# Patient Record
Sex: Female | Born: 1940 | Race: White | Hispanic: No | Marital: Married | Smoking: Former smoker
Health system: Southern US, Community
[De-identification: ages and names within clinical notes are randomized; demographics above are authoritative.]

## PROBLEM LIST (undated history)

## (undated) DIAGNOSIS — Z923 Personal history of irradiation: Secondary | ICD-10-CM

## (undated) DIAGNOSIS — Z803 Family history of malignant neoplasm of breast: Secondary | ICD-10-CM

## (undated) DIAGNOSIS — K219 Gastro-esophageal reflux disease without esophagitis: Secondary | ICD-10-CM

## (undated) DIAGNOSIS — C801 Malignant (primary) neoplasm, unspecified: Secondary | ICD-10-CM

## (undated) DIAGNOSIS — Z808 Family history of malignant neoplasm of other organs or systems: Secondary | ICD-10-CM

## (undated) DIAGNOSIS — M2550 Pain in unspecified joint: Secondary | ICD-10-CM

## (undated) DIAGNOSIS — M1990 Unspecified osteoarthritis, unspecified site: Secondary | ICD-10-CM

## (undated) DIAGNOSIS — Z8042 Family history of malignant neoplasm of prostate: Secondary | ICD-10-CM

## (undated) DIAGNOSIS — T8859XA Other complications of anesthesia, initial encounter: Secondary | ICD-10-CM

## (undated) DIAGNOSIS — M79606 Pain in leg, unspecified: Secondary | ICD-10-CM

## (undated) DIAGNOSIS — R112 Nausea with vomiting, unspecified: Secondary | ICD-10-CM

## (undated) DIAGNOSIS — H547 Unspecified visual loss: Secondary | ICD-10-CM

## (undated) DIAGNOSIS — R635 Abnormal weight gain: Secondary | ICD-10-CM

## (undated) DIAGNOSIS — I8390 Asymptomatic varicose veins of unspecified lower extremity: Secondary | ICD-10-CM

## (undated) DIAGNOSIS — IMO0001 Reserved for inherently not codable concepts without codable children: Secondary | ICD-10-CM

## (undated) DIAGNOSIS — H9190 Unspecified hearing loss, unspecified ear: Secondary | ICD-10-CM

## (undated) DIAGNOSIS — I10 Essential (primary) hypertension: Secondary | ICD-10-CM

## (undated) DIAGNOSIS — Z9889 Other specified postprocedural states: Secondary | ICD-10-CM

## (undated) DIAGNOSIS — Z800 Family history of malignant neoplasm of digestive organs: Secondary | ICD-10-CM

## (undated) DIAGNOSIS — E785 Hyperlipidemia, unspecified: Secondary | ICD-10-CM

## (undated) DIAGNOSIS — C50919 Malignant neoplasm of unspecified site of unspecified female breast: Secondary | ICD-10-CM

## (undated) DIAGNOSIS — T4145XA Adverse effect of unspecified anesthetic, initial encounter: Secondary | ICD-10-CM

## (undated) DIAGNOSIS — C50512 Malignant neoplasm of lower-outer quadrant of left female breast: Principal | ICD-10-CM

## (undated) HISTORY — DX: Abnormal weight gain: R63.5

## (undated) HISTORY — PX: ABDOMINAL HYSTERECTOMY: SHX81

## (undated) HISTORY — DX: Family history of malignant neoplasm of prostate: Z80.42

## (undated) HISTORY — DX: Unspecified visual loss: H54.7

## (undated) HISTORY — DX: Malignant neoplasm of unspecified site of unspecified female breast: C50.919

## (undated) HISTORY — DX: Gastro-esophageal reflux disease without esophagitis: K21.9

## (undated) HISTORY — DX: Essential (primary) hypertension: I10

## (undated) HISTORY — DX: Pain in leg, unspecified: M79.606

## (undated) HISTORY — DX: Reserved for inherently not codable concepts without codable children: IMO0001

## (undated) HISTORY — DX: Pain in unspecified joint: M25.50

## (undated) HISTORY — PX: SHOULDER SURGERY: SHX246

## (undated) HISTORY — DX: Asymptomatic varicose veins of unspecified lower extremity: I83.90

## (undated) HISTORY — DX: Family history of malignant neoplasm of other organs or systems: Z80.8

## (undated) HISTORY — DX: Malignant neoplasm of lower-outer quadrant of left female breast: C50.512

## (undated) HISTORY — PX: TONSILLECTOMY: SUR1361

## (undated) HISTORY — DX: Unspecified hearing loss, unspecified ear: H91.90

## (undated) HISTORY — PX: BLADDER SURGERY: SHX569

## (undated) HISTORY — PX: BREAST EXCISIONAL BIOPSY: SUR124

## (undated) HISTORY — DX: Hyperlipidemia, unspecified: E78.5

## (undated) HISTORY — DX: Family history of malignant neoplasm of digestive organs: Z80.0

## (undated) HISTORY — PX: CHOLECYSTECTOMY: SHX55

## (undated) HISTORY — DX: Family history of malignant neoplasm of breast: Z80.3

## (undated) HISTORY — PX: OTHER SURGICAL HISTORY: SHX169

## (undated) HISTORY — PX: BREAST SURGERY: SHX581

---

## 1998-03-14 ENCOUNTER — Inpatient Hospital Stay (HOSPITAL_COMMUNITY): Admission: EM | Admit: 1998-03-14 | Discharge: 1998-03-15 | Payer: Self-pay | Admitting: Internal Medicine

## 1998-05-20 ENCOUNTER — Ambulatory Visit: Admission: RE | Admit: 1998-05-20 | Discharge: 1998-05-20 | Payer: Self-pay | Admitting: Gynecology

## 1998-10-22 ENCOUNTER — Ambulatory Visit: Admission: RE | Admit: 1998-10-22 | Discharge: 1998-10-22 | Payer: Self-pay | Admitting: Gynecology

## 1999-01-05 ENCOUNTER — Other Ambulatory Visit: Admission: RE | Admit: 1999-01-05 | Discharge: 1999-01-05 | Payer: Self-pay | Admitting: Obstetrics & Gynecology

## 1999-04-14 ENCOUNTER — Ambulatory Visit: Admission: RE | Admit: 1999-04-14 | Discharge: 1999-04-14 | Payer: Self-pay | Admitting: Gynecology

## 1999-07-01 ENCOUNTER — Emergency Department (HOSPITAL_COMMUNITY): Admission: EM | Admit: 1999-07-01 | Discharge: 1999-07-01 | Payer: Self-pay | Admitting: Emergency Medicine

## 1999-07-08 ENCOUNTER — Ambulatory Visit: Admission: RE | Admit: 1999-07-08 | Discharge: 1999-07-08 | Payer: Self-pay | Admitting: Gynecology

## 1999-12-30 ENCOUNTER — Encounter: Payer: Self-pay | Admitting: Obstetrics & Gynecology

## 1999-12-30 ENCOUNTER — Encounter: Admission: RE | Admit: 1999-12-30 | Discharge: 1999-12-30 | Payer: Self-pay | Admitting: Obstetrics & Gynecology

## 2000-01-14 ENCOUNTER — Other Ambulatory Visit: Admission: RE | Admit: 2000-01-14 | Discharge: 2000-01-14 | Payer: Self-pay | Admitting: Obstetrics & Gynecology

## 2000-07-06 ENCOUNTER — Ambulatory Visit: Admission: RE | Admit: 2000-07-06 | Discharge: 2000-07-06 | Payer: Self-pay | Admitting: Gynecology

## 2001-02-07 ENCOUNTER — Other Ambulatory Visit: Admission: RE | Admit: 2001-02-07 | Discharge: 2001-02-07 | Payer: Self-pay | Admitting: Obstetrics & Gynecology

## 2001-07-12 ENCOUNTER — Ambulatory Visit: Admission: RE | Admit: 2001-07-12 | Discharge: 2001-07-12 | Payer: Self-pay | Admitting: Gynecology

## 2001-10-03 ENCOUNTER — Ambulatory Visit (HOSPITAL_BASED_OUTPATIENT_CLINIC_OR_DEPARTMENT_OTHER): Admission: RE | Admit: 2001-10-03 | Discharge: 2001-10-03 | Payer: Self-pay | Admitting: Urology

## 2002-02-09 ENCOUNTER — Other Ambulatory Visit: Admission: RE | Admit: 2002-02-09 | Discharge: 2002-02-09 | Payer: Self-pay | Admitting: Obstetrics & Gynecology

## 2003-02-11 ENCOUNTER — Other Ambulatory Visit: Admission: RE | Admit: 2003-02-11 | Discharge: 2003-02-11 | Payer: Self-pay | Admitting: Obstetrics and Gynecology

## 2004-02-14 ENCOUNTER — Other Ambulatory Visit: Admission: RE | Admit: 2004-02-14 | Discharge: 2004-02-14 | Payer: Self-pay | Admitting: Obstetrics & Gynecology

## 2004-03-23 ENCOUNTER — Ambulatory Visit (HOSPITAL_COMMUNITY): Admission: RE | Admit: 2004-03-23 | Discharge: 2004-03-23 | Payer: Self-pay | Admitting: Gastroenterology

## 2004-03-23 ENCOUNTER — Encounter (INDEPENDENT_AMBULATORY_CARE_PROVIDER_SITE_OTHER): Payer: Self-pay | Admitting: Specialist

## 2004-10-07 ENCOUNTER — Emergency Department (HOSPITAL_COMMUNITY): Admission: EM | Admit: 2004-10-07 | Discharge: 2004-10-07 | Payer: Self-pay | Admitting: Emergency Medicine

## 2005-03-08 ENCOUNTER — Other Ambulatory Visit: Admission: RE | Admit: 2005-03-08 | Discharge: 2005-03-08 | Payer: Self-pay | Admitting: Obstetrics & Gynecology

## 2005-11-16 ENCOUNTER — Encounter: Admission: RE | Admit: 2005-11-16 | Discharge: 2005-11-16 | Payer: Self-pay | Admitting: Orthopedic Surgery

## 2006-07-31 ENCOUNTER — Emergency Department (HOSPITAL_COMMUNITY): Admission: EM | Admit: 2006-07-31 | Discharge: 2006-07-31 | Payer: Self-pay | Admitting: Emergency Medicine

## 2007-04-26 ENCOUNTER — Ambulatory Visit: Payer: Self-pay | Admitting: Vascular Surgery

## 2007-05-19 ENCOUNTER — Ambulatory Visit: Payer: Self-pay | Admitting: Vascular Surgery

## 2007-08-04 ENCOUNTER — Ambulatory Visit: Payer: Self-pay | Admitting: Vascular Surgery

## 2007-09-13 ENCOUNTER — Ambulatory Visit: Payer: Self-pay | Admitting: Vascular Surgery

## 2007-09-20 ENCOUNTER — Ambulatory Visit: Payer: Self-pay | Admitting: Vascular Surgery

## 2007-11-10 ENCOUNTER — Ambulatory Visit: Payer: Self-pay | Admitting: Vascular Surgery

## 2010-01-01 ENCOUNTER — Inpatient Hospital Stay (HOSPITAL_COMMUNITY): Admission: EM | Admit: 2010-01-01 | Discharge: 2010-01-02 | Payer: Self-pay | Admitting: Internal Medicine

## 2010-01-02 ENCOUNTER — Ambulatory Visit: Payer: Self-pay | Admitting: Vascular Surgery

## 2010-01-02 ENCOUNTER — Encounter: Payer: Self-pay | Admitting: Gastroenterology

## 2010-04-15 ENCOUNTER — Emergency Department (HOSPITAL_COMMUNITY): Admission: EM | Admit: 2010-04-15 | Discharge: 2010-04-15 | Payer: Self-pay | Admitting: Emergency Medicine

## 2010-09-30 ENCOUNTER — Ambulatory Visit (INDEPENDENT_AMBULATORY_CARE_PROVIDER_SITE_OTHER): Payer: Medicare Other | Admitting: Vascular Surgery

## 2010-09-30 ENCOUNTER — Encounter (INDEPENDENT_AMBULATORY_CARE_PROVIDER_SITE_OTHER): Payer: Medicare Other

## 2010-09-30 DIAGNOSIS — I83893 Varicose veins of bilateral lower extremities with other complications: Secondary | ICD-10-CM

## 2010-10-01 NOTE — Assessment & Plan Note (Signed)
OFFICE VISIT  Young, [name] DOB:  [birth year]                                       09/30/2010 ZOXWR#:[id number]  The patient presents today for evaluation of right calf discomfort.  She is known to me from a prior laser ablation of her small saphenous vein on the right side in February of 2009.  She had had discomfort in her calf but she reports this was completely relieved after this treatment. Over the past proximally 2 months, she has had recurrence of aching sensation in her right calf.  She reports this is most prominent when she is standing for prolonged periods of time, and she does have difficulty with aching in her legs at night after being up on her feet for a great deal during the day..  She does not have a history of DVT. She has been wearing her compression knee high  since the recurrent symptoms with some mild relief from this.  PAST HISTORY:  Significant for hypertension and elevated cholesterol.  SOCIAL HISTORY:  She is married with 3 children.  She is retired.  She quit smoking in 1968.  She des not drink alcohol.  REVIEW OF SYSTEMS:  Positive for some weight gain and pain in her legs as described in her HPI. GI:  Positive for reflux, hiatal hernia, constipation. ENT:  For change in eyesight and hearing. MUSCULOSKELETAL:  For joint pain. Review of systems is otherwise negative.  PHYSICAL EXAMINATION:  Well developed, well nourished white female appearing stated age in no acute stress.  Blood pressure 163/83, pulse 64, respirations 18.  HEENT:  Normal.  Musculoskeletal:  Shows no major deformities or cyanosis.  Neurologic:  No focal weakness or paresthesias.  Skin:  Without ulcers or rashes.  She does have 2+ radial and 2+ dorsalis pedis pulses bilaterally.  She does have some scattered telangiectasia but no large varicosities.  She did undergo repeat duplex and this showed closure of her small saphenous vein on the right.  She did  have a small area just below the popliteal space with some flow in the small saphenous vein.  This is not distended.  Her great saphenous vein has reflux throughout its course. I discussed options with this patient.  We have fitted her with thigh- high compression garments and instructed her on use of these.  We plan to see her again in 3 months to see if she is having successful treatment with this.  She would be a candidate for laser ablation of her great saphenous vein should she have continued symptoms of venous hypertension.  We will see her again for further discussion and treatments.    Larina Earthly, M.D. Electronically Signed  TFE/MEDQ  D:  09/30/2010  T:  10/01/2010  Job:  [id number]  cc:   Barry Dienes. Eloise Harman, M.D.

## 2010-10-07 NOTE — Procedures (Unsigned)
LOWER EXTREMITY VENOUS REFLUX EXAM  INDICATION:  Painful right lower extremity.  EXAM:  Using color-flow imaging and pulse Doppler spectral analysis, the right common femoral, superficial femoral, popliteal, posterior tibial, greater and lesser saphenous veins are evaluated.  There is evidence suggesting deep venous insufficiency in the common femoral, femoral, and popliteal veins of the right lower extremity.  The right saphenofemoral junction is not competent with Reflux of >551milliseconds. The right GSV and its branches are not competent with Reflux of >543milliseconds and the caliber as described below.  The right small saphenous vein demonstrates incompetency in the proximal portion.  The mid portion is noncompressible due to ablation in 2009.  GSV Diameter (used if found to be incompetent only)                                           Right    Left Proximal Greater Saphenous Vein           0.71 cm  cm Proximal-to-mid-thigh                     0.27 cm  cm Mid thigh                                 0.28 cm  cm Mid-distal thigh                          cm       cm Distal thigh                              0.29 cm  cm Knee                                      0.31 cm  cm  IMPRESSION: 1. The right greater saphenous vein and its proximal branches are not     competent with reflux >522milliseconds. 2. The right greater saphenous vein is not tortuous. 3. The deep venous system is not competent with Reflux of     >52milliseconds. 4. The right small saphenous vein is not competent in the proximal     portion. 5. Evidence of small cystic structure in the popliteal space, likely a     Baker's cyst.  ___________________________________________ Larina Earthly, M.D.  LT/MEDQ  D:  09/30/2010  T:  09/30/2010  Job:  [id number]

## 2010-10-08 LAB — CBC
HCT: 41.3 % (ref 36.0–46.0)
Hemoglobin: 14.8 g/dL (ref 12.0–15.0)
MCH: 31 pg (ref 26.0–34.0)
MCHC: 35.8 g/dL (ref 30.0–36.0)
MCV: 86.4 fL (ref 78.0–100.0)
Platelets: 209 10*3/uL (ref 150–400)
RBC: 4.78 MIL/uL (ref 3.87–5.11)
RDW: 12 % (ref 11.5–15.5)
WBC: 12.6 10*3/uL — ABNORMAL HIGH (ref 4.0–10.5)

## 2010-10-08 LAB — COMPREHENSIVE METABOLIC PANEL
ALT: 33 U/L (ref 0–35)
AST: 34 U/L (ref 0–37)
Albumin: 4.6 g/dL (ref 3.5–5.2)
Alkaline Phosphatase: 108 U/L (ref 39–117)
BUN: 15 mg/dL (ref 6–23)
CO2: 28 mEq/L (ref 19–32)
Calcium: 9.9 mg/dL (ref 8.4–10.5)
Chloride: 105 mEq/L (ref 96–112)
Creatinine, Ser: 0.87 mg/dL (ref 0.4–1.2)
GFR calc Af Amer: >60 mL/min (ref >60)
GFR calc non Af Amer: >60 mL/min (ref >60)
Glucose, Bld: 167 mg/dL — ABNORMAL HIGH (ref 70–99)
Potassium: 4.2 mEq/L (ref 3.5–5.1)
Sodium: 141 mEq/L (ref 135–145)
Total Bilirubin: 0.6 mg/dL (ref 0.3–1.2)
Total Protein: 7.9 g/dL (ref 6.0–8.3)

## 2010-10-08 LAB — DIFFERENTIAL
Basophils Absolute: 0 10*3/uL (ref 0.0–0.1)
Basophils Relative: 0 % (ref 0–1)
Eosinophils Absolute: 0 10*3/uL (ref 0.0–0.7)
Eosinophils Relative: 0 % (ref 0–5)
Lymphocytes Relative: 11 % — ABNORMAL LOW (ref 12–46)
Lymphs Abs: 1.3 10*3/uL (ref 0.7–4.0)
Monocytes Absolute: 0.5 10*3/uL (ref 0.1–1.0)
Monocytes Relative: 4 % (ref 3–12)
Neutro Abs: 10.7 10*3/uL — ABNORMAL HIGH (ref 1.7–7.7)
Neutrophils Relative %: 85 % — ABNORMAL HIGH (ref 43–77)

## 2010-10-08 LAB — URINALYSIS, ROUTINE W REFLEX MICROSCOPIC
Bilirubin Urine: NEGATIVE
Glucose, UA: NEGATIVE mg/dL
Hgb urine dipstick: NEGATIVE
Ketones, ur: NEGATIVE mg/dL
Nitrite: NEGATIVE
Protein, ur: NEGATIVE mg/dL
Specific Gravity, Urine: 1.014 (ref 1.005–1.030)
Urobilinogen, UA: 1 mg/dL (ref 0.0–1.0)
pH: 7.5 (ref 5.0–8.0)

## 2010-10-08 LAB — LIPASE, BLOOD: Lipase: 19 U/L (ref 11–59)

## 2010-10-12 LAB — CBC
HCT: 37 % (ref 36.0–46.0)
HCT: 39.9 % (ref 36.0–46.0)
Hemoglobin: 12.5 g/dL (ref 12.0–15.0)
Hemoglobin: 13.8 g/dL (ref 12.0–15.0)
MCHC: 33.7 g/dL (ref 30.0–36.0)
MCHC: 34.5 g/dL (ref 30.0–36.0)
MCV: 89.1 fL (ref 78.0–100.0)
MCV: 90.1 fL (ref 78.0–100.0)
Platelets: 171 10*3/uL (ref 150–400)
Platelets: 212 10*3/uL (ref 150–400)
RBC: 4.1 MIL/uL (ref 3.87–5.11)
RBC: 4.47 MIL/uL (ref 3.87–5.11)
RDW: 12.2 % (ref 11.5–15.5)
RDW: 12.3 % (ref 11.5–15.5)
WBC: 6 10*3/uL (ref 4.0–10.5)
WBC: 6.2 10*3/uL (ref 4.0–10.5)

## 2010-10-12 LAB — COMPREHENSIVE METABOLIC PANEL
ALT: 73 U/L — ABNORMAL HIGH (ref 0–35)
AST: 75 U/L — ABNORMAL HIGH (ref 0–37)
Albumin: 4.7 g/dL (ref 3.5–5.2)
Alkaline Phosphatase: 135 U/L — ABNORMAL HIGH (ref 39–117)
BUN: 15 mg/dL (ref 6–23)
CO2: 29 mEq/L (ref 19–32)
Calcium: 9.8 mg/dL (ref 8.4–10.5)
Chloride: 105 mEq/L (ref 96–112)
Creatinine, Ser: 0.88 mg/dL (ref 0.4–1.2)
GFR calc Af Amer: >60 mL/min (ref >60)
GFR calc non Af Amer: >60 mL/min (ref >60)
Glucose, Bld: 93 mg/dL (ref 70–99)
Potassium: 4.3 mEq/L (ref 3.5–5.1)
Sodium: 140 mEq/L (ref 135–145)
Total Bilirubin: 0.7 mg/dL (ref 0.3–1.2)
Total Protein: 7.7 g/dL (ref 6.0–8.3)

## 2010-10-12 LAB — CARDIAC PANEL(CRET KIN+CKTOT+MB+TROPI)
CK, MB: 1.1 ng/mL (ref 0.3–4.0)
CK, MB: 1.2 ng/mL (ref 0.3–4.0)
CK, MB: 1.6 ng/mL (ref 0.3–4.0)
Relative Index: 1.6 (ref 0.0–2.5)
Relative Index: INVALID (ref 0.0–2.5)
Relative Index: INVALID (ref 0.0–2.5)
Total CK: 101 U/L (ref 7–177)
Total CK: 70 U/L (ref 7–177)
Total CK: 87 U/L (ref 7–177)
Troponin I: 0.01 ng/mL (ref 0.00–0.06)
Troponin I: 0.02 ng/mL (ref 0.00–0.06)
Troponin I: 0.03 ng/mL (ref 0.00–0.06)

## 2010-10-12 LAB — PROTIME-INR
INR: 0.92 (ref 0.00–1.49)
Prothrombin Time: 12.3 seconds (ref 11.6–15.2)

## 2010-10-12 LAB — BRAIN NATRIURETIC PEPTIDE: Pro B Natriuretic peptide (BNP): 109 pg/mL — ABNORMAL HIGH (ref 0.0–100.0)

## 2010-10-12 LAB — APTT: aPTT: 29 seconds (ref 24–37)

## 2010-10-12 LAB — HEPARIN LEVEL (UNFRACTIONATED)
Heparin Unfractionated: 0.15 IU/mL — ABNORMAL LOW (ref 0.30–0.70)
Heparin Unfractionated: 0.39 IU/mL (ref 0.30–0.70)

## 2010-10-12 LAB — D-DIMER, QUANTITATIVE: D-Dimer, Quant: 0.73 ug/mL-FEU — ABNORMAL HIGH (ref 0.00–0.48)

## 2010-12-08 NOTE — Consult Note (Signed)
NEW PATIENT CONSULTATION   [name], [name]  DOB:  Dec 21, 1940                                       05/19/2007  WJXBJ#:[id number]   The patient presents today for evaluation of painful varicosities in her  right posterior calf.  She is an active, healthy, 70 year old white  female who reports pain with prolonged standing and really with any  activity in her right posterior calf.  She has a prominent varicosity  overt his area and has pain related to this.  She does also have  swelling more so on her right leg than on her left leg.  She has no  history of deep venous thrombosis, thrombophlebitis, or bleeding.  She  was given graduated compression stockings by Dr. Jarome Matin 3 weeks  ago.  She reports some improvement in the pain but has a very difficult  time putting these on and continues to have some discomfort related to  this.  I have encouraged her to continue her use of the compression  garments to to determine if this is satisfactory.   PAST HISTORY:  Significant for:  1. Hypertension.  2. Elevated cholesterol.  3. She does have history of ovarian cancer in 1998 with treatment.  4. She has no history of cardiac disease or diabetes.   SOCIAL HISTORY:  She is married with 3 children.  She does not smoke,  having quit in 1968, and does not drink alcohol on a regular basis.   REVIEW OF SYSTEMS:  Completely negative aside from her leg pain.   ALLERGIES:  1. CODEINE.  2. MORPHINE.   MEDICATIONS:  1. Lipitor.  2. Atenolol.  3. Nexium.   PHYSICAL EXAM:  Well-developed, well-nourished, white female appearing  stated age of 37.  Blood pressure is 163/93.  Pulse 66.  Respirations  16.  O2 saturations 98% on room air.  She has 2+ radial and 2+ dorsalis  pedis pulses bilaterally.  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  She does have a large varix in the right posterior calf.   On duplex imaging of this, this arises from an incompetent  small  saphenous vein and also has communication with her great saphenous vein.  Great saphenous vein does appear to be patent.  She had undergone a  formal duplex and I have reviewed this with her from our office on  04/26/07.  This reveals no evidence of deep venous valvular  incompetence.  I have encouraged the patient to continue to use her  compression garment as she is currently doing.  We will see her again in  2 months to determine if this conservative treatment is working.  She  will continue with her elevation and ibuprofen as needed for pain.  She  does not like taking pain medication and we agreed with this.  We will  see her in two weeks for continued discussion.   Larina Earthly, M.D.  Electronically Signed   TFE/MEDQ  D:  05/19/2007  T:  05/22/2007  Job:  [id number]   cc:   Barry Dienes. Eloise Harman, M.D.

## 2010-12-08 NOTE — Assessment & Plan Note (Signed)
OFFICE VISIT   [name], [name]  DOB:  1941/03/25                                       08/04/2007  [id number]#:[id number]   The patient presents today for continued followup of her right leg  venous hypertension.  She has been extremely compliant with her  graduated compression garments.  She reports that this has not improved  her symptoms.  She reports that she does continue to have pain with  prolonged standing over the varicosities of her right popliteal space.  She reports at the end of the day that this does cause a difficulty with  her exercise and walking program.   PHYSICAL EXAM:  Unchanged.  Blood pressure 164/79, pulse 69,  respirations 18.  Her dorsalis pedis pulses are 2+ bilaterally.  Her  popliteal fossa shows an area of varicosities arising from both her  small saphenous vein, which is incompetent.  She also had some  communication with these with her greater saphenous vein.  Her greater  saphenous vein is competent from the area of her knee proximally, but  she does have some incompetence in the calf as well.  Discussed options  with the patient.  Appears that the small saphenous vein appears to be  the main culprit in causing her venous hypertension.  I have recommended  laser ablation of her small saphenous vein on the right and tributary  stab phlebectomy of the tributary varicosities in the same setting.  I  explained this will be done as an outpatient under local anesthesia.  She understands and wishes to proceed once we have assured insurance  coverage.   Larina Earthly, M.D.  Electronically Signed   TFE/MEDQ  D:  08/04/2007  T:  08/07/2007  Job:  [id number]   cc:   Barry Dienes. Eloise Harman, M.D.

## 2010-12-08 NOTE — Procedures (Signed)
DUPLEX DEEP VENOUS EXAM - LOWER EXTREMITY   INDICATION:  Right lower extremity pain and swelling for about three  weeks.   HISTORY:  Edema:  No.  Trauma/Surgery:  No.  Pain:  Right lower extremity.  PE:  No.  Previous DVT:  Right upper extremity in 1970s.  Anticoagulants:  Aspirin.  Other:   DUPLEX EXAM:                CFV   SFV   PopV  PTV    GSV                R  L  R  L  R  L  R   L  R  L  Thrombosis    0  0  0     0     0      0  Spontaneous   +  +  +     +     +      +  Phasic        +  +  +     +     +      +  Augmentation  +  +  +     +     +      +  Compressible  +  +  +     +     +      +  Competent     +  +  +     +     +      D   Legend:  + - yes  o - no  p - partial  D - decreased   IMPRESSION:  1. The right lower extremity shows no evidence of deep venous      thrombosis.  2. The left common femoral vein shows no evidence of deep venous      thrombosis.  3. 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  4. Tortuous varicosities noted in the posterior right calf off of the      right lesser saphenous vein with      connections to the right greater saphenous vein and the right      gastrocnemius vein.  5. Right lesser saphenous vein shows evidence of insufficiency.    _____________________________  Larina Earthly, M.D.   AS/MEDQ  D:  04/26/2007  T:  04/27/2007  Job:  [id number]

## 2010-12-08 NOTE — Assessment & Plan Note (Signed)
OFFICE VISIT   Katrina [name], Sinai L  DOB:  14-Jan-1941                                       11/10/2007  DGLOV#:[id number]   The patient presents today for followup of her laser ablation for small  saphenous vein in her right leg with a stab phlebectomy on 09/20/2007.  She continues to do quite well.  She has been up the last 2 nights  caring for her husband, who just had prostate surgery, but reports that  he is recovering nicely.  She does have marked improvement in the  discomfort in her right calf.  She does not have any evidence of  varicosities, and on duplex imaging, does have continued closure of her  small saphenous vein.  I am quite pleased with her initial result, as is  the patient.  She will see [place] again on an as needed basis.   Larina Earthly, M.D.  Electronically Signed   TFE/MEDQ  D:  11/10/2007  T:  11/13/2007  Job:  [id number]   cc:   Barry Dienes. Eloise Harman, M.D.

## 2010-12-08 NOTE — Assessment & Plan Note (Signed)
OFFICE VISIT   Billy, Naylah L  DOB:  January 02, 1941                                       09/20/2007  ZOXWR#:[id number]   SUMMARY:  The patient returns today for 1 week followup of laser  ablation of her right small saphenous vein and tributary stab  phlebectomies.  She did well and reports no discomfort following her  procedure.  She has mild bruising and otherwise no changes.  She  underwent limited venous duplex today and this reveals a complete  closure of her small saphenous vein from distal calf up to the  saphenopopliteal junction.  The popliteal vein is widely patent with no  evidence of thrombus.   I am quite pleased with the patient's initial result.  She will continue  her usual activity and we plan to see her again in 6 weeks for final  followup.   Larina Earthly, M.D.  Electronically Signed   TFE/MEDQ  D:  09/20/2007  T:  09/22/2007  Job:  [id number]   cc:   Barry Dienes. Eloise Harman, M.D.

## 2010-12-11 NOTE — Op Note (Signed)
[facility]  Patient:    Katrina Young, Katrina Young Visit Number: [id number] MRN: [id number]          Service Type: NES Location: NESC Attending Physician:  Tania Ade Dictated by:   Lucrezia Starch. Ovidio Hanger, M.D. Proc. 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  OPERATION PERFORMED:  Transvaginal tape and cystourethroscopy.  SURGEON:  Lucrezia Starch. Ovidio Hanger, M.D.  ANESTHESIA:  General endotracheal.  ESTIMATED BLOOD LOSS:  50 cc.  TUBES:  16 French Foley vaginal pack.  COMPLICATIONS:  None.  INDICATIONS FOR PROCEDURE:  [name] is a lovely 70 year old white female who presented with significant stress incontinence and some urge incontinence ______ noted to have descensus to the inferior border of the pubic symphysis with significant beaking. [name] test was grossly positive. She underwent urodynamics which revealed essentially normal urodynamics except that she had significant leakage and had a Valsalva leak point pressure of 40 cm of water. She was felt to have type 2 and primarily type 3 stress urinary incontinence. After considering the risks, benefits, and alternatives, it was elected to proceed with the TVT.  DESCRIPTION OF PROCEDURE:  The patient was placed in the supine position and after proper general endotracheal anesthesia was placed in the dorsal lithotomy position and prepped and draped with Betadine in a sterile fashion. A speculum was placed. The submucosal area was injected with approximately 8 cc of 1% xylocaine with epinephrine. A 16 French Foley catheter was passed and the bladder was drained. The vaginal mucosa was incised in flaps approximately 1 cm and flaps  were created laterally and the pelvic fascia was not perforated. Two punch holes were obtained approximately 1 cm proximal to the pubic symphysis and 2 cm lateral from the midline bilaterally with the TVT punch instrument and delivered lateral to the mid urethra. The attachment was obtained. The TVT was delivered into position and it might be noted that cystourethroscopy was performed prior to this utilizing both the 12 and 70 degree lenses but the bladder carefully completely filled and there were no lesions in the bladder. Ureteral orifices were normal in location. Utilizing a hemostat to prevent undue tension, the tape was deployed and was felt to deploy properly. The mucosa was closed with a running locked #0 Vicryl suture and good hemostasis was noted to be present. Reinspection into the bladder revealed again no perforations utilizing the 12 and 70 degree lenses with the bladder fully distended. The tapes were cut beneath the skin. The skin was closed with Dermabond bilaterally and a 16 French Foley catheter was inserted, a vaginal pack was placed and the patient was taken to the recovery room stable. Dictated by:   Lucrezia Starch. Ovidio Hanger, M.D. Attending Physician:  Tania Ade DD:  10/03/01 TD:  10/03/01 Job: [id number]

## 2010-12-11 NOTE — Consult Note (Signed)
[facility]  Patient:    Katrina Young, Katrina Young Visit Number: [id number] MRN: [id number]          Service Type: GON Location: GYN Attending Physician:  Jeannette Corpus Dictated by:   Rande Brunt. Clarke-Pearson, M.D. Proc. Date: 07/12/01 Admit Date:  07/12/2001   CC:         Freddy Finner, M.D.  Ronald L. Ovidio Hanger, M.D.  Vania Rea. Jarold Motto, M.D. [facility]  John C. Madilyn Fireman, M.D.  Telford Nab, R.N.   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 She received no adjuvant therapy. She has been followed since that time with no evidence of recurrent disease. Her last CA-125 was 5.1.  Since her last visit, the patient has done well. She denies any GI symptoms. She does have urinary frequency, dribbling, and what sounds like an unstable bladder. She apparently has been treated with Detrol but the patients constipation was worsened by Detrol. She is scheduled to see Dr. Darvin Neighbours in the next several weeks for further evaluation.  FAMILY HISTORY:  Family history is reviewed and unchanged.  CURRENT MEDICATIONS:  Unchanged.  REVIEW OF SYSTEMS:  Negative except has noted above.  PHYSICAL EXAMINATION:  VITAL SIGNS:  Weight 148 pounds, blood pressure 150/90.  GENERAL:  The patient is a healthy white female in no acute distress.  HEENT:  Negative.  NECK:  Supple without thyromegaly. There are no supraclavicular or inguinal adenopathy.  ABDOMEN:  Soft, nontender. No mass, organomegaly, ascites, or hernias are noted.  PELVIC:  EGBUS normal. Vagina is clean, well supported. Bimanual and rectovaginal exam reveal no masses, induration, or nodularity.  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 No evidence of recurrent disease with over five years of followup.  PLAN:  A CA-125 will be obtained today.  At  this juncture, I would like to release the patient back to the sole care of Dr. Konrad Dolores. Obviously, I would be happy to see her if there were any problem but I think that we should consider her cured of her early stage ovarian cancer. Dictated by:   Rande Brunt. Clarke-Pearson, M.D. Attending Physician:  Jeannette Corpus DD:  07/12/01 TD:  07/12/01 Job: [id number]

## 2010-12-11 NOTE — Op Note (Signed)
NAME:  Katrina Young, Katrina Young                            ACCOUNT NO.:  [id number]   MEDICAL RECORD NO.:  [id number]                   PATIENT TYPE:  AMB   LOCATION:  ENDO                                 FACILITY:  [facility]   PHYSICIAN:  John C. Madilyn Fireman, M.D.                 DATE OF BIRTH:  [birth year]   DATE OF PROCEDURE:  03/23/2004  DATE OF DISCHARGE:                                 OPERATIVE REPORT   PROCEDURE:  Colonoscopy with polypectomy.   INDICATION FOR PROCEDURE:  Rectal bleeding with no complete colon screening  in the last 7 years in a patient with history of ovarian cancer apparently  cured.   DESCRIPTION OF PROCEDURE:  The patient was placed in the left lateral  decubitus position and placed on the pulse monitor with continuous low-flow  oxygen delivered by nasal cannula.  She was sedated with 62.5 mcg IV  fentanyl and 7 mg IV Versed.  The Olympus video colonoscope was inserted  into the rectum and advanced to the cecum, confirmed by transillumination at  McBurney's point and visualization of the ileocecal valve and appendiceal  orifice.  The prep was excellent.  The cecum appeared normal with no masses,  polyps, diverticula, or other mucosal abnormalities.  Within the ascending  colon, there was an 8 mm sessile polyp fulgurated by hot biopsy.  The  remainder of ascending, transverse, descending, and sigmoid  colon all  appeared normal with no masses, polyps, diverticula, or other mucosal  abnormalities.  The rectum appeared normal, and retroflexed view of the anus  revealed some small internal hemorrhoids.  The scope was then withdrawn and  the patient returned to the recovery room in stable condition.  She  tolerated the procedure well, and there were no immediate complications.   IMPRESSION:  1. Ascending colon polyp.  2. Small internal hemorrhoids.   PLAN:  We will await histology to determine method and interval for future  colon screening.                 John C. Madilyn Fireman, M.D.    JCH/MEDQ  D:  03/23/2004  T:  03/23/2004  Job:  [id number]   cc:   Barry Dienes. Eloise Harman, M.D.  [address]  [place]  [place] [address]  Fax: [phone]

## 2010-12-11 NOTE — Consult Note (Signed)
[facility]  Patient:    Katrina Young, Katrina Young                         MRN: [id number] Proc. Date: 07/06/00 Adm. Date:  [id number] Disc. Date: [id number] Attending:  Jeannette Corpus CC:         Freddy Finner, M.D.  Ronald L. Ovidio Hanger, M.D.  Vania Rea. Jarold Motto, M.D. [facility]  John C. Madilyn Fireman, M.D.  Telford Nab, R.N.   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  Her initial surgical staging and resection were in August 1997.  She received no adjuvant therapy because of her low grade and stage.  She has been followed since that time without any evidence of recurrent disease.  Six months ago, she saw Dr. Lacretia Nicks. Varney Baas and was doing well with no evidence of recurrent disease.  Since her last visit, she has continued to do well.  She denies any GI or GU symptoms, has no pelvic pain or pressure, vaginal bleeding or discharge.  FAMILY HISTORY AND SOCIAL HISTORY:  Reviewed and unchanged.  CURRENT MEDICATIONS:  Unchanged.  PHYSICAL EXAMINATION  VITAL SIGNS:  Weight 146 pounds (stable).  Blood pressure 130/70.  GENERAL:  Patient is a healthy white female in no acute distress.  HEENT:  Negative.  NECK:  Supple without thyromegaly.  NODES:  There is no supraclavicular, axillary or inguinal adenopathy.  ABDOMEN:  Soft and nontender.  No masses, organomegaly, ascites or herniae are noted.  PELVIC:  EG/BUS normal.  Vagina is clean, well-supported.  No lesions are noted.  Bimanual and rectovaginal exams reveal no masses, induration or nodularity.  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  No evidence of recurrent disease.  CA125 will be obtained.  Patient will return to see Dr. Jennette Kettle in six months and return to see me in one year. DD:  07/12/00 TD:  07/13/00 Job: [id number]

## 2010-12-30 ENCOUNTER — Ambulatory Visit (INDEPENDENT_AMBULATORY_CARE_PROVIDER_SITE_OTHER): Payer: Medicare Other | Admitting: Vascular Surgery

## 2010-12-30 DIAGNOSIS — I83893 Varicose veins of bilateral lower extremities with other complications: Secondary | ICD-10-CM

## 2010-12-31 NOTE — Assessment & Plan Note (Signed)
OFFICE VISIT  Jetter, Anahli L DOB:  07-Mar-1941                                       12/30/2010 ZOXWR#:[id number]  This patient presents today for continued discussion regarding venous hypertension in her right leg.  She continues to report pain in her posterior calf and lateral calf with prolonged standing.  She reports that she continues to have discomfort despite the use of very compliant use of graduated compression garments.  She has stopped walking for exercise due to leg pain and now reports she has trouble falling asleep at night due to leg pain after standing on this for a great period of time during the day.  I again reviewed the duplex with her.  This does show reflux in her great saphenous vein throughout its course.  I have recommended laser ablation of her right great saphenous vein for improvement in her venous hypertension symptoms.  She wishes to proceed with this as soon as possible.    Larina Earthly, M.D. Electronically Signed  TFE/MEDQ  D:  12/30/2010  T:  12/31/2010  Job:  [id number]

## 2011-02-02 ENCOUNTER — Encounter: Payer: Self-pay | Admitting: Vascular Surgery

## 2011-02-02 DIAGNOSIS — I83811 Varicose veins of right lower extremities with pain: Secondary | ICD-10-CM | POA: Insufficient documentation

## 2011-02-02 DIAGNOSIS — M79606 Pain in leg, unspecified: Secondary | ICD-10-CM | POA: Insufficient documentation

## 2011-02-10 ENCOUNTER — Other Ambulatory Visit (INDEPENDENT_AMBULATORY_CARE_PROVIDER_SITE_OTHER): Payer: Medicare Other | Admitting: Vascular Surgery

## 2011-02-10 DIAGNOSIS — I83893 Varicose veins of bilateral lower extremities with other complications: Secondary | ICD-10-CM

## 2011-02-11 NOTE — Assessment & Plan Note (Signed)
OFFICE VISIT  Katrina Young, Katrina Young DOB:  02-Aug-1940                                       02/10/2011 NGEXB#:[id number]  This patient presents today for treatment of her venous hypertension in her right leg.  She underwent uneventful right leg laser ablation from the proximal calf up to the level of the saphenofemoral junction with tumescent anesthesia.  She had no immediate complications, was discharged home and will see [place] again in 1 week with venous duplex follow-up.    Larina Earthly, M.D. Electronically Signed  TFE/MEDQ  D:  02/10/2011  T:  02/11/2011  Job:  [id number]

## 2011-02-12 ENCOUNTER — Encounter: Payer: Self-pay | Admitting: Vascular Surgery

## 2011-02-15 ENCOUNTER — Ambulatory Visit: Payer: Medicare Other | Admitting: Vascular Surgery

## 2011-02-16 ENCOUNTER — Encounter: Payer: Self-pay | Admitting: Vascular Surgery

## 2011-02-16 ENCOUNTER — Ambulatory Visit (INDEPENDENT_AMBULATORY_CARE_PROVIDER_SITE_OTHER): Payer: Medicare Other | Admitting: Vascular Surgery

## 2011-02-16 ENCOUNTER — Encounter (INDEPENDENT_AMBULATORY_CARE_PROVIDER_SITE_OTHER): Payer: Medicare Other

## 2011-02-16 VITALS — BP 138/74 | HR 71 | Resp 20 | Ht 64.0 in | Wt 160.0 lb

## 2011-02-16 DIAGNOSIS — Z48812 Encounter for surgical aftercare following surgery on the circulatory system: Secondary | ICD-10-CM

## 2011-02-16 DIAGNOSIS — I8310 Varicose veins of unspecified lower extremity with inflammation: Secondary | ICD-10-CM

## 2011-02-16 DIAGNOSIS — M79609 Pain in unspecified limb: Secondary | ICD-10-CM

## 2011-02-16 DIAGNOSIS — I83893 Varicose veins of bilateral lower extremities with other complications: Secondary | ICD-10-CM

## 2011-02-16 NOTE — Progress Notes (Signed)
One week fu laser ablation R GSV. 

## 2011-02-16 NOTE — Progress Notes (Signed)
Subjective:     Patient ID: Katrina Young, female   DOB: [birth year], 70 y.o.   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 She was experiencing discomfort despite the use of long leg elastic compression stockings preoperatively. She has done well since her procedure with no significant pain. Ibuprofen causes her chest discomfort and she discontinued it. He has been wearing a long leg elastic compression stockings and elevating the leg and discomfort in the thigh is about resolved.  Review of Systems she denies chest pain dyspnea on exertion PND orthopnea hemoptysis or other pulmonary or cardiac symptoms since her procedure.     Objective:   Physical Exam on examination her right lower extremity has 3+ femoral popliteal dorsalis pedis pulse palpable. There is mild discomfort along the course of the great saphenous vein from the distal thigh the saphenofemoral junction area There is no erythema or blistering. No distal edema is noted. The left leg has 3+ dorsalis pedis pulse palpable. Today I ordered a venous duplex exam which are reviewed and interpreted. There is no DVT in the right leg. The right great saphenous vein was totally ablated from the distal thigh to the saphenofemoral junction.    Assessment:    the patient has done well following laser ablation of the right great saphenous vein for venous hypertension.    Plan:     She will continue wearing her last compression stocking for one more week elevate her leg on when necessary basis and return to see [place] as necessary.

## 2011-02-22 NOTE — Procedures (Unsigned)
DUPLEX DEEP VENOUS EXAM - LOWER EXTREMITY  INDICATION:  Follow up right great saphenous vein ablation.  HISTORY:  Edema:  Yes. Trauma/Surgery:  Right GSV ablation, 02/10/2011. Pain:  Tenderness. PE:  No. Previous DVT:  No. Anticoagulants:  No. Other:  DUPLEX EXAM:               CFV   SFV   PopV  PTV    GSV               R  L  R  L  R  L  R   L  R  L Thrombosis    o  o  o     o     o      + Spontaneous   +  +  +     +     +      o Phasic        +  +  +     +     +      o Augmentation  +  +  +     +     +      o Compressible  +  +  +     +     +      o Competent     o  +  o     o     +      +  Legend:  + - yes  o - no  p - partial  D - decreased  IMPRESSION: 1. No evidence of right lower extremity deep venous thrombosis. 2. Successful right great saphenous vein ablation with thrombus noted     from the distal insertion site to the saphenofemoral junction.   _____________________________ Katrina Young [place], M.D.  EM/MEDQ  D:  02/17/2011  T:  02/17/2011  Job:  [id number]

## 2011-07-01 ENCOUNTER — Telehealth: Payer: Self-pay | Admitting: Cardiology

## 2011-07-01 NOTE — Telephone Encounter (Signed)
Son-in-law of Darrell Leonhardt, Maylon Cos, calling re: his father in-law Katrina Young. He states he is having some "heart problems", doesn't know specific problems. Mr. Haliburton is seeing Dr. Wylene Simmer today but Mr. Cay Schillings (who knows Dr. Swaziland personally) wants Mr. Steedley to be seen by Dr. Sw[place]iland if necessary. States Dr. Wylene Simmer knows to call our office if needs an appointment.

## 2011-07-01 NOTE — Telephone Encounter (Signed)
New Msg: Pt son in law calling stating that father in law feels like something is wrong with his heart and he is seeing PCP today and hopefully will start seeing Dr. [place] at father in laws wife (pt) is already a pt of Dr. Sw[place]iland. Please return call to discuss further.

## 2011-09-14 DIAGNOSIS — H524 Presbyopia: Secondary | ICD-10-CM | POA: Diagnosis not present

## 2011-09-14 DIAGNOSIS — H2510 Age-related nuclear cataract, unspecified eye: Secondary | ICD-10-CM | POA: Diagnosis not present

## 2011-09-17 ENCOUNTER — Telehealth: Payer: Self-pay | Admitting: *Deleted

## 2011-09-17 NOTE — Telephone Encounter (Signed)
Patient called stating she is having tingling and a"sticking" sensation at the top of her left leg. She is s/p Laser Ablation of right GSV July,2012. Appointment scheduled 09/23/11 at 11:30

## 2011-09-22 ENCOUNTER — Encounter: Payer: Self-pay | Admitting: Vascular Surgery

## 2011-09-23 ENCOUNTER — Ambulatory Visit (INDEPENDENT_AMBULATORY_CARE_PROVIDER_SITE_OTHER): Payer: Medicare Other | Admitting: Vascular Surgery

## 2011-09-23 ENCOUNTER — Encounter: Payer: Self-pay | Admitting: Vascular Surgery

## 2011-09-23 VITALS — BP 146/77 | HR 62 | Resp 18 | Ht 64.0 in | Wt 165.0 lb

## 2011-09-23 DIAGNOSIS — I83893 Varicose veins of bilateral lower extremities with other complications: Secondary | ICD-10-CM | POA: Diagnosis not present

## 2011-09-23 DIAGNOSIS — M79609 Pain in unspecified limb: Secondary | ICD-10-CM | POA: Diagnosis not present

## 2011-09-23 DIAGNOSIS — M79606 Pain in leg, unspecified: Secondary | ICD-10-CM

## 2011-09-23 NOTE — Progress Notes (Signed)
The patient presents today for evaluation of left leg discomfort. She has 2 components of the discomfort. She is concern regarding a tingling sensation in the lateral aspect of her left proximal thigh and hip. She reports this is a pins and needles and occasionally sharp sensation. This is been present for approximately 2 months. Was not related with any specific activity. He does not have any history of degenerative disc disease in her back. He also has discomfort over the lower calf and ankle over some large telangiectasia in the pretibial area. She is status post ablation of right great saphenous vein for venous hypertension. She's had excellent result from this treatment partially 9 months ago.  Past Medical History  Diagnosis Date  . Weight gain   . Reflux   . Constipation   . 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 Joint pain   . Hiatal hernia   . Hypertension   . Hyperlipidemia   . Varicose veins     History  Substance Use Topics  . Smoking status: Former Smoker -- 2 years    Types: Cigarettes    Quit date: 07/26/1966  . Smokeless tobacco: Never Used  . Alcohol Use: No    Family History  Problem Relation Age of Onset  . Heart disease Mother   . Hypertension Father     Allergies  Allergen Reactions  . Codeine   . Morphine And Related     Current outpatient prescriptions:aspirin 81 MG tablet, Take 81 mg by mouth daily.  , Disp: , Rfl: ;  atenolol (TENORMIN) 25 MG tablet, Take 50 mg by mouth daily. , Disp: , Rfl: ;  esomeprazole (NEXIUM) 40 MG capsule, Take 40 mg by mouth daily before breakfast.  , Disp: , Rfl: ;  LORazepam (ATIVAN) 1 MG tablet, Take 1 mg by mouth as needed.  , Disp: , Rfl: ;  pravastatin (PRAVACHOL) 20 MG tablet, Take 20 mg by mouth daily.  , Disp: , Rfl:  tolterodine (DETROL LA) 4 MG 24 hr capsule, Take 4 mg by mouth daily., Disp: , Rfl: ;  olmesartan (BENICAR) 20 MG tablet, Take 20 mg by mouth daily.  , Disp: ,  Rfl:   BP 146/77  Pulse 62  Resp 18  Ht 5\' 4"  (1.626 m)  Wt 165 lb (74.844 kg)  BMI 28.32 kg/m2  Body mass index is 28.32 kg/(m^2).       Physical exam: Well-developed well-nourished white female in no acute distress. She does have 2+ left dorsalis pedis pulse. She is grossly intact neurologically. She does have telangiectasia which are prominent over her ankle on the left.  I imaged her saphenous vein with SonoSite ultrasound. This showed reflux in the dilated saphenous vein throughout her thigh on the left on the right the area in her distal thigh showed ablation of her saphenous vein from her prior treatment.  Impression and plan: Do not see any evidence for venous or arterial pathology to explain the area of her lateral thigh and hip. I suspect this is neurologic in origin and she is comfortable with continued observation. She does have pain associated with her venous hypertension on the left. She has been compliant with a knee-high compression but she will be (her 20-30 mm thigh high compression garments and we will see her back in 3 months to determine if this is improving her pain at the level of her calf and ankle. She would be a candidate for  ablation at this for

## 2011-11-22 ENCOUNTER — Ambulatory Visit (INDEPENDENT_AMBULATORY_CARE_PROVIDER_SITE_OTHER): Payer: Medicare Other | Admitting: *Deleted

## 2011-11-22 DIAGNOSIS — Z86718 Personal history of other venous thrombosis and embolism: Secondary | ICD-10-CM

## 2011-11-22 DIAGNOSIS — I10 Essential (primary) hypertension: Secondary | ICD-10-CM | POA: Diagnosis not present

## 2011-11-22 DIAGNOSIS — M79609 Pain in unspecified limb: Secondary | ICD-10-CM | POA: Diagnosis not present

## 2011-11-25 NOTE — Procedures (Unsigned)
DUPLEX DEEP VENOUS EXAM - UPPER EXTREMITY  INDICATION:  Right hand numbness and forearm pain for 3 weeks.  HISTORY:  Edema:  No. Trauma/Surgery:  No. Pain:  Upper arm and forearm. PE:  No. Previous DVT:  30 years ago in right arm per patient. Anticoagulants:  None. Other:  DUPLEX EXAM:                                            Bas/               IJV   SCV     AXV    BrachV  Ceph V               R  L  R   L   R  L   R   L   R  L Thrombosis    o  o  o   o   o      o       o Spontaneous   +  +  +   +   +      +       + Phasic        +  +  +   +   +      +       + Augmentation  +  +  +   +   +      +       + Compressible  +  +  +   +   +      +       + Competent     +  +  +   +   +      +       + Legend:  + - yes  o - no  p - partial  D - decreased    IMPRESSION: 1. No evidence of right upper extremity deep vein thrombosis. 2. Results were called to Fannie Knee Drinkard.   ___________________________________________ Larina Earthly, M.D.  EM/MEDQ  D:  11/22/2011  T:  11/22/2011  Job:  [id number]

## 2011-12-08 DIAGNOSIS — E785 Hyperlipidemia, unspecified: Secondary | ICD-10-CM | POA: Diagnosis not present

## 2011-12-08 DIAGNOSIS — Z79899 Other long term (current) drug therapy: Secondary | ICD-10-CM | POA: Diagnosis not present

## 2011-12-08 DIAGNOSIS — I10 Essential (primary) hypertension: Secondary | ICD-10-CM | POA: Diagnosis not present

## 2011-12-15 DIAGNOSIS — Z0000 Encounter for general adult medical examination without abnormal findings: Secondary | ICD-10-CM | POA: Diagnosis not present

## 2011-12-15 DIAGNOSIS — Z1212 Encounter for screening for malignant neoplasm of rectum: Secondary | ICD-10-CM | POA: Diagnosis not present

## 2011-12-15 DIAGNOSIS — R609 Edema, unspecified: Secondary | ICD-10-CM | POA: Diagnosis not present

## 2011-12-15 DIAGNOSIS — I10 Essential (primary) hypertension: Secondary | ICD-10-CM | POA: Diagnosis not present

## 2011-12-15 DIAGNOSIS — E785 Hyperlipidemia, unspecified: Secondary | ICD-10-CM | POA: Diagnosis not present

## 2011-12-22 ENCOUNTER — Other Ambulatory Visit: Payer: Self-pay | Admitting: *Deleted

## 2011-12-22 DIAGNOSIS — I83893 Varicose veins of bilateral lower extremities with other complications: Secondary | ICD-10-CM

## 2011-12-27 ENCOUNTER — Encounter: Payer: Self-pay | Admitting: Vascular Surgery

## 2011-12-28 ENCOUNTER — Other Ambulatory Visit: Payer: Self-pay | Admitting: *Deleted

## 2011-12-28 ENCOUNTER — Ambulatory Visit (INDEPENDENT_AMBULATORY_CARE_PROVIDER_SITE_OTHER): Payer: Medicare Other | Admitting: Vascular Surgery

## 2011-12-28 ENCOUNTER — Encounter (INDEPENDENT_AMBULATORY_CARE_PROVIDER_SITE_OTHER): Payer: Medicare Other | Admitting: *Deleted

## 2011-12-28 ENCOUNTER — Encounter: Payer: Self-pay | Admitting: Vascular Surgery

## 2011-12-28 VITALS — BP 150/71 | HR 56 | Resp 16 | Ht 63.0 in | Wt 163.0 lb

## 2011-12-28 DIAGNOSIS — I83893 Varicose veins of bilateral lower extremities with other complications: Secondary | ICD-10-CM | POA: Diagnosis not present

## 2011-12-28 DIAGNOSIS — M79609 Pain in unspecified limb: Secondary | ICD-10-CM

## 2011-12-28 NOTE — Progress Notes (Signed)
Problems with Activities of Daily Living Secondary to Leg Pain  1. Katrina Young states she has difficulty sleeping due to leg pain.  2. Katrina Young states she has trouble with cooking, cleaning, shopping (any activities that require prolonged standing).     Failure of  Conservative Therapy:  1. Worn 20-30 mm Hg thigh high compression hose >3 months with no relief of symptoms.  2. Frequently elevates legs-no relief of symptoms  3. Taken Ibuprofen 600 Mg TID with no relief of symptoms.  The patient is clearly failed conservative treatment. She did have an excellent symptomatic result from her right leg ablation. She reports similar sensation in the left leg with prolonged standing. Duplex today reveals gross reflux in her great saphenous vein on the left. She does have some reflux in her small saphenous vein but this is not dilated. I have recommended great saphenous vein laser ablation since she has failed conservative treatment. We will proceed this at her earliest convenience

## 2011-12-29 ENCOUNTER — Encounter: Payer: Self-pay | Admitting: Vascular Surgery

## 2011-12-30 ENCOUNTER — Ambulatory Visit (INDEPENDENT_AMBULATORY_CARE_PROVIDER_SITE_OTHER): Payer: Medicare Other | Admitting: Vascular Surgery

## 2011-12-30 ENCOUNTER — Encounter: Payer: Self-pay | Admitting: Vascular Surgery

## 2011-12-30 VITALS — BP 161/76 | HR 56 | Resp 18 | Ht 63.0 in | Wt 161.3 lb

## 2011-12-30 DIAGNOSIS — I83893 Varicose veins of bilateral lower extremities with other complications: Secondary | ICD-10-CM

## 2011-12-30 HISTORY — PX: ENDOVENOUS ABLATION SAPHENOUS VEIN W/ LASER: SUR449

## 2011-12-30 NOTE — Progress Notes (Signed)
Laser Ablation Procedure      Date: 12/30/2011    Katrina Young DOB:[birth year]  Consent signed: Yes  Surgeon:T.F. Zabrina Brotherton  Procedure: Laser Ablation: left Greater Saphenous Vein  BP 161/76  Pulse 56  Resp 18  Ht 5\' 3"  (1.6 m)  Wt 161 lb 4.8 oz (73.165 kg)  BMI 28.57 kg/m2  Start time: 11:00AM   End time: 11:45AM  Tumescent Anesthesia: 450 cc 0.9% NaCl with 50 cc Lidocaine HCL with 1% Epi and 15 cc 8.4% NaHCO3  Local Anesthesia: 4 cc Lidocaine HCL and NaHCO3 (ratio 2:1)  Continuous Mode: 15 Watts Total Energy 2152 Joules Total Time2:23       Patient tolerated procedure well: Yes  Katrina Young, Katrina Young  Description of Procedure:  After marking the course of the saphenous vein and the secondary varicosities in the standing position, the patient was placed on the operating table in the supine position, and the left leg was prepped and draped in sterile fashion. Local anesthetic was administered, and under ultrasound guidance the saphenous vein was accessed with a micro needle and guide wire; then the micro puncture sheath was placed. A guide wire was inserted to the saphenofemoral junction, followed by a 5 french sheath.  The position of the sheath and then the laser fiber below the junction was confirmed using the ultrasound and visualization of the aiming beam.  Tumescent anesthesia was administered along the course of the saphenous vein using ultrasound guidance. Protective laser glasses were placed on the patient, and the laser was fired at at 15 watt continuous mode.  For a total of 2152 joules.  A steri strip was applied to the puncture site.      ABD pads and thigh high compression stockings were applied.  Ace wrap bandages were appliedat the top of the saphenofemoral junction.  Blood loss was less than 15 cc.  The patient ambulated out of the operating room having tolerated the procedure well.

## 2011-12-31 ENCOUNTER — Encounter: Payer: Self-pay | Admitting: Vascular Surgery

## 2012-01-03 ENCOUNTER — Telehealth: Payer: Self-pay | Admitting: *Deleted

## 2012-01-03 NOTE — Telephone Encounter (Signed)
01/03/2012  Time: 9:31 AM   Patient Name: Katrina Young  Patient of: T.F. Early  Procedure:Laser Ablation left  12-30-2011  Reached patient at home and checked  Her status  Yes   [name] states no problems with bleeding/oozing or swelling. States mild discomfort left inner thigh relieved by Ibuprofen 600 mg TID with taken with food.  Reviewed post laser ablation care instructions and reminded her of post laser ablation duplex and follow up with Dr. Arbie Cookey 01-06-2012.  Candy Leverett, Neena Rhymes      @SIGNATURE @

## 2012-01-05 ENCOUNTER — Encounter: Payer: Self-pay | Admitting: Vascular Surgery

## 2012-01-05 NOTE — Procedures (Unsigned)
LOWER EXTREMITY VENOUS REFLUX EXAM  INDICATION:  Painful varicose veins.  EXAM:  Using color-flow imaging and pulse Doppler spectral analysis, the left common femoral, superficial femoral, popliteal, posterior tibial, greater and lesser saphenous veins are evaluated.  There is evidence suggesting deep venous insufficiency in the left common femoral and peroneal veins.  The left saphenofemoral junction is not competent with reflux of >500 milliseconds. The left GSV is not competent with reflux of >500 milliseconds with the caliber as described below.   The left proximal short saphenous vein demonstrates incompetency with the caliber range of 0.31 to 0.42.  GSV Diameter (used if found to be incompetent only)                                           Right    Left Proximal Greater Saphenous Vein           cm       0.91 cm Proximal-to-mid-thigh                     cm       0.48 cm Mid thigh                                 cm       0.52 cm Mid-distal thigh                          cm       cm Distal thigh                              cm       0.54 cm Knee                                      cm       0.52 cm   IMPRESSION: 1. Left great saphenous vein is not competent with reflux >500     milliseconds. 2. The deep venous system is not competent in the common femoral and     peroneal veins. 3. The left small saphenous vein is not competent with reflux of >500     milliseconds.          ___________________________________________ Larina Earthly, M.D.  LT/MEDQ  D:  12/28/2011  T:  12/28/2011  Job:  [id number]

## 2012-01-06 ENCOUNTER — Encounter (INDEPENDENT_AMBULATORY_CARE_PROVIDER_SITE_OTHER): Payer: Medicare Other | Admitting: *Deleted

## 2012-01-06 ENCOUNTER — Ambulatory Visit (INDEPENDENT_AMBULATORY_CARE_PROVIDER_SITE_OTHER): Payer: Medicare Other | Admitting: Vascular Surgery

## 2012-01-06 ENCOUNTER — Encounter: Payer: Self-pay | Admitting: Vascular Surgery

## 2012-01-06 VITALS — BP 162/80 | HR 54 | Resp 18 | Ht 63.0 in | Wt 165.4 lb

## 2012-01-06 DIAGNOSIS — I83893 Varicose veins of bilateral lower extremities with other complications: Secondary | ICD-10-CM

## 2012-01-06 DIAGNOSIS — I8310 Varicose veins of unspecified lower extremity with inflammation: Secondary | ICD-10-CM | POA: Diagnosis not present

## 2012-01-06 DIAGNOSIS — Z48812 Encounter for surgical aftercare following surgery on the circulatory system: Secondary | ICD-10-CM

## 2012-01-06 NOTE — Progress Notes (Signed)
The patient presents today for followup of her left great saphenous vein laser ablation one week ago. She had similar treatment in the past her right leg. She reports minimal discomfort following her procedure and some mild bruising in her left thigh. She does report being compliant with her compression garment.  Physical exam reveals mild bruising in her left medial thigh. She does not have any history of injury to the skin. She has a 2+ dorsalis pedis pulse.  Vascular lab was reviewed with the patient from today in our offices reveal successful ablation of her left great saphenous vein from the distal entry site at the knee to the saphenofemoral junction. There is no evidence of DVT.  Impression and plan successful left great saphenous vein ablation with no evidence of DVT. The patient will be seen again on an as-needed basis. She will wear compression garment for additional week and then as needed

## 2012-01-11 DIAGNOSIS — M899 Disorder of bone, unspecified: Secondary | ICD-10-CM | POA: Diagnosis not present

## 2012-01-11 DIAGNOSIS — M949 Disorder of cartilage, unspecified: Secondary | ICD-10-CM | POA: Diagnosis not present

## 2012-01-13 NOTE — Procedures (Unsigned)
DUPLEX DEEP VENOUS EXAM - LOWER EXTREMITY  INDICATION:  Follow up left GSV ablation.  HISTORY:  Edema:  No. Trauma/Surgery:  Left GSV ablation 12/30/2011. Pain:  No. PE:  No. Previous DVT:  No. Anticoagulants:  No. Other:  DUPLEX EXAM:               CFV   SFV   PopV  PTV     GSV               R  L  R  L  R  L  R   L   R  L Thrombosis       0     0     0      NV     + Spontaneous      +     +     +             0 Phasic           +     +     +             0 Augmentation     +     +     +             0 Compressible     +     +     +             0 Competent        0     +     +             +  Legend:  + - yes  o - no  p - partial  D - decreased  IMPRESSION: 1. Successful ablation of the left great saphenous vein with no flow     noted from the distal insertion site to the saphenofemoral     junction. 2. No evidence of left lower extremity deep vein thrombosis, although     the calf veins were not imaged.   _____________________________ Larina Earthly, M.D.  EM/MEDQ  D:  01/06/2012  T:  01/06/2012  Job:  [id number]

## 2012-04-13 DIAGNOSIS — Z23 Encounter for immunization: Secondary | ICD-10-CM | POA: Diagnosis not present

## 2012-05-09 DIAGNOSIS — Z01419 Encounter for gynecological examination (general) (routine) without abnormal findings: Secondary | ICD-10-CM | POA: Diagnosis not present

## 2012-05-09 DIAGNOSIS — H40019 Open angle with borderline findings, low risk, unspecified eye: Secondary | ICD-10-CM | POA: Diagnosis not present

## 2012-05-09 DIAGNOSIS — Z9189 Other specified personal risk factors, not elsewhere classified: Secondary | ICD-10-CM | POA: Diagnosis not present

## 2012-05-09 DIAGNOSIS — Z1231 Encounter for screening mammogram for malignant neoplasm of breast: Secondary | ICD-10-CM | POA: Diagnosis not present

## 2012-05-09 DIAGNOSIS — N318 Other neuromuscular dysfunction of bladder: Secondary | ICD-10-CM | POA: Diagnosis not present

## 2012-05-09 DIAGNOSIS — Z8543 Personal history of malignant neoplasm of ovary: Secondary | ICD-10-CM | POA: Diagnosis not present

## 2012-09-12 ENCOUNTER — Telehealth: Payer: Self-pay | Admitting: *Deleted

## 2012-09-12 ENCOUNTER — Other Ambulatory Visit: Payer: Self-pay | Admitting: *Deleted

## 2012-09-12 DIAGNOSIS — M79609 Pain in unspecified limb: Secondary | ICD-10-CM

## 2012-09-12 DIAGNOSIS — I83893 Varicose veins of bilateral lower extremities with other complications: Secondary | ICD-10-CM

## 2012-09-12 NOTE — Telephone Encounter (Signed)
Katrina Young is s/p endovenous laser ablation right greater saphenous vein 01/2011.  She is complaining of throbbing, "bad pain" behind her right knee.  She said it has been hurting since January 2014 and has worsened. She states she is currently wearing her thigh high compression stockings and elevating her right leg.  Encouraged her to continue these activities.  Katrina Young states she is unable to take Ibuprofen due to gastric issues. Appointments made for venous reflux exam right leg and follow up appointment with Dr. Arbie Cookey on 09/14/2012 at 9AM/9:45A.  Katrina Young made aware of these appointments and is able to come.

## 2012-09-13 ENCOUNTER — Encounter: Payer: Self-pay | Admitting: Vascular Surgery

## 2012-09-14 ENCOUNTER — Ambulatory Visit (INDEPENDENT_AMBULATORY_CARE_PROVIDER_SITE_OTHER): Payer: Medicare Other | Admitting: Vascular Surgery

## 2012-09-14 ENCOUNTER — Encounter: Payer: Self-pay | Admitting: Cardiology

## 2012-09-14 ENCOUNTER — Encounter: Payer: Self-pay | Admitting: Vascular Surgery

## 2012-09-14 VITALS — BP 133/79 | HR 52 | Resp 18 | Ht 63.0 in | Wt 165.0 lb

## 2012-09-14 DIAGNOSIS — M79609 Pain in unspecified limb: Secondary | ICD-10-CM | POA: Diagnosis not present

## 2012-09-14 DIAGNOSIS — I83893 Varicose veins of bilateral lower extremities with other complications: Secondary | ICD-10-CM

## 2012-09-14 NOTE — Progress Notes (Signed)
Right lower extremity venous duplex performed @ [facility] 09/14/2012

## 2012-09-14 NOTE — Progress Notes (Signed)
The patient presents today for evaluation of right leg discomfort. She is well-known to me from prior laser ablation of her right great saphenous vein. She reports she has had pain for approximately one month his mainly behind her knee in the popliteal fossa and she is anicteric catheters achy sensation at night. She does report some discomfort related to her in patella as well. She is at no increased swelling and the varicosities.  Past Medical History  Diagnosis Date  . Weight gain   . Reflux   . Constipation   . 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 Joint pain   . Hiatal hernia   . Hypertension   . Hyperlipidemia   . Varicose veins   . Leg pain     History  Substance Use Topics  . Smoking status: Former Smoker -- 2 years    Types: Cigarettes    Quit date: 07/26/1966  . Smokeless tobacco: Never Used  . Alcohol Use: No    Family History  Problem Relation Age of Onset  . Heart disease Mother   . Hypertension Father     Allergies  Allergen Reactions  . Codeine     Severe nausea  . Morphine And Related     Current outpatient prescriptions:aspirin 81 MG tablet, Take 81 mg by mouth daily. Takes 2 tablets daily., Disp: , Rfl: ;  atenolol (TENORMIN) 25 MG tablet, Take 50 mg by mouth daily. , Disp: , Rfl: ;  esomeprazole (NEXIUM) 40 MG capsule, Take 40 mg by mouth daily before breakfast.  , Disp: , Rfl: ;  LORazepam (ATIVAN) 1 MG tablet, Take 1 mg by mouth as needed.  , Disp: , Rfl:  losartan (COZAAR) 100 MG tablet, Take 100 mg by mouth daily., Disp: , Rfl: ;  olmesartan (BENICAR) 20 MG tablet, Take 20 mg by mouth daily.  , Disp: , Rfl: ;  pravastatin (PRAVACHOL) 20 MG tablet, Take 20 mg by mouth daily.  , Disp: , Rfl: ;  ibuprofen (ADVIL,MOTRIN) 600 MG tablet, Take 600 mg by mouth every 6 (six) hours as needed., Disp: , Rfl: ;  tolterodine (DETROL LA) 4 MG 24 hr capsule, Take 4 mg by mouth daily., Disp: , Rfl:   BP 133/79  Pulse 52   Resp 18  Ht 5\' 3"  (1.6 m)  Wt 165 lb (74.844 kg)  BMI 29.24 kg/m2  SpO2 98%  Body mass index is 29.24 kg/(m^2).       Physical exam: Well-developed well-nourished white female no acute distress Palpable right dorsalis pedis pulse Scattered slight telangiectasia but no evidence of varicosities and no evidence of swelling in her right leg  Skin without ulcers or rashes  Venous duplex shows successful ablation of her great saphenous vein. She does have several accessory branches in her thigh No evidence of DVT or deep venous reflux  Impression and plan: No evidence of significant venous pathology to explain discomfort in her leg. She was reassured that there was no evidence of DVT. She does have some orthopedic issues regarding her right knee and will see Dr.Caffrey for further evaluation and she will see [place] on an as-needed basis

## 2012-09-19 DIAGNOSIS — H04129 Dry eye syndrome of unspecified lacrimal gland: Secondary | ICD-10-CM | POA: Diagnosis not present

## 2012-12-22 DIAGNOSIS — M899 Disorder of bone, unspecified: Secondary | ICD-10-CM | POA: Diagnosis not present

## 2012-12-22 DIAGNOSIS — I10 Essential (primary) hypertension: Secondary | ICD-10-CM | POA: Diagnosis not present

## 2012-12-22 DIAGNOSIS — M949 Disorder of cartilage, unspecified: Secondary | ICD-10-CM | POA: Diagnosis not present

## 2012-12-22 DIAGNOSIS — E785 Hyperlipidemia, unspecified: Secondary | ICD-10-CM | POA: Diagnosis not present

## 2012-12-26 DIAGNOSIS — H04129 Dry eye syndrome of unspecified lacrimal gland: Secondary | ICD-10-CM | POA: Diagnosis not present

## 2012-12-26 DIAGNOSIS — R300 Dysuria: Secondary | ICD-10-CM | POA: Diagnosis not present

## 2012-12-26 DIAGNOSIS — R82998 Other abnormal findings in urine: Secondary | ICD-10-CM | POA: Diagnosis not present

## 2012-12-29 DIAGNOSIS — I10 Essential (primary) hypertension: Secondary | ICD-10-CM | POA: Diagnosis not present

## 2012-12-29 DIAGNOSIS — M899 Disorder of bone, unspecified: Secondary | ICD-10-CM | POA: Diagnosis not present

## 2012-12-29 DIAGNOSIS — Z79899 Other long term (current) drug therapy: Secondary | ICD-10-CM | POA: Diagnosis not present

## 2012-12-29 DIAGNOSIS — M25569 Pain in unspecified knee: Secondary | ICD-10-CM | POA: Diagnosis not present

## 2012-12-29 DIAGNOSIS — Z1212 Encounter for screening for malignant neoplasm of rectum: Secondary | ICD-10-CM | POA: Diagnosis not present

## 2012-12-29 DIAGNOSIS — R609 Edema, unspecified: Secondary | ICD-10-CM | POA: Diagnosis not present

## 2012-12-29 DIAGNOSIS — Z1331 Encounter for screening for depression: Secondary | ICD-10-CM | POA: Diagnosis not present

## 2012-12-29 DIAGNOSIS — M949 Disorder of cartilage, unspecified: Secondary | ICD-10-CM | POA: Diagnosis not present

## 2012-12-29 DIAGNOSIS — E785 Hyperlipidemia, unspecified: Secondary | ICD-10-CM | POA: Diagnosis not present

## 2012-12-29 DIAGNOSIS — Z0000 Encounter for general adult medical examination without abnormal findings: Secondary | ICD-10-CM | POA: Diagnosis not present

## 2013-04-19 DIAGNOSIS — Z23 Encounter for immunization: Secondary | ICD-10-CM | POA: Diagnosis not present

## 2013-05-15 DIAGNOSIS — Z1231 Encounter for screening mammogram for malignant neoplasm of breast: Secondary | ICD-10-CM | POA: Diagnosis not present

## 2013-05-15 DIAGNOSIS — Z01419 Encounter for gynecological examination (general) (routine) without abnormal findings: Secondary | ICD-10-CM | POA: Diagnosis not present

## 2013-05-15 DIAGNOSIS — Z8543 Personal history of malignant neoplasm of ovary: Secondary | ICD-10-CM | POA: Diagnosis not present

## 2013-06-25 DIAGNOSIS — N3946 Mixed incontinence: Secondary | ICD-10-CM | POA: Diagnosis not present

## 2013-06-25 DIAGNOSIS — R350 Frequency of micturition: Secondary | ICD-10-CM | POA: Diagnosis not present

## 2013-07-15 ENCOUNTER — Emergency Department (HOSPITAL_COMMUNITY)
Admission: EM | Admit: 2013-07-15 | Discharge: 2013-07-15 | Disposition: A | Discharge status: Home / Self Care | Payer: Medicare Other | Source: Home / Self Care | Attending: Emergency Medicine | Admitting: Emergency Medicine

## 2013-07-15 ENCOUNTER — Encounter (HOSPITAL_COMMUNITY): Payer: Self-pay | Admitting: Emergency Medicine

## 2013-07-15 DIAGNOSIS — R040 Epistaxis: Secondary | ICD-10-CM | POA: Insufficient documentation

## 2013-07-15 DIAGNOSIS — K219 Gastro-esophageal reflux disease without esophagitis: Secondary | ICD-10-CM | POA: Insufficient documentation

## 2013-07-15 DIAGNOSIS — H547 Unspecified visual loss: Secondary | ICD-10-CM | POA: Diagnosis not present

## 2013-07-15 DIAGNOSIS — I10 Essential (primary) hypertension: Secondary | ICD-10-CM | POA: Insufficient documentation

## 2013-07-15 DIAGNOSIS — Z87891 Personal history of nicotine dependence: Secondary | ICD-10-CM | POA: Diagnosis not present

## 2013-07-15 DIAGNOSIS — Z8739 Personal history of other diseases of the musculoskeletal system and connective tissue: Secondary | ICD-10-CM | POA: Insufficient documentation

## 2013-07-15 DIAGNOSIS — Z79899 Other long term (current) drug therapy: Secondary | ICD-10-CM | POA: Insufficient documentation

## 2013-07-15 DIAGNOSIS — E785 Hyperlipidemia, unspecified: Secondary | ICD-10-CM | POA: Insufficient documentation

## 2013-07-15 DIAGNOSIS — H9190 Unspecified hearing loss, unspecified ear: Secondary | ICD-10-CM | POA: Diagnosis not present

## 2013-07-15 DIAGNOSIS — Z7982 Long term (current) use of aspirin: Secondary | ICD-10-CM | POA: Insufficient documentation

## 2013-07-15 DIAGNOSIS — Z792 Long term (current) use of antibiotics: Secondary | ICD-10-CM | POA: Insufficient documentation

## 2013-07-15 DIAGNOSIS — Z8669 Personal history of other diseases of the nervous system and sense organs: Secondary | ICD-10-CM | POA: Insufficient documentation

## 2013-07-15 LAB — BASIC METABOLIC PANEL
BUN: 17 mg/dL (ref 6–23)
CO2: 28 mEq/L (ref 19–32)
Calcium: 9.6 mg/dL (ref 8.4–10.5)
Chloride: 100 mEq/L (ref 96–112)
Creatinine, Ser: 0.82 mg/dL (ref 0.50–1.10)
GFR calc Af Amer: 81 mL/min — ABNORMAL LOW (ref >90)
GFR calc non Af Amer: 70 mL/min — ABNORMAL LOW (ref >90)
Glucose, Bld: 114 mg/dL — ABNORMAL HIGH (ref 70–99)
Potassium: 3.9 mEq/L (ref 3.5–5.1)
Sodium: 137 mEq/L (ref 135–145)

## 2013-07-15 LAB — CBC WITH DIFFERENTIAL/PLATELET
Basophils Absolute: 0 10*3/uL (ref 0.0–0.1)
Basophils Relative: 1 % (ref 0–1)
Eosinophils Absolute: 0.1 10*3/uL (ref 0.0–0.7)
Eosinophils Relative: 1 % (ref 0–5)
HCT: 37.4 % (ref 36.0–46.0)
Hemoglobin: 13 g/dL (ref 12.0–15.0)
Lymphocytes Relative: 29 % (ref 12–46)
Lymphs Abs: 2.2 10*3/uL (ref 0.7–4.0)
MCH: 30.2 pg (ref 26.0–34.0)
MCHC: 34.8 g/dL (ref 30.0–36.0)
MCV: 87 fL (ref 78.0–100.0)
Monocytes Absolute: 0.5 10*3/uL (ref 0.1–1.0)
Monocytes Relative: 7 % (ref 3–12)
Neutro Abs: 4.6 10*3/uL (ref 1.7–7.7)
Neutrophils Relative %: 62 % (ref 43–77)
Platelets: 222 10*3/uL (ref 150–400)
RBC: 4.3 MIL/uL (ref 3.87–5.11)
RDW: 12.5 % (ref 11.5–15.5)
WBC: 7.4 10*3/uL (ref 4.0–10.5)

## 2013-07-15 LAB — PROTIME-INR
INR: 1 (ref 0.00–1.49)
Prothrombin Time: 13 seconds (ref 11.6–15.2)

## 2013-07-15 NOTE — ED Notes (Signed)
Pt. reports recurring epistaxis at left nare this evening , seen here this morning for the same complaint packed left nare prior to discharge.

## 2013-07-15 NOTE — ED Notes (Signed)
Nasal packing remains in left nares-- denies blood drainage in throat.

## 2013-07-15 NOTE — ED Provider Notes (Signed)
CSN: [id number]     Arrival date & time 07/15/13  0800 History   First MD Initiated Contact with Patient 07/15/13 [phone]     Chief Complaint  Patient presents with  . Epistaxis   (Consider location/radiation/quality/duration/timing/severity/associated sxs/prior Treatment) The history is provided by the patient and the spouse. No language interpreter was used.  Katrina Young is a 72 year old female with past medical history of hypertension, hyperlipidemia, reflux, varicose veins presenting to emergency department with epistaxis that started approximately 7:30 AM this morning. Patient reports that shortly after her shower, she noticed that she had a blood running from her nostrils. Reported that he started on a left, is now gone to her right nostril as well. Patient reports she's been applying pressure with negative relief with negative stopping the blood. Patient reports she's had history of nosebleeds in the past-reported that she has been having to get the nose packed sig order to stop the bleeding. Denied dizziness, headache, blurred vision, chest pain, shortness of breath, difficulty breathing. PCP Dr. Whitney Post  Past Medical History  Diagnosis Date  . Weight gain   . Reflux   . Constipation   . 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 Joint pain   . Hiatal hernia   . Hypertension   . Hyperlipidemia   . Varicose veins   . Leg pain    Past Surgical History  Procedure Laterality Date  . Evla   RIGHT SMALL SAPHENOUS VEIN 09-13-2007  . Evla  R  GSV  02-10-2011  . Shoulder surgery    . Bladder surgery    . Endovenous ablation saphenous vein w/ laser  12-30-2011    left greater saphenous vein   Family History  Problem Relation Age of Onset  . Heart disease Mother   . Hypertension Father    History  Substance Use Topics  . Smoking status: Former Smoker -- 2 years    Types: Cigarettes    Quit date: 07/26/1966  . Smokeless tobacco: Never  Used  . Alcohol Use: No   OB History   Grav Para Term Preterm Abortions TAB SAB Ect Mult Living                 Review of Systems  Constitutional: Negative for fever and chills.  HENT: Positive for nosebleeds. Negative for trouble swallowing.   Eyes: Negative for visual disturbance.  Respiratory: Negative for chest tightness and shortness of breath.   Cardiovascular: Negative for chest pain.  Neurological: Negative for dizziness and weakness.  All other systems reviewed and are negative.    Allergies  Codeine and Morphine and related  Home Medications   Current Outpatient Rx  Name  Route  Sig  Dispense  Refill  . aspirin 81 MG tablet   Oral   Take 81 mg by mouth daily. Takes 2 tablets daily.         Marland Kitchen atenolol (TENORMIN) 50 MG tablet   Oral   Take 50 mg by mouth daily.         Marland Kitchen esomeprazole (NEXIUM) 40 MG capsule   Oral   Take 40 mg by mouth daily before breakfast.          . LORazepam (ATIVAN) 1 MG tablet   Oral   Take 1 mg by mouth 2 (two) times daily as needed for anxiety.          Marland Kitchen losartan (COZAAR) 100 MG tablet   Oral  Take 100 mg by mouth daily.         . pravastatin (PRAVACHOL) 20 MG tablet   Oral   Take 20 mg by mouth daily.           BP 178/75  Pulse 73  Resp 16  SpO2 99% Physical Exam  Nursing note and vitals reviewed. Constitutional: She is oriented to person, place, and time. She appears well-developed and well-nourished. No distress.  Patient calm sitting upright in bed  HENT:  Head: Normocephalic and atraumatic.  Nose: Epistaxis is observed.  Bilateral nostrils bleeding bright red blood, decent flow   Eyes: Conjunctivae are normal. Pupils are equal, round, and reactive to light. Right eye exhibits no discharge. Left eye exhibits no discharge.  Neck: Normal range of motion. Neck supple.  Cardiovascular: Normal rate, regular rhythm and normal heart sounds.  Exam reveals no friction rub.   No murmur heard. Pulses:       Radial pulses are 2+ on the right side, and 2+ on the left side.  Pulmonary/Chest: Effort normal and breath sounds normal. No respiratory distress. She has no wheezes. She has no rales.  Musculoskeletal: Normal range of motion.  Lymphadenopathy:    She has no cervical adenopathy.  Neurological: She is alert and oriented to person, place, and time. She exhibits normal muscle tone. Coordination normal.  Skin: Skin is warm and dry. No rash noted. She is not diaphoretic. No erythema.  Psychiatric: She has a normal mood and affect. Her behavior is normal. Thought content normal.    ED Course  Procedures (including critical care time)  8:47 AM Discussed case with Dr. Genene Churn. Dr. Genene Churn assessed patient and placed packing.  Patient doing well. Patient cleared for discharge by Dr. Genene Churn.   Labs Review Labs Reviewed - No data to display Imaging Review No results found.  EKG Interpretation   None       MDM   1. Epistaxis    Filed Vitals:   07/15/13 0807  BP: 178/75  Pulse: 73  Resp: 16  SpO2: 99%   Patient presenting to emergency department with epistaxis started this morning at approximately 7:30 AM. Reported that started at to bilateral nostrils. Patient reported that she had history of this before where packing needs to be placed. Alert and oriented. GCS 15. Heart rate and rhythm normal. Lungs clear to patient bilaterally. Radial pulses 2+. Negative facial trauma identified. Bilateral nostrils identified to have bleeding, bright red blood, decent flow.  Afrin spray to the nostrils were applied and waited 10 minutes-continue to have bleeding. Discussed case with Dr. Fayrene Fearing, Dr. Fayrene Fearing applied packing to control the bleeding. Patient reported that she has an ENT. Attending physician cleared patient for discharge. Patient stable, afebrile. Negative dizziness, weakness. Patient discharged with referral to primary care provider and ENT. Discussed with patient to rest and stay  hydrated. Discussed with patient to closely monitor high blood pressure and to take medications. Discussed with patient to closely monitor symptoms and if symptoms are to worsen or change to report back to the ED - strict return instructions given.  Patient agreed to plan of care, understood, all questions answered.      Raymon Mutton, PA-C 07/16/13 1858

## 2013-07-15 NOTE — ED Provider Notes (Signed)
Patient seen and evaluated. Nose bleeds since early this morning after getting out of the shower. Has had previous nosebleeds before. Is a several years ago. She takes aspirin.  His exam shows active expose bleeder on the left anterior septum. Cotton distilled with Neo-Synephrine placed in the left naris for 20 minutes. On reexamination there is continued flow from the bleeder. 2 side-by-side Merocelsponges placed in the left naris. After 20 minutes there is good residual hemostasis. Plan will be holding her aspirin. Follow up with her ENT physician.  Roney Marion, MD 07/15/13 1011

## 2013-07-15 NOTE — ED Notes (Signed)
Pt presents to department for evaluation of nosebleed. Onset this morning @ 07:30 while at home. Reports initial bleeding from L nostril, upon arrival bleeding noted from both nostrils. Noted to be hypertensive. Takes aspirin at home. Pt is alert and oriented x4. Ice pack and direct pressure applied upon arrival to ED. Respirations unlabored.

## 2013-07-15 NOTE — ED Notes (Signed)
EDP at bedside to place packing in L nare. Pt tolerating without difficulty.

## 2013-07-16 ENCOUNTER — Emergency Department (HOSPITAL_COMMUNITY)
Admission: EM | Admit: 2013-07-16 | Discharge: 2013-07-16 | Disposition: A | Discharge status: Home / Self Care | Payer: Medicare Other | Attending: Emergency Medicine | Admitting: Emergency Medicine

## 2013-07-16 DIAGNOSIS — R040 Epistaxis: Secondary | ICD-10-CM

## 2013-07-16 MED ORDER — AMOXICILLIN-POT CLAVULANATE 875-125 MG PO TABS
1.0000 | ORAL_TABLET | Freq: Once | ORAL | Status: AC
  Administered 2013-07-16: 1 via ORAL
  Filled 2013-07-16: qty 1

## 2013-07-16 MED ORDER — AMOXICILLIN-POT CLAVULANATE 875-125 MG PO TABS: 1.0000 | ORAL_TABLET | Freq: Two times a day (BID) | ORAL | Status: DC

## 2013-07-16 NOTE — ED Notes (Signed)
MD at bedside. Replacing rhino-rocket.

## 2013-07-16 NOTE — ED Provider Notes (Signed)
Medical screening examination/treatment/procedure(s) were conducted as a shared visit with non-physician practitioner(s) and myself.  I personally evaluated the patient during the encounter.  EKG Interpretation   None       Please see my above note.  Packing placed with excellent hemostasis.  Re check after 20 minutes shows same.  Pt dispositioned home to f/u with her ENT.  Rolland Porter, MD 07/16/13 2250

## 2013-07-16 NOTE — ED Provider Notes (Signed)
CSN: [id number]     Arrival date & time 07/15/13  2148 History   First MD Initiated Contact with Patient 07/16/13 0111     Chief Complaint  Patient presents with  . Epistaxis   (Consider location/radiation/quality/duration/timing/severity/associated sxs/prior Treatment) HPI 72 yo female returns to the ER from home with recurrent nosebleed.  Pt was seen earlier today with left nostril bleeding.  She had merocel packing placed, with resolution at that time.  Bleeding started again this evening.  She is complaining of clot going down back of her throat gagging her.  Pt noted to be htn, reports she has taken her evening meds.  Pt is on aspirin only.  She has h/o prior nosebleed in remote past, sees local ENT.  Past Medical History  Diagnosis Date  . Weight gain   . Reflux   . Constipation   . 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 Joint pain   . Hiatal hernia   . Hypertension   . Hyperlipidemia   . Varicose veins   . Leg pain    Past Surgical History  Procedure Laterality Date  . Evla   RIGHT SMALL SAPHENOUS VEIN 09-13-2007  . Evla  R  GSV  02-10-2011  . Shoulder surgery    . Bladder surgery    . Endovenous ablation saphenous vein w/ laser  12-30-2011    left greater saphenous vein   Family History  Problem Relation Age of Onset  . Heart disease Mother   . Hypertension Father    History  Substance Use Topics  . Smoking status: Former Smoker -- 2 years    Types: Cigarettes    Quit date: 07/26/1966  . Smokeless tobacco: Never Used  . Alcohol Use: No   OB History   Grav Para Term Preterm Abortions TAB SAB Ect Mult Living                 Review of Systems  All other systems reviewed and are negative.    Allergies  Codeine and Morphine and related  Home Medications   Current Outpatient Rx  Name  Route  Sig  Dispense  Refill  . aspirin 81 MG tablet   Oral   Take 81 mg by mouth daily. Takes 2 tablets daily.          Marland Kitchen atenolol (TENORMIN) 50 MG tablet   Oral   Take 50 mg by mouth daily.         Marland Kitchen esomeprazole (NEXIUM) 40 MG capsule   Oral   Take 40 mg by mouth daily before breakfast.          . LORazepam (ATIVAN) 1 MG tablet   Oral   Take 1 mg by mouth 2 (two) times daily as needed for anxiety.          Marland Kitchen losartan (COZAAR) 100 MG tablet   Oral   Take 100 mg by mouth daily.         . pravastatin (PRAVACHOL) 20 MG tablet   Oral   Take 20 mg by mouth daily.          Marland Kitchen amoxicillin-clavulanate (AUGMENTIN) 875-125 MG per tablet   Oral   Take 1 tablet by mouth every 12 (twelve) hours.   14 tablet   0    BP 133/52  Pulse 53  Temp(Src) 97.8 F (36.6 C) (Oral)  Resp 20  SpO2 96% Physical Exam  Nursing note and vitals  reviewed. Constitutional: She is oriented to person, place, and time. She appears well-developed and well-nourished. She appears distressed (uncomfortable appearing).  HENT:  Head: Normocephalic and atraumatic.  Right Ear: External ear normal.  Left Ear: External ear normal.  Packing noted to left nostril with occasional dripping of blood from that side.  Streaks of blood noted in posterior pharynx. Packing removed, unable to visualize source of bleeding due to brisk flow.  Pt able to blow out large amount of clot, coughed up another large clot  Eyes: Conjunctivae and EOM are normal. Pupils are equal, round, and reactive to light.  Neck: Normal range of motion. Neck supple. No JVD present. No tracheal deviation present. No thyromegaly present.  Cardiovascular: Normal rate, regular rhythm, normal heart sounds and intact distal pulses.  Exam reveals no gallop and no friction rub.   No murmur heard. Pulmonary/Chest: Effort normal and breath sounds normal. No stridor. No respiratory distress. She has no wheezes. She has no rales. She exhibits no tenderness.  Abdominal: Soft. Bowel sounds are normal. She exhibits no distension and no mass. There is no tenderness. There is  no rebound and no guarding.  Musculoskeletal: Normal range of motion. She exhibits no edema and no tenderness.  Lymphadenopathy:    She has no cervical adenopathy.  Neurological: She is alert and oriented to person, place, and time. She exhibits normal muscle tone. Coordination normal.  Skin: Skin is warm and dry. No rash noted. No erythema. No pallor.  Psychiatric: She has a normal mood and affect. Her behavior is normal. Judgment and thought content normal.    ED Course  EPISTAXIS MANAGEMENT Date/Time: 07/16/2013 8:05 AM Performed by: Olivia Mackie Authorized by: Olivia Mackie Consent: Verbal consent obtained. Risks and benefits: risks, benefits and alternatives were discussed Anesthesia: local infiltration (Aerosolized through a syringe) Local anesthetic: lidocaine 2% with epinephrine Anesthetic total: 5 ml Patient sedated: no Treatment site: left anterior and left posterior Repair method: suction (Rapid Rhino) Post-procedure assessment: bleeding stopped Treatment complexity: simple   (including critical care time) Labs Review Labs Reviewed  BASIC METABOLIC PANEL - Abnormal; Notable for the following:    Glucose, Bld 114 (*)    GFR calc non Af Amer 70 (*)    GFR calc Af Amer 81 (*)    All other components within normal limits  CBC WITH DIFFERENTIAL  PROTIME-INR   Imaging Review No results found.  EKG Interpretation   None       MDM   1. Epistaxis, recurrent    72 year old female with recurrent epistaxis of the left nostril.  Packing removed, clots removed.  Rapid Rhino placed in left nare.  She tolerated procedure well.  No further bleeding.  Blood pressure normalized.  Patient started on antibiotics to cover for toxic shock from tampon placement.  Followup with her ENT doctor    Olivia Mackie, MD 07/16/13 2141

## 2013-07-20 ENCOUNTER — Encounter (HOSPITAL_COMMUNITY): Payer: Self-pay | Admitting: Emergency Medicine

## 2013-07-20 ENCOUNTER — Emergency Department (HOSPITAL_COMMUNITY)
Admission: EM | Admit: 2013-07-20 | Discharge: 2013-07-20 | Disposition: A | Discharge status: Home / Self Care | Payer: Medicare Other | Attending: Emergency Medicine | Admitting: Emergency Medicine

## 2013-07-20 DIAGNOSIS — Z87891 Personal history of nicotine dependence: Secondary | ICD-10-CM | POA: Diagnosis not present

## 2013-07-20 DIAGNOSIS — Z86718 Personal history of other venous thrombosis and embolism: Secondary | ICD-10-CM | POA: Insufficient documentation

## 2013-07-20 DIAGNOSIS — I10 Essential (primary) hypertension: Secondary | ICD-10-CM | POA: Diagnosis not present

## 2013-07-20 DIAGNOSIS — E785 Hyperlipidemia, unspecified: Secondary | ICD-10-CM | POA: Diagnosis not present

## 2013-07-20 DIAGNOSIS — Z8719 Personal history of other diseases of the digestive system: Secondary | ICD-10-CM | POA: Diagnosis not present

## 2013-07-20 DIAGNOSIS — Z8669 Personal history of other diseases of the nervous system and sense organs: Secondary | ICD-10-CM | POA: Diagnosis not present

## 2013-07-20 DIAGNOSIS — R040 Epistaxis: Secondary | ICD-10-CM

## 2013-07-20 DIAGNOSIS — Z792 Long term (current) use of antibiotics: Secondary | ICD-10-CM | POA: Diagnosis not present

## 2013-07-20 DIAGNOSIS — Z79899 Other long term (current) drug therapy: Secondary | ICD-10-CM | POA: Diagnosis not present

## 2013-07-20 MED ORDER — SILVER NITRATE-POT NITRATE 75-25 % EX MISC: 1.0000 | Freq: Once | CUTANEOUS | Status: DC

## 2013-07-20 NOTE — ED Notes (Signed)
Pt sent here by her doctor to have nose cauterized for nose bleed. Pt has packing in nose.

## 2013-07-20 NOTE — ED Provider Notes (Signed)
TIME SEEN: 9:35 AM  CHIEF COMPLAINT: "I'm here to have my packing removed in my nose cauterized"  HPI: Pt is a 72 y.o. female with a history of hypertension, hyperlipidemia who presents the emergency department by recommendation of her ENT physician's office to have her nasal packing removed. Patient was here on 12/21 and 12/22 for recurrent epistaxis. She had nasal packing in place but continued to have bleeding and therefore packing was removed and replaced on 12/22. She states she was supposed to followup with Dr. Haroldine Laws with ENT but was unable to get to his office as he canceled her appointment on 12/24 and no one could see her today in the office. She states she is here to have her packing removed and to see if she can have her nose cauterized. She denies any further bleeding. She is not on anticoagulation. Denies any trauma to the nose. No foreign bodies in her nose. No fevers. No lightheadedness.  ROS: See HPI Constitutional: no fever  Eyes: no drainage  ENT: no runny nose   Cardiovascular:  no chest pain  Resp: no SOB  GI: no vomiting GU: no dysuria Integumentary: no rash  Allergy: no hives  Musculoskeletal: no leg swelling  Neurological: no slurred speech ROS otherwise negative  PAST MEDICAL HISTORY/PAST SURGICAL HISTORY:  Past Medical History  Diagnosis Date  . Weight gain   . Reflux   . Constipation   . 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 Joint pain   . Hiatal hernia   . Hypertension   . Hyperlipidemia   . Varicose veins   . Leg pain     MEDICATIONS:  Prior to Admission medications   Medication Sig Start Date End Date Taking? Authorizing Provider  amoxicillin-clavulanate (AUGMENTIN) 875-125 MG per tablet Take 1 tablet by mouth every 12 (twelve) hours. 07/16/13   Olivia Mackie, MD  aspirin 81 MG tablet Take 81 mg by mouth daily. Takes 2 tablets daily.    Historical Provider, MD  atenolol (TENORMIN) 50 MG tablet Take 50  mg by mouth daily.    Historical Provider, MD  esomeprazole (NEXIUM) 40 MG capsule Take 40 mg by mouth daily before breakfast.     Historical Provider, MD  LORazepam (ATIVAN) 1 MG tablet Take 1 mg by mouth 2 (two) times daily as needed for anxiety.     Historical Provider, MD  losartan (COZAAR) 100 MG tablet Take 100 mg by mouth daily. 08/31/12   Historical Provider, MD  pravastatin (PRAVACHOL) 20 MG tablet Take 20 mg by mouth daily.     Historical Provider, MD    ALLERGIES:  Allergies  Allergen Reactions  . Codeine Nausea Only  . Morphine And Related Nausea Only    SOCIAL HISTORY:  History  Substance Use Topics  . Smoking status: Former Smoker -- 2 years    Types: Cigarettes    Quit date: 07/26/1966  . Smokeless tobacco: Never Used  . Alcohol Use: No    FAMILY HISTORY: Family History  Problem Relation Age of Onset  . Heart disease Mother   . Hypertension Father     EXAM: BP 152/58  Pulse 62  Temp(Src) 97.9 F (36.6 C) (Oral)  Resp 17  Wt 165 lb (74.844 kg)  SpO2 100% CONSTITUTIONAL: Alert and oriented and responds appropriately to questions. Well-appearing; well-nourished HEAD: Normocephalic EYES: Conjunctivae clear, PERRL ENT: normal nose; no rhinorrhea; moist mucous membranes; pharynx without lesions noted, packing to the left  nostril, no blood in the posterior oropharynx, when packing was removed there was a small vessel anteriorly and another small vessel in the lateral aspect of the left nostril was successfully cauterized NECK: Supple, no meningismus, no LAD  CARD: RRR; S1 and S2 appreciated; no murmurs, no clicks, no rubs, no gallops RESP: Normal chest excursion without splinting or tachypnea; breath sounds clear and equal bilaterally; no wheezes, no rhonchi, no rales,  ABD/GI: Normal bowel sounds; non-distended; soft, non-tender, no rebound, no guarding BACK:  The back appears normal and is non-tender to palpation, there is no CVA tenderness EXT: Normal ROM in  all joints; non-tender to palpation; no edema; normal capillary refill; no cyanosis    SKIN: Normal color for age and race; warm NEURO: Moves all extremities equally PSYCH: The patient's mood and manner are appropriate. Grooming and personal hygiene are appropriate.  MEDICAL DECISION MAKING: Patient here to have packing removed and her nose cauterized for recurrent epistaxis. She states she's had this problem for several years but her last nosebleed was 5 years ago. She is hemodynamically stable with no signs of infection and no recurrent bleeding. There were 2 small vessels in her left nostril that were cauterized in the emergency department and her packing was removed. We'll have her continue her antibiotics to completion and have her followup with ENT for recheck. Given strict return precautions. Given supportive care instructions. Patient verbalizes understanding and is comfortable with this plan.       Katrina Maw Riyan Haile, DO 07/20/13 1028

## 2013-07-27 DIAGNOSIS — N3946 Mixed incontinence: Secondary | ICD-10-CM | POA: Diagnosis not present

## 2013-07-30 DIAGNOSIS — N3946 Mixed incontinence: Secondary | ICD-10-CM | POA: Diagnosis not present

## 2013-07-30 DIAGNOSIS — R040 Epistaxis: Secondary | ICD-10-CM | POA: Diagnosis not present

## 2013-08-23 DIAGNOSIS — N3946 Mixed incontinence: Secondary | ICD-10-CM | POA: Diagnosis not present

## 2013-08-23 DIAGNOSIS — R279 Unspecified lack of coordination: Secondary | ICD-10-CM | POA: Diagnosis not present

## 2013-08-23 DIAGNOSIS — M6281 Muscle weakness (generalized): Secondary | ICD-10-CM | POA: Diagnosis not present

## 2013-09-04 DIAGNOSIS — M6281 Muscle weakness (generalized): Secondary | ICD-10-CM | POA: Diagnosis not present

## 2013-09-04 DIAGNOSIS — R279 Unspecified lack of coordination: Secondary | ICD-10-CM | POA: Diagnosis not present

## 2013-09-04 DIAGNOSIS — N3946 Mixed incontinence: Secondary | ICD-10-CM | POA: Diagnosis not present

## 2013-09-21 DIAGNOSIS — H2510 Age-related nuclear cataract, unspecified eye: Secondary | ICD-10-CM | POA: Diagnosis not present

## 2013-09-21 DIAGNOSIS — H04129 Dry eye syndrome of unspecified lacrimal gland: Secondary | ICD-10-CM | POA: Diagnosis not present

## 2013-10-09 DIAGNOSIS — R279 Unspecified lack of coordination: Secondary | ICD-10-CM | POA: Diagnosis not present

## 2013-10-09 DIAGNOSIS — M6281 Muscle weakness (generalized): Secondary | ICD-10-CM | POA: Diagnosis not present

## 2013-10-09 DIAGNOSIS — N3946 Mixed incontinence: Secondary | ICD-10-CM | POA: Diagnosis not present

## 2013-10-18 DIAGNOSIS — R300 Dysuria: Secondary | ICD-10-CM | POA: Diagnosis not present

## 2013-10-18 DIAGNOSIS — R82998 Other abnormal findings in urine: Secondary | ICD-10-CM | POA: Diagnosis not present

## 2013-10-18 DIAGNOSIS — R809 Proteinuria, unspecified: Secondary | ICD-10-CM | POA: Diagnosis not present

## 2013-10-23 DIAGNOSIS — M6281 Muscle weakness (generalized): Secondary | ICD-10-CM | POA: Diagnosis not present

## 2013-10-23 DIAGNOSIS — R279 Unspecified lack of coordination: Secondary | ICD-10-CM | POA: Diagnosis not present

## 2013-10-23 DIAGNOSIS — N3946 Mixed incontinence: Secondary | ICD-10-CM | POA: Diagnosis not present

## 2013-10-29 DIAGNOSIS — N3946 Mixed incontinence: Secondary | ICD-10-CM | POA: Diagnosis not present

## 2013-10-29 DIAGNOSIS — N3020 Other chronic cystitis without hematuria: Secondary | ICD-10-CM | POA: Diagnosis not present

## 2013-11-06 DIAGNOSIS — M6281 Muscle weakness (generalized): Secondary | ICD-10-CM | POA: Diagnosis not present

## 2013-11-06 DIAGNOSIS — R279 Unspecified lack of coordination: Secondary | ICD-10-CM | POA: Diagnosis not present

## 2013-11-06 DIAGNOSIS — N3946 Mixed incontinence: Secondary | ICD-10-CM | POA: Diagnosis not present

## 2013-11-26 ENCOUNTER — Other Ambulatory Visit: Payer: Self-pay | Admitting: Dermatology

## 2013-11-26 DIAGNOSIS — C44621 Squamous cell carcinoma of skin of unspecified upper limb, including shoulder: Secondary | ICD-10-CM | POA: Diagnosis not present

## 2013-11-26 DIAGNOSIS — L570 Actinic keratosis: Secondary | ICD-10-CM | POA: Diagnosis not present

## 2013-12-28 DIAGNOSIS — I10 Essential (primary) hypertension: Secondary | ICD-10-CM | POA: Diagnosis not present

## 2013-12-28 DIAGNOSIS — E785 Hyperlipidemia, unspecified: Secondary | ICD-10-CM | POA: Diagnosis not present

## 2013-12-28 DIAGNOSIS — R82998 Other abnormal findings in urine: Secondary | ICD-10-CM | POA: Diagnosis not present

## 2014-01-03 DIAGNOSIS — R3129 Other microscopic hematuria: Secondary | ICD-10-CM | POA: Diagnosis not present

## 2014-01-03 DIAGNOSIS — R809 Proteinuria, unspecified: Secondary | ICD-10-CM | POA: Diagnosis not present

## 2014-01-04 DIAGNOSIS — R609 Edema, unspecified: Secondary | ICD-10-CM | POA: Diagnosis not present

## 2014-01-04 DIAGNOSIS — M949 Disorder of cartilage, unspecified: Secondary | ICD-10-CM | POA: Diagnosis not present

## 2014-01-04 DIAGNOSIS — I10 Essential (primary) hypertension: Secondary | ICD-10-CM | POA: Diagnosis not present

## 2014-01-04 DIAGNOSIS — R3129 Other microscopic hematuria: Secondary | ICD-10-CM | POA: Diagnosis not present

## 2014-01-04 DIAGNOSIS — Z0000 Encounter for general adult medical examination without abnormal findings: Secondary | ICD-10-CM | POA: Diagnosis not present

## 2014-01-04 DIAGNOSIS — Z1212 Encounter for screening for malignant neoplasm of rectum: Secondary | ICD-10-CM | POA: Diagnosis not present

## 2014-01-04 DIAGNOSIS — E785 Hyperlipidemia, unspecified: Secondary | ICD-10-CM | POA: Diagnosis not present

## 2014-01-04 DIAGNOSIS — Z6829 Body mass index (BMI) 29.0-29.9, adult: Secondary | ICD-10-CM | POA: Diagnosis not present

## 2014-01-04 DIAGNOSIS — Z23 Encounter for immunization: Secondary | ICD-10-CM | POA: Diagnosis not present

## 2014-01-04 DIAGNOSIS — M899 Disorder of bone, unspecified: Secondary | ICD-10-CM | POA: Diagnosis not present

## 2014-01-15 DIAGNOSIS — L570 Actinic keratosis: Secondary | ICD-10-CM | POA: Diagnosis not present

## 2014-01-15 DIAGNOSIS — Z85828 Personal history of other malignant neoplasm of skin: Secondary | ICD-10-CM | POA: Diagnosis not present

## 2014-01-17 DIAGNOSIS — R300 Dysuria: Secondary | ICD-10-CM | POA: Diagnosis not present

## 2014-04-16 DIAGNOSIS — Z85828 Personal history of other malignant neoplasm of skin: Secondary | ICD-10-CM | POA: Diagnosis not present

## 2014-04-16 DIAGNOSIS — L570 Actinic keratosis: Secondary | ICD-10-CM | POA: Diagnosis not present

## 2014-04-16 DIAGNOSIS — L820 Inflamed seborrheic keratosis: Secondary | ICD-10-CM | POA: Diagnosis not present

## 2014-04-24 DIAGNOSIS — Z23 Encounter for immunization: Secondary | ICD-10-CM | POA: Diagnosis not present

## 2014-05-16 ENCOUNTER — Other Ambulatory Visit: Payer: Self-pay | Admitting: Obstetrics and Gynecology

## 2014-05-16 DIAGNOSIS — R1032 Left lower quadrant pain: Secondary | ICD-10-CM | POA: Diagnosis not present

## 2014-05-16 DIAGNOSIS — K439 Ventral hernia without obstruction or gangrene: Secondary | ICD-10-CM | POA: Diagnosis not present

## 2014-05-16 DIAGNOSIS — Z124 Encounter for screening for malignant neoplasm of cervix: Secondary | ICD-10-CM | POA: Diagnosis not present

## 2014-05-16 DIAGNOSIS — Z1231 Encounter for screening mammogram for malignant neoplasm of breast: Secondary | ICD-10-CM | POA: Diagnosis not present

## 2014-05-16 DIAGNOSIS — N393 Stress incontinence (female) (male): Secondary | ICD-10-CM | POA: Diagnosis not present

## 2014-05-16 DIAGNOSIS — Z1239 Encounter for other screening for malignant neoplasm of breast: Secondary | ICD-10-CM | POA: Diagnosis not present

## 2014-05-16 DIAGNOSIS — N952 Postmenopausal atrophic vaginitis: Secondary | ICD-10-CM | POA: Diagnosis not present

## 2014-05-17 LAB — CYTOLOGY - PAP

## 2014-06-10 ENCOUNTER — Ambulatory Visit (INDEPENDENT_AMBULATORY_CARE_PROVIDER_SITE_OTHER): Payer: Self-pay | Admitting: Surgery

## 2014-06-10 DIAGNOSIS — K432 Incisional hernia without obstruction or gangrene: Secondary | ICD-10-CM | POA: Diagnosis not present

## 2014-06-12 DIAGNOSIS — Z86010 Personal history of colonic polyps: Secondary | ICD-10-CM | POA: Diagnosis not present

## 2014-06-12 DIAGNOSIS — Z09 Encounter for follow-up examination after completed treatment for conditions other than malignant neoplasm: Secondary | ICD-10-CM | POA: Diagnosis not present

## 2014-07-15 ENCOUNTER — Encounter (HOSPITAL_COMMUNITY)
Admission: RE | Admit: 2014-07-15 | Discharge: 2014-07-15 | Disposition: A | Discharge status: Home / Self Care | Payer: Medicare Other | Source: Ambulatory Visit | Attending: Surgery | Admitting: Surgery

## 2014-07-15 ENCOUNTER — Ambulatory Visit (HOSPITAL_COMMUNITY)
Admission: RE | Admit: 2014-07-15 | Discharge: 2014-07-15 | Disposition: A | Discharge status: Home / Self Care | Payer: Medicare Other | Source: Ambulatory Visit | Attending: Anesthesiology | Admitting: Anesthesiology

## 2014-07-15 ENCOUNTER — Encounter (HOSPITAL_COMMUNITY): Payer: Self-pay

## 2014-07-15 DIAGNOSIS — Z01818 Encounter for other preprocedural examination: Secondary | ICD-10-CM | POA: Diagnosis not present

## 2014-07-15 DIAGNOSIS — K439 Ventral hernia without obstruction or gangrene: Secondary | ICD-10-CM | POA: Diagnosis not present

## 2014-07-15 DIAGNOSIS — I10 Essential (primary) hypertension: Secondary | ICD-10-CM | POA: Insufficient documentation

## 2014-07-15 HISTORY — DX: Unspecified osteoarthritis, unspecified site: M19.90

## 2014-07-15 HISTORY — DX: Reserved for inherently not codable concepts without codable children: IMO0001

## 2014-07-15 HISTORY — DX: Adverse effect of unspecified anesthetic, initial encounter: T41.45XA

## 2014-07-15 HISTORY — DX: Other specified postprocedural states: Z98.890

## 2014-07-15 HISTORY — DX: Gastro-esophageal reflux disease without esophagitis: K21.9

## 2014-07-15 HISTORY — DX: Other complications of anesthesia, initial encounter: T88.59XA

## 2014-07-15 HISTORY — DX: Malignant (primary) neoplasm, unspecified: C80.1

## 2014-07-15 HISTORY — DX: Other specified postprocedural states: R11.2

## 2014-07-15 LAB — CBC
HCT: 39.8 % (ref 36.0–46.0)
Hemoglobin: 13.2 g/dL (ref 12.0–15.0)
MCH: 29.1 pg (ref 26.0–34.0)
MCHC: 33.2 g/dL (ref 30.0–36.0)
MCV: 87.7 fL (ref 78.0–100.0)
Platelets: 204 10*3/uL (ref 150–400)
RBC: 4.54 MIL/uL (ref 3.87–5.11)
RDW: 12.4 % (ref 11.5–15.5)
WBC: 5.9 10*3/uL (ref 4.0–10.5)

## 2014-07-15 LAB — BASIC METABOLIC PANEL
Anion gap: 15 (ref 5–15)
BUN: 14 mg/dL (ref 6–23)
CO2: 26 mEq/L (ref 19–32)
Calcium: 10.3 mg/dL (ref 8.4–10.5)
Chloride: 106 mEq/L (ref 96–112)
Creatinine, Ser: 0.72 mg/dL (ref 0.50–1.10)
GFR calc Af Amer: >90 mL/min (ref >90)
GFR calc non Af Amer: 83 mL/min — ABNORMAL LOW (ref >90)
Glucose, Bld: 102 mg/dL — ABNORMAL HIGH (ref 70–99)
Potassium: 5.1 mEq/L (ref 3.7–5.3)
Sodium: 147 mEq/L (ref 137–147)

## 2014-07-15 NOTE — Progress Notes (Signed)
Quick Note:  EKG is acceptable for scheduled surgery.  Kenya Kook M. Markesia Crilly, MD, FACS [facility] Office: [phone]   ______ 

## 2014-07-15 NOTE — Patient Instructions (Addendum)
Katrina Young  07/15/2014   Your procedure is scheduled on: Tuesday July 23, 2014   Report to [facility]  Entrance and follow signs to               [place] arrive at 9:30 AM.  Call this number if you have problems the morning of surgery [phone]   Remember:  Do not eat food or drink liquids :After Midnight.     Take these medicines the morning of surgery with A SIP OF WATER: Atenolol (Tenormin);Restasis eye drops if needed;Nexium;Lorazepam (Ativan) if needed                               You may not have any metal on your body including hair pins and              piercings  Do not wear jewelry, make-up, lotions, powders or perfumes.             Do not wear nail polish.  Do not shave  48 hours prior to surgery.                 Do not bring valuables to the hospital. [place].  Contacts, dentures or bridgework may not be worn into surgery.  Leave suitcase in the car. After surgery it may be brought to your room.     Patients discharged the day of surgery will not be allowed to drive home.  Name and phone number of your driver:Jerry Guimont (Husband)  _____________________________________________________________________             [facility] - Preparing for Surgery Before surgery, you can play an important role.  Because skin is not sterile, your skin needs to be as free of germs as possible.  You can reduce the number of germs on your skin by washing with CHG (chlorahexidine gluconate) soap before surgery.  CHG is an antiseptic cleaner which kills germs and bonds with the skin to continue killing germs even after washing. Please DO NOT use if you have an allergy to CHG or antibacterial soaps.  If your skin becomes reddened/irritated stop using the CHG and inform your nurse when you arrive at Short Stay. Do not shave (including legs and underarms) for at least 48 hours prior to the first CHG  shower.  You may shave your face/neck. Please follow these instructions carefully:  1.  Shower with CHG Soap the night before surgery and the  morning of Surgery.  2.  If you choose to wash your hair, wash your hair first as usual with your  normal  shampoo.  3.  After you shampoo, rinse your hair and body thoroughly to remove the  shampoo.                           4.  Use CHG as you would any other liquid soap.  You can apply chg directly  to the skin and wash                       Gently with a scrungie or clean washcloth.  5.  Apply the CHG Soap to your body ONLY FROM  THE NECK DOWN.   Do not use on face/ open                           Wound or open sores. Avoid contact with eyes, ears mouth and genitals (private parts).                       Wash face,  Genitals (private parts) with your normal soap.             6.  Wash thoroughly, paying special attention to the area where your surgery  will be performed.  7.  Thoroughly rinse your body with warm water from the neck down.  8.  DO NOT shower/wash with your normal soap after using and rinsing off  the CHG Soap.                9.  Pat yourself dry with a clean towel.            10.  Wear clean pajamas.            11.  Place clean sheets on your bed the night of your first shower and do not  sleep with pets. Day of Surgery : Do not apply any lotions/deodorants the morning of surgery.  Please wear clean clothes to the hospital/surgery center.  FAILURE TO FOLLOW THESE INSTRUCTIONS MAY RESULT IN THE CANCELLATION OF YOUR SURGERY PATIENT SIGNATURE_________________________________  NURSE SIGNATURE__________________________________  ________________________________________________________________________

## 2014-07-15 NOTE — Progress Notes (Signed)
Quick Note:  Pre-operative chest x-ray is acceptable for scheduled surgery.  Laymond Postle M. Shlomie Romig, MD, FACS [facility] Office: [phone]   ______ 

## 2014-07-15 NOTE — Progress Notes (Signed)
Quick Note:  These results are acceptable for scheduled surgery.  Brionna Romanek M. Loren Sawaya, MD, FACS [facility] Office: [phone]   ______ 

## 2014-07-15 NOTE — Progress Notes (Signed)
Quick Note:  These results are acceptable for scheduled surgery.  Doylene Splinter M. Rylee Nuzum, MD, FACS [facility] Office: [phone]   ______ 

## 2014-07-17 NOTE — Progress Notes (Signed)
Consulted Dr. Lissa Hoard about EKG results-per Dr. Lissa Hoard "OK for surgery"

## 2014-07-22 ENCOUNTER — Encounter (HOSPITAL_COMMUNITY): Payer: Self-pay | Admitting: Surgery

## 2014-07-22 DIAGNOSIS — K432 Incisional hernia without obstruction or gangrene: Secondary | ICD-10-CM | POA: Diagnosis present

## 2014-07-22 NOTE — H&P (Signed)
General Surgery [facility]  Katrina Young DOB: 1940/09/25 Married / Language: English / Race: White Female  History of Present Illness Patient words: hernia.  The patient is a 73 year old female who presents with an incisional hernia. The patient is referred by Juanda Chance, NP, for evaluation of ventral incisional hernia. Patient has a distant history of total abdominal hysterectomy and bilateral salpingo-oophorectomy for ovarian carcinoma. She has also undergone 3 cesarean sections and a laparoscopic cholecystectomy. Over the past few years she has noted an enlarging bulge at the upper extent of her incision just to the left of midline. This does not cause discomfort. It is more prominent in a standing position and decreases and a supine position. Patient has had no prior hernia repairs. She does note chronic constipation and takes MiraLAX. Patient is scheduled for colonoscopy later this week. She also has a bladder procedure scheduled for July 02, 2014. Patient presents today to discuss possible ventral incisional hernia repair.   Other Problems  Anxiety Disorder Bladder Problems Cervical Cancer Cholelithiasis Gastroesophageal Reflux Disease Hemorrhoids High blood pressure Hypercholesterolemia Melanoma Oophorectomy  Past Surgical History  Breast Biopsy Right. Cesarean Section - 1 Colon Polyp Removal - Colonoscopy Gallbladder Surgery - Laparoscopic Hysterectomy (due to cancer) - Complete Shoulder Surgery Bilateral. Tonsillectomy  Diagnostic Studies History  Colonoscopy >10 years ago Mammogram within last year Pap Smear 1-5 years ago  Allergies Codeine Phosphate *ANALGESICS - OPIOID* Morphine Sulfate (Concentrate) *ANALGESICS - OPIOID*  Medication History Atenolol (Oral) Specific dose unknown - Active. Nexium 24HR (Oral) Specific dose unknown - Active. LORazepam (Oral) Specific dose unknown -  Active. Pravastatin Sodium (Oral) Specific dose unknown - Active.  Social History  No alcohol use Tobacco use Former smoker.  Family History Alcohol Abuse Brother, Father. Arthritis Brother, Sister. Cerebrovascular Accident Brother. Colon Cancer Sister. Colon Polyps Brother, Father. Heart Disease Brother. Heart disease in female family member before age 40 Melanoma Family Members In General. Prostate Cancer Brother.  Pregnancy / Birth History  Age at menarche 28 years. Age of menopause 52-55 Gravida 3 Irregular periods Maternal age 70-25 Para 3  Review of Systems General Not Present- Appetite Loss, Chills, Fatigue, Fever, Night Sweats, Weight Gain and Weight Loss. Skin Not Present- Change in Wart/Mole, Dryness, Hives, Jaundice, New Lesions, Non-Healing Wounds, Rash and Ulcer. HEENT Present- Nose Bleed and Wears glasses/contact lenses. Not Present- Earache, Hearing Loss, Hoarseness, Oral Ulcers, Ringing in the Ears, Seasonal Allergies, Sinus Pain, Sore Throat, Visual Disturbances and Yellow Eyes. Breast Not Present- Breast Mass, Breast Pain, Nipple Discharge and Skin Changes. Cardiovascular Present- Swelling of Extremities. Not Present- Chest Pain, Difficulty Breathing Lying Down, Leg Cramps, Palpitations, Rapid Heart Rate and Shortness of Breath. Gastrointestinal Present- Constipation, Hemorrhoids and Indigestion. Not Present- Abdominal Pain, Bloating, Bloody Stool, Change in Bowel Habits, Chronic diarrhea, Difficulty Swallowing, Excessive gas, Gets full quickly at meals, Nausea, Rectal Pain and Vomiting. Female Genitourinary Present- Frequency and Urgency. Not Present- Nocturia, Painful Urination and Pelvic Pain. Musculoskeletal Present- Back Pain. Not Present- Joint Pain, Joint Stiffness, Muscle Pain, Muscle Weakness and Swelling of Extremities. Psychiatric Present- Anxiety. Not Present- Bipolar, Change in Sleep Pattern, Depression, Fearful and Frequent  crying. Endocrine Not Present- Cold Intolerance, Excessive Hunger, Hair Changes, Heat Intolerance, Hot flashes and New Diabetes. Hematology Present- Easy Bruising. Not Present- Excessive bleeding, Gland problems, HIV and Persistent Infections.   Vitals  06/10/2014 1:34 PM Weight: 160 lb Height: 63in Body Surface Area: 1.8 m Body Mass Index: 28.34 kg/m Temp.: 97.26F(Temporal)  Pulse: 77 (Regular)  BP: 136/70 (Sitting, Left Arm, Standard)    Physical Exam  General - appears comfortable, no distress; not diaphorectic  HEENT - normocephalic; sclerae clear, gaze conjugate; mucous membranes moist, dentition good; voice normal  Neck - symmetric on extension; no palpable anterior or posterior cervical adenopathy; no palpable masses in the thyroid bed  Chest - clear bilaterally with rhonchi, rales, or wheeze  Cor - regular rhythm with normal rate; no significant murmur  Abd - soft without distension; well-healed surgical incisions; obvious bulge in LUQ just above the upper most extent of the midline incision, partially reducible, non-tender; fascial defect not palpable, but likely very small  Ext - non-tender without significant edema or lymphedema  Neuro - grossly intact; no tremor    Assessment & Plan  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  Patient has a ventral incisional hernia arising from her midline abdominal incision at the superior most extent. The fascial defect is likely relatively small. It is not symptomatic at present but has gradually increased in size over the past few years. I have recommended repair by open technique with mesh. We will schedule this as a operative procedure with an overnight stay. We discussed the risk and benefits of the procedure. We discussed the possibility of recurrence. Patient understands and would like to proceed in the near future.  The risks and benefits  of the procedure have been discussed at length with the patient. The patient understands the proposed procedure, potential alternative treatments, and the course of recovery to be expected. All of the patient's questions have been answered at this time. The patient wishes to proceed with surgery.  Earnstine Regal, MD, [facility] Office: [phone]

## 2014-07-22 NOTE — Progress Notes (Signed)
Patient called and informed of time change for surgery and for her to arrive at Short Stay at 0845 am and nothing to eat or drink after midnight. Patient verbalized understanding.

## 2014-07-23 ENCOUNTER — Encounter (HOSPITAL_COMMUNITY): Payer: Self-pay | Admitting: *Deleted

## 2014-07-23 ENCOUNTER — Ambulatory Visit (HOSPITAL_COMMUNITY): Payer: Medicare Other | Admitting: Anesthesiology

## 2014-07-23 ENCOUNTER — Ambulatory Visit (HOSPITAL_COMMUNITY)
Admission: RE | Admit: 2014-07-23 | Discharge: 2014-07-23 | Disposition: A | Discharge status: Home / Self Care | Payer: Medicare Other | Source: Ambulatory Visit | Attending: Surgery | Admitting: Surgery

## 2014-07-23 ENCOUNTER — Encounter (HOSPITAL_COMMUNITY)
Admission: RE | Disposition: A | Discharge status: Home / Self Care | Payer: Self-pay | Source: Ambulatory Visit | Attending: Surgery

## 2014-07-23 DIAGNOSIS — F419 Anxiety disorder, unspecified: Secondary | ICD-10-CM | POA: Insufficient documentation

## 2014-07-23 DIAGNOSIS — I447 Left bundle-branch block, unspecified: Secondary | ICD-10-CM | POA: Insufficient documentation

## 2014-07-23 DIAGNOSIS — K432 Incisional hernia without obstruction or gangrene: Secondary | ICD-10-CM | POA: Diagnosis not present

## 2014-07-23 DIAGNOSIS — I10 Essential (primary) hypertension: Secondary | ICD-10-CM | POA: Insufficient documentation

## 2014-07-23 DIAGNOSIS — Z87891 Personal history of nicotine dependence: Secondary | ICD-10-CM | POA: Insufficient documentation

## 2014-07-23 DIAGNOSIS — K649 Unspecified hemorrhoids: Secondary | ICD-10-CM | POA: Insufficient documentation

## 2014-07-23 DIAGNOSIS — E780 Pure hypercholesterolemia: Secondary | ICD-10-CM | POA: Diagnosis not present

## 2014-07-23 DIAGNOSIS — Z885 Allergy status to narcotic agent status: Secondary | ICD-10-CM | POA: Diagnosis not present

## 2014-07-23 DIAGNOSIS — K5900 Constipation, unspecified: Secondary | ICD-10-CM | POA: Insufficient documentation

## 2014-07-23 DIAGNOSIS — Z85820 Personal history of malignant melanoma of skin: Secondary | ICD-10-CM | POA: Insufficient documentation

## 2014-07-23 DIAGNOSIS — K219 Gastro-esophageal reflux disease without esophagitis: Secondary | ICD-10-CM | POA: Insufficient documentation

## 2014-07-23 DIAGNOSIS — M1990 Unspecified osteoarthritis, unspecified site: Secondary | ICD-10-CM | POA: Diagnosis not present

## 2014-07-23 DIAGNOSIS — Z8543 Personal history of malignant neoplasm of ovary: Secondary | ICD-10-CM | POA: Diagnosis not present

## 2014-07-23 HISTORY — PX: VENTRAL HERNIA REPAIR: SHX424

## 2014-07-23 SURGERY — REPAIR, HERNIA, VENTRAL
Anesthesia: General

## 2014-07-23 MED ORDER — EPHEDRINE SULFATE 50 MG/ML IJ SOLN
INTRAMUSCULAR | Status: AC
  Filled 2014-07-23: qty 1

## 2014-07-23 MED ORDER — FENTANYL CITRATE 0.05 MG/ML IJ SOLN
INTRAMUSCULAR | Status: AC
  Filled 2014-07-23: qty 5

## 2014-07-23 MED ORDER — BUPIVACAINE HCL (PF) 0.5 % IJ SOLN
INTRAMUSCULAR | Status: AC
  Filled 2014-07-23: qty 30

## 2014-07-23 MED ORDER — NEOSTIGMINE METHYLSULFATE 10 MG/10ML IV SOLN
INTRAVENOUS | Status: DC | PRN
  Administered 2014-07-23: 3 mg via INTRAVENOUS

## 2014-07-23 MED ORDER — LIDOCAINE HCL (CARDIAC) 20 MG/ML IV SOLN
INTRAVENOUS | Status: DC | PRN
  Administered 2014-07-23: 100 mg via INTRAVENOUS

## 2014-07-23 MED ORDER — LACTATED RINGERS IV SOLN
INTRAVENOUS | Status: DC | PRN
  Administered 2014-07-23: 10:00:00 via INTRAVENOUS

## 2014-07-23 MED ORDER — FENTANYL CITRATE 0.05 MG/ML IJ SOLN
INTRAMUSCULAR | Status: DC | PRN
  Administered 2014-07-23: 100 ug via INTRAVENOUS

## 2014-07-23 MED ORDER — GLYCOPYRROLATE 0.2 MG/ML IJ SOLN
INTRAMUSCULAR | Status: AC
  Filled 2014-07-23: qty 2

## 2014-07-23 MED ORDER — LACTATED RINGERS IV SOLN
INTRAVENOUS | Status: DC
  Administered 2014-07-23: 12:00:00 via INTRAVENOUS

## 2014-07-23 MED ORDER — GLYCOPYRROLATE 0.2 MG/ML IJ SOLN
INTRAMUSCULAR | Status: DC | PRN
  Administered 2014-07-23: 0.2 mg via INTRAVENOUS
  Administered 2014-07-23: 0.4 mg via INTRAVENOUS

## 2014-07-23 MED ORDER — CISATRACURIUM BESYLATE 20 MG/10ML IV SOLN
INTRAVENOUS | Status: AC
  Filled 2014-07-23: qty 10

## 2014-07-23 MED ORDER — LIDOCAINE HCL (CARDIAC) 20 MG/ML IV SOLN
INTRAVENOUS | Status: AC
  Filled 2014-07-23: qty 5

## 2014-07-23 MED ORDER — SODIUM CHLORIDE 0.9 % IJ SOLN
INTRAMUSCULAR | Status: AC
  Filled 2014-07-23: qty 10

## 2014-07-23 MED ORDER — DEXAMETHASONE SODIUM PHOSPHATE 10 MG/ML IJ SOLN
INTRAMUSCULAR | Status: AC
  Filled 2014-07-23: qty 1

## 2014-07-23 MED ORDER — ACETAMINOPHEN 10 MG/ML IV SOLN
1000.0000 mg | Freq: Once | INTRAVENOUS | Status: AC
  Administered 2014-07-23: 1000 mg via INTRAVENOUS
  Filled 2014-07-23: qty 100

## 2014-07-23 MED ORDER — OXYCODONE HCL 5 MG PO TABS: 5.0000 mg | ORAL_TABLET | Freq: Four times a day (QID) | ORAL | Status: DC | PRN

## 2014-07-23 MED ORDER — CEFAZOLIN SODIUM-DEXTROSE 2-3 GM-% IV SOLR
INTRAVENOUS | Status: AC
  Filled 2014-07-23: qty 50

## 2014-07-23 MED ORDER — KETOROLAC TROMETHAMINE 15 MG/ML IJ SOLN
INTRAMUSCULAR | Status: DC | PRN
  Administered 2014-07-23: 15 mg via INTRAVENOUS

## 2014-07-23 MED ORDER — PROPOFOL 10 MG/ML IV BOLUS
INTRAVENOUS | Status: DC | PRN
  Administered 2014-07-23: 120 mg via INTRAVENOUS

## 2014-07-23 MED ORDER — SUCCINYLCHOLINE CHLORIDE 20 MG/ML IJ SOLN
INTRAMUSCULAR | Status: DC | PRN
  Administered 2014-07-23: 60 mg via INTRAVENOUS

## 2014-07-23 MED ORDER — ONDANSETRON HCL 4 MG/2ML IJ SOLN
INTRAMUSCULAR | Status: AC
  Filled 2014-07-23: qty 2

## 2014-07-23 MED ORDER — KETOROLAC TROMETHAMINE 30 MG/ML IJ SOLN
INTRAMUSCULAR | Status: AC
  Filled 2014-07-23: qty 1

## 2014-07-23 MED ORDER — BUPIVACAINE HCL (PF) 0.5 % IJ SOLN
INTRAMUSCULAR | Status: DC | PRN
  Administered 2014-07-23: 30 mL

## 2014-07-23 MED ORDER — PROPOFOL 10 MG/ML IV BOLUS
INTRAVENOUS | Status: AC
  Filled 2014-07-23: qty 20

## 2014-07-23 MED ORDER — EPHEDRINE SULFATE 50 MG/ML IJ SOLN
INTRAMUSCULAR | Status: DC | PRN
  Administered 2014-07-23: 5 mg via INTRAVENOUS
  Administered 2014-07-23: 10 mg via INTRAVENOUS
  Administered 2014-07-23: 5 mg via INTRAVENOUS

## 2014-07-23 MED ORDER — CEFAZOLIN SODIUM-DEXTROSE 2-3 GM-% IV SOLR
2.0000 g | INTRAVENOUS | Status: AC
  Administered 2014-07-23: 2 g via INTRAVENOUS

## 2014-07-23 MED ORDER — DEXAMETHASONE SODIUM PHOSPHATE 10 MG/ML IJ SOLN
INTRAMUSCULAR | Status: DC | PRN
  Administered 2014-07-23: 10 mg via INTRAVENOUS

## 2014-07-23 MED ORDER — CISATRACURIUM BESYLATE (PF) 10 MG/5ML IV SOLN
INTRAVENOUS | Status: DC | PRN
  Administered 2014-07-23: 5 mg via INTRAVENOUS

## 2014-07-23 MED ORDER — HYDROMORPHONE HCL 1 MG/ML IJ SOLN: 0.2500 mg | INTRAMUSCULAR | Status: DC | PRN

## 2014-07-23 MED ORDER — ONDANSETRON HCL 4 MG/2ML IJ SOLN
INTRAMUSCULAR | Status: DC | PRN
Start: 2014-07-23 — End: 2014-07-23
  Administered 2014-07-23: 4 mg via INTRAVENOUS

## 2014-07-23 SURGICAL SUPPLY — 27 items
BENZOIN TINCTURE PRP APPL 2/3 (GAUZE/BANDAGES/DRESSINGS) ×2 IMPLANT
BINDER ABDOMINAL 12 ML 46-62 (SOFTGOODS) ×2 IMPLANT
BLADE HEX COATED 2.75 (ELECTRODE) ×2 IMPLANT
CLOSURE STERI-STRIP 1/4X4 (GAUZE/BANDAGES/DRESSINGS) ×2 IMPLANT
DRAPE LAPAROSCOPIC ABDOMINAL (DRAPES) ×2 IMPLANT
ELECT REM PT RETURN 9FT ADLT (ELECTROSURGICAL) ×2
ELECTRODE REM PT RTRN 9FT ADLT (ELECTROSURGICAL) ×1 IMPLANT
GAUZE SPONGE 4X4 12PLY STRL (GAUZE/BANDAGES/DRESSINGS) ×2 IMPLANT
GLOVE BIOGEL PI IND STRL 7.0 (GLOVE) ×1 IMPLANT
GLOVE BIOGEL PI INDICATOR 7.0 (GLOVE) ×1
GLOVE SURG ORTHO 8.0 STRL STRW (GLOVE) ×2 IMPLANT
GOWN STRL REUS W/TWL LRG LVL3 (GOWN DISPOSABLE) ×2 IMPLANT
GOWN STRL REUS W/TWL XL LVL3 (GOWN DISPOSABLE) ×4 IMPLANT
KIT BASIN OR (CUSTOM PROCEDURE TRAY) ×2 IMPLANT
MESH VENTRALEX ST 2.5 CRC MED (Mesh General) ×2 IMPLANT
NS IRRIG 1000ML POUR BTL (IV SOLUTION) ×2 IMPLANT
PACK GENERAL/GYN (CUSTOM PROCEDURE TRAY) ×2 IMPLANT
STAPLER VISISTAT 35W (STAPLE) ×2 IMPLANT
STRIP CLOSURE SKIN 1/2X4 (GAUZE/BANDAGES/DRESSINGS) ×2 IMPLANT
SUT NOVA 1 T20/GS 25DT (SUTURE) IMPLANT
SUT NOVA NAB GS-21 0 18 T12 DT (SUTURE) IMPLANT
SUT PDS AB 1 CTX 36 (SUTURE) IMPLANT
SUT VIC AB 2-0 CT2 27 (SUTURE) IMPLANT
SYR CONTROL 10ML LL (SYRINGE) ×2 IMPLANT
TAPE CLOTH SURG 4X10 WHT LF (GAUZE/BANDAGES/DRESSINGS) ×2 IMPLANT
TOWEL OR 17X26 10 PK STRL BLUE (TOWEL DISPOSABLE) ×2 IMPLANT
TRAY FOLEY CATH 14FRSI W/METER (CATHETERS) IMPLANT

## 2014-07-23 NOTE — Anesthesia Procedure Notes (Signed)
Procedure Name: Intubation Date/Time: 07/23/2014 11:06 AM Performed by: Darlys Gales R Patient Re-evaluated:Patient Re-evaluated prior to inductionOxygen Delivery Method: Circle system utilized Preoxygenation: Pre-oxygenation with 100% oxygen Intubation Type: IV induction Ventilation: Mask ventilation without difficulty and Oral airway inserted - appropriate to patient size Laryngoscope Size: Sabra Heck and 2 Grade View: Grade II Tube type: Oral Tube size: 7.5 mm Number of attempts: 1 Airway Equipment and Method: Stylet Placement Confirmation: ETT inserted through vocal cords under direct vision,  breath sounds checked- equal and bilateral and positive ETCO2 Secured at: 20 cm Tube secured with: Tape Dental Injury: Teeth and Oropharynx as per pre-operative assessment

## 2014-07-23 NOTE — Brief Op Note (Signed)
07/23/2014  Katrina Young  Proc: repair ventral incisional hernia with mesh  Surg: Nicki [place] # [id number]  Earnstine Regal, MD, [facility] Office: [phone]

## 2014-07-23 NOTE — Progress Notes (Signed)
No call back yet from Dr. Harlow Asa.  Pt c/o bladder fullness and discomfort, still unable to void.  [facility], spoke with nursing office. They stated Dr. Harlow Asa is "off after call" and probably will not answer his pager.  They will page the PA or Dr. Excell Seltzer.  Will await a call back.  Coolidge Breeze, RN 07/23/2014

## 2014-07-23 NOTE — Op Note (Signed)
Katrina Young, Katrina Young                  ACCOUNT NO.:  [id number]  MEDICAL RECORD NO.:  [id number]  LOCATION:  [facility]                         FACILITY:  [facility]  PHYSICIAN:  Earnstine Regal, MD      DATE OF BIRTH:  [birth year]  DATE OF PROCEDURE:  07/23/2014                               OPERATIVE REPORT   PREOPERATIVE DIAGNOSIS:  Ventral incisional hernia.  POSTOPERATIVE DIAGNOSIS:  Ventral incisional hernia.  PROCEDURE:  Repair of ventral incisional hernia with Bard Ventralex ST mesh patch (6.4 cm).  SURGEON:  Earnstine Regal, MD, FACS  ANESTHESIA:  General per Dr. Rod Mae.  ESTIMATED BLOOD LOSS:  Minimal.  PREPARATION:  ChloraPrep.  COMPLICATIONS:  None.  INDICATIONS:  The patient is a 73 year old female referred by Juanda Chance, nurse practitioner, for evaluation of ventral incisional hernia. The patient has a distant history of abdominal hysterectomy and bilateral salpingo-oophorectomy for ovarian carcinoma.  She has had 3 cesarean sections and a laparoscopic cholecystectomy.  Over the past several years, she has noted an enlarging bulge in the left upper quadrant of the abdominal wall at the cephalad portion of her incision. She now comes to Surgery for exploration and repair.  BODY OF REPORT:  Procedure was performed in room #2 at the [facility].  The patient was brought to the operating room, placed in supine position on the operating room table.  Following administration of general anesthesia, the patient was positioned and then prepped and draped in the usual aseptic fashion.  After ascertaining that an adequate level of anesthesia had been achieved, a left paramedian incision was made in the upper quadrant of the abdomen along the line of the previous surgical scar.  Dissection was carried into subcutaneous tissues using the electrocautery for hemostasis. Herniated preperitoneal fat was identified.  This was gently dissected out of the subcutaneous  plane down to the fascia.  It was mobilized and the fascial defect was identified.  It was dissected out circumferentially.  The herniated preperitoneal fat was freed from the fascial edges and reduced back within the preperitoneal space.  Fascial defect measured 1.5 cm in greatest diameter.  On digital palpation, the preperitoneal space was opened.  There were no other fascial defects palpable surrounding this region for several centimeters.  A Bard Ventralex ST 6.4-cm mesh patch was selected.  It was prepared and inserted into the preperitoneal space and deployed circumferentially. It was secured to the fascial edges with interrupted #1 Novafil simple sutures.  Local field block was placed with Marcaine.  Subcutaneous tissues were closed with interrupted 3-0 Vicryl sutures.  Skin was anesthetized with Marcaine.  Skin edges were reapproximated with a running 4-0 Monocryl subcuticular suture.  Wound was washed and dried and benzoin and Steri-Strips were applied.  Sterile dressings were applied.  The patient was awakened from anesthesia and brought to the recovery room.  The patient tolerated the procedure well.    Earnstine Regal, MD, [facility] Office: [phone]    TMG/MEDQ  D:  07/23/2014  T:  07/23/2014  Job:  [id number]  cc:   Juanda Chance, N.P.

## 2014-07-23 NOTE — Progress Notes (Signed)
Pt walked the unit x 3, tolerated well.  Pt attempted to urinate and was unsuccessful.

## 2014-07-23 NOTE — Progress Notes (Signed)
Paged Dr. Harlow Asa, still awaiting a call back.

## 2014-07-23 NOTE — Transfer of Care (Signed)
Immediate Anesthesia Transfer of Care Note  Patient: Katrina Young  Procedure(s) Performed: Procedure(s): VENTRAL INCISIONAL HERNIA REPAIR WITH MESH (N/A)  Patient Location: PACU  Anesthesia Type:General  Level of Consciousness: awake, alert  and oriented  Airway & Oxygen Therapy: Patient Spontanous Breathing and Patient connected to face mask oxygen  Post-op Assessment: Report given to PACU RN and Post -op Vital signs reviewed and stable  Post vital signs: Reviewed and stable  Complications: No apparent anesthesia complications

## 2014-07-23 NOTE — Anesthesia Postprocedure Evaluation (Signed)
  Anesthesia Post-op Note  Patient: Katrina Young  Procedure(s) Performed: Procedure(s) (LRB): VENTRAL INCISIONAL HERNIA REPAIR WITH MESH (N/A)  Patient Location: PACU  Anesthesia Type: General  Level of Consciousness: awake and alert   Airway and Oxygen Therapy: Patient Spontanous Breathing  Post-op Pain: mild  Post-op Assessment: Post-op Vital signs reviewed, Patient's Cardiovascular Status Stable, Respiratory Function Stable, Patent Airway and No signs of Nausea or vomiting  Last Vitals:  Filed Vitals:   07/23/14 1245  BP: 145/72  Pulse: 63  Temp: 36.3 C  Resp: 15    Post-op Vital Signs: stable   Complications: No apparent anesthesia complications

## 2014-07-23 NOTE — Progress Notes (Signed)
Pt up walking in short stay department without difficulty.  She feels the urge to void however still unsuccessful.  Bladder scan done and read she has 37mls fluid noted in bladder.  Pt denies pain just the urge to void.  I have paged Dr. Harlow Asa, awaiting call back. Will continue to monitor pt.

## 2014-07-23 NOTE — Progress Notes (Signed)
Call back received from Dr. Excell Seltzer.  Orders received to place Foley catheter and then discharge home. Pt to follow up with her urologist, Dr. Rosana Hoes, tomorrow 07/24/14 for Foley removal.  Coolidge Breeze, RN 07/23/2014

## 2014-07-23 NOTE — Anesthesia Preprocedure Evaluation (Addendum)
Anesthesia Evaluation  Patient identified by MRN, date of birth, ID band Patient awake    Reviewed: Allergy & Precautions, H&P , NPO status , Patient's Chart, lab work & pertinent test results, reviewed documented beta blocker date and time   History of Anesthesia Complications (+) PONV  Airway Mallampati: II  TM Distance: >3 FB Neck ROM: full    Dental  (+) Caps, Dental Advisory Given All the front teeth are bridges and caps:   Pulmonary shortness of breath and with exertion, former smoker,  breath sounds clear to auscultation  Pulmonary exam normal       Cardiovascular Exercise Tolerance: Good hypertension, Pt. on home beta blockers Rhythm:regular Rate:Normal  LBBB   Neuro/Psych negative neurological ROS  negative psych ROS   GI/Hepatic negative GI ROS, Neg liver ROS,   Endo/Other  negative endocrine ROS  Renal/GU negative Renal ROS  negative genitourinary   Musculoskeletal   Abdominal   Peds  Hematology negative hematology ROS (+)   Anesthesia Other Findings   Reproductive/Obstetrics negative OB ROS                            Anesthesia Physical Anesthesia Plan  ASA: III  Anesthesia Plan: General   Post-op Pain Management:    Induction: Intravenous  Airway Management Planned: Oral ETT  Additional Equipment:   Intra-op Plan:   Post-operative Plan: Extubation in OR  Informed Consent: I have reviewed the patients History and Physical, chart, labs and discussed the procedure including the risks, benefits and alternatives for the proposed anesthesia with the patient or authorized representative who has indicated his/her understanding and acceptance.   Dental Advisory Given  Plan Discussed with: CRNA and Surgeon  Anesthesia Plan Comments:         Anesthesia Quick Evaluation

## 2014-07-24 ENCOUNTER — Telehealth (INDEPENDENT_AMBULATORY_CARE_PROVIDER_SITE_OTHER): Payer: Self-pay

## 2014-07-24 NOTE — Telephone Encounter (Signed)
Pt had surgery yesterday had to have a foley cath inserted into her bladder. She was advised to call her urologist, she did so and they said that she was not under their care so she needed to contact us. They told her how to remove the cath herself and she wishes to do so. I told her that I would have to contact you for your orders. I informed the pt that I would call her back when I received your orders. Pt verbalized understanding. Please advise.

## 2014-07-25 ENCOUNTER — Encounter (HOSPITAL_COMMUNITY): Payer: Self-pay | Admitting: Surgery

## 2014-08-07 DIAGNOSIS — R300 Dysuria: Secondary | ICD-10-CM | POA: Diagnosis not present

## 2014-08-07 DIAGNOSIS — R358 Other polyuria: Secondary | ICD-10-CM | POA: Diagnosis not present

## 2014-08-14 DIAGNOSIS — R300 Dysuria: Secondary | ICD-10-CM | POA: Diagnosis not present

## 2014-08-20 ENCOUNTER — Other Ambulatory Visit: Payer: Self-pay | Admitting: Internal Medicine

## 2014-08-20 DIAGNOSIS — R911 Solitary pulmonary nodule: Secondary | ICD-10-CM

## 2014-08-23 ENCOUNTER — Ambulatory Visit
Admission: RE | Admit: 2014-08-23 | Discharge: 2014-08-23 | Disposition: A | Discharge status: Home / Self Care | Payer: Medicare Other | Source: Ambulatory Visit | Attending: Internal Medicine | Admitting: Internal Medicine

## 2014-08-23 DIAGNOSIS — R911 Solitary pulmonary nodule: Secondary | ICD-10-CM | POA: Diagnosis not present

## 2014-08-23 DIAGNOSIS — Z8543 Personal history of malignant neoplasm of ovary: Secondary | ICD-10-CM | POA: Diagnosis not present

## 2014-08-23 DIAGNOSIS — I517 Cardiomegaly: Secondary | ICD-10-CM | POA: Diagnosis not present

## 2014-08-23 DIAGNOSIS — J984 Other disorders of lung: Secondary | ICD-10-CM | POA: Diagnosis not present

## 2014-08-28 DIAGNOSIS — R300 Dysuria: Secondary | ICD-10-CM | POA: Diagnosis not present

## 2014-08-28 DIAGNOSIS — R350 Frequency of micturition: Secondary | ICD-10-CM | POA: Diagnosis not present

## 2014-10-29 DIAGNOSIS — L570 Actinic keratosis: Secondary | ICD-10-CM | POA: Diagnosis not present

## 2014-10-29 DIAGNOSIS — Z08 Encounter for follow-up examination after completed treatment for malignant neoplasm: Secondary | ICD-10-CM | POA: Diagnosis not present

## 2014-10-29 DIAGNOSIS — L820 Inflamed seborrheic keratosis: Secondary | ICD-10-CM | POA: Diagnosis not present

## 2014-10-29 DIAGNOSIS — Z85828 Personal history of other malignant neoplasm of skin: Secondary | ICD-10-CM | POA: Diagnosis not present

## 2014-11-15 DIAGNOSIS — N3020 Other chronic cystitis without hematuria: Secondary | ICD-10-CM | POA: Diagnosis not present

## 2014-11-15 DIAGNOSIS — N3941 Urge incontinence: Secondary | ICD-10-CM | POA: Diagnosis not present

## 2015-01-06 DIAGNOSIS — I10 Essential (primary) hypertension: Secondary | ICD-10-CM | POA: Diagnosis not present

## 2015-01-06 DIAGNOSIS — M859 Disorder of bone density and structure, unspecified: Secondary | ICD-10-CM | POA: Diagnosis not present

## 2015-01-06 DIAGNOSIS — E785 Hyperlipidemia, unspecified: Secondary | ICD-10-CM | POA: Diagnosis not present

## 2015-01-13 DIAGNOSIS — Z1212 Encounter for screening for malignant neoplasm of rectum: Secondary | ICD-10-CM | POA: Diagnosis not present

## 2015-02-04 DIAGNOSIS — R600 Localized edema: Secondary | ICD-10-CM | POA: Diagnosis not present

## 2015-02-04 DIAGNOSIS — C569 Malignant neoplasm of unspecified ovary: Secondary | ICD-10-CM | POA: Diagnosis not present

## 2015-02-04 DIAGNOSIS — Z1389 Encounter for screening for other disorder: Secondary | ICD-10-CM | POA: Diagnosis not present

## 2015-02-04 DIAGNOSIS — I700 Atherosclerosis of aorta: Secondary | ICD-10-CM | POA: Diagnosis not present

## 2015-02-04 DIAGNOSIS — Z0000 Encounter for general adult medical examination without abnormal findings: Secondary | ICD-10-CM | POA: Diagnosis not present

## 2015-02-04 DIAGNOSIS — K219 Gastro-esophageal reflux disease without esophagitis: Secondary | ICD-10-CM | POA: Diagnosis not present

## 2015-02-04 DIAGNOSIS — Z6829 Body mass index (BMI) 29.0-29.9, adult: Secondary | ICD-10-CM | POA: Diagnosis not present

## 2015-02-04 DIAGNOSIS — E785 Hyperlipidemia, unspecified: Secondary | ICD-10-CM | POA: Diagnosis not present

## 2015-02-04 DIAGNOSIS — I10 Essential (primary) hypertension: Secondary | ICD-10-CM | POA: Diagnosis not present

## 2015-02-04 DIAGNOSIS — R918 Other nonspecific abnormal finding of lung field: Secondary | ICD-10-CM | POA: Diagnosis not present

## 2015-02-04 DIAGNOSIS — M859 Disorder of bone density and structure, unspecified: Secondary | ICD-10-CM | POA: Diagnosis not present

## 2015-04-24 DIAGNOSIS — Z23 Encounter for immunization: Secondary | ICD-10-CM | POA: Diagnosis not present

## 2015-04-29 DIAGNOSIS — Z08 Encounter for follow-up examination after completed treatment for malignant neoplasm: Secondary | ICD-10-CM | POA: Diagnosis not present

## 2015-04-29 DIAGNOSIS — L309 Dermatitis, unspecified: Secondary | ICD-10-CM | POA: Diagnosis not present

## 2015-04-29 DIAGNOSIS — L570 Actinic keratosis: Secondary | ICD-10-CM | POA: Diagnosis not present

## 2015-04-29 DIAGNOSIS — Z85828 Personal history of other malignant neoplasm of skin: Secondary | ICD-10-CM | POA: Diagnosis not present

## 2015-05-20 DIAGNOSIS — Z01419 Encounter for gynecological examination (general) (routine) without abnormal findings: Secondary | ICD-10-CM | POA: Diagnosis not present

## 2015-05-20 DIAGNOSIS — Z6828 Body mass index (BMI) 28.0-28.9, adult: Secondary | ICD-10-CM | POA: Diagnosis not present

## 2015-05-20 DIAGNOSIS — N958 Other specified menopausal and perimenopausal disorders: Secondary | ICD-10-CM | POA: Diagnosis not present

## 2015-05-20 DIAGNOSIS — M8588 Other specified disorders of bone density and structure, other site: Secondary | ICD-10-CM | POA: Diagnosis not present

## 2015-05-20 DIAGNOSIS — Z1231 Encounter for screening mammogram for malignant neoplasm of breast: Secondary | ICD-10-CM | POA: Diagnosis not present

## 2015-05-26 ENCOUNTER — Other Ambulatory Visit: Payer: Self-pay | Admitting: Obstetrics & Gynecology

## 2015-05-26 DIAGNOSIS — R928 Other abnormal and inconclusive findings on diagnostic imaging of breast: Secondary | ICD-10-CM

## 2015-05-29 ENCOUNTER — Ambulatory Visit
Admission: RE | Admit: 2015-05-29 | Discharge: 2015-05-29 | Disposition: A | Discharge status: Home / Self Care | Payer: Medicare Other | Source: Ambulatory Visit | Attending: Obstetrics & Gynecology | Admitting: Obstetrics & Gynecology

## 2015-05-29 DIAGNOSIS — N6489 Other specified disorders of breast: Secondary | ICD-10-CM | POA: Diagnosis not present

## 2015-05-29 DIAGNOSIS — R928 Other abnormal and inconclusive findings on diagnostic imaging of breast: Secondary | ICD-10-CM | POA: Diagnosis not present

## 2015-07-11 DIAGNOSIS — H2513 Age-related nuclear cataract, bilateral: Secondary | ICD-10-CM | POA: Diagnosis not present

## 2015-07-11 DIAGNOSIS — H524 Presbyopia: Secondary | ICD-10-CM | POA: Diagnosis not present

## 2015-07-11 DIAGNOSIS — H11041 Peripheral pterygium, stationary, right eye: Secondary | ICD-10-CM | POA: Diagnosis not present

## 2015-07-11 DIAGNOSIS — H43811 Vitreous degeneration, right eye: Secondary | ICD-10-CM | POA: Diagnosis not present

## 2015-10-15 ENCOUNTER — Other Ambulatory Visit: Payer: Self-pay | Admitting: Obstetrics & Gynecology

## 2015-10-15 DIAGNOSIS — N63 Unspecified lump in unspecified breast: Secondary | ICD-10-CM

## 2015-11-10 DIAGNOSIS — L570 Actinic keratosis: Secondary | ICD-10-CM | POA: Diagnosis not present

## 2015-11-10 DIAGNOSIS — L812 Freckles: Secondary | ICD-10-CM | POA: Diagnosis not present

## 2015-11-17 DIAGNOSIS — N3020 Other chronic cystitis without hematuria: Secondary | ICD-10-CM | POA: Diagnosis not present

## 2015-11-17 DIAGNOSIS — N3941 Urge incontinence: Secondary | ICD-10-CM | POA: Diagnosis not present

## 2015-12-03 ENCOUNTER — Ambulatory Visit
Admission: RE | Admit: 2015-12-03 | Discharge: 2015-12-03 | Disposition: A | Discharge status: Home / Self Care | Payer: Medicare Other | Source: Ambulatory Visit | Attending: Obstetrics & Gynecology | Admitting: Obstetrics & Gynecology

## 2015-12-03 ENCOUNTER — Other Ambulatory Visit: Payer: Self-pay | Admitting: Obstetrics & Gynecology

## 2015-12-03 DIAGNOSIS — N63 Unspecified lump in unspecified breast: Secondary | ICD-10-CM

## 2015-12-08 ENCOUNTER — Other Ambulatory Visit: Payer: Self-pay | Admitting: Obstetrics & Gynecology

## 2015-12-08 DIAGNOSIS — N63 Unspecified lump in unspecified breast: Secondary | ICD-10-CM

## 2015-12-09 ENCOUNTER — Ambulatory Visit
Admission: RE | Admit: 2015-12-09 | Discharge: 2015-12-09 | Disposition: A | Discharge status: Home / Self Care | Payer: Medicare Other | Source: Ambulatory Visit | Attending: Obstetrics & Gynecology | Admitting: Obstetrics & Gynecology

## 2015-12-09 DIAGNOSIS — N63 Unspecified lump in unspecified breast: Secondary | ICD-10-CM

## 2015-12-09 DIAGNOSIS — C50512 Malignant neoplasm of lower-outer quadrant of left female breast: Secondary | ICD-10-CM | POA: Diagnosis not present

## 2015-12-09 HISTORY — PX: BREAST BIOPSY: SHX20

## 2015-12-11 ENCOUNTER — Telehealth: Payer: Self-pay | Admitting: *Deleted

## 2015-12-11 ENCOUNTER — Encounter: Payer: Self-pay | Admitting: *Deleted

## 2015-12-11 DIAGNOSIS — C50512 Malignant neoplasm of lower-outer quadrant of left female breast: Secondary | ICD-10-CM | POA: Insufficient documentation

## 2015-12-11 HISTORY — DX: Malignant neoplasm of lower-outer quadrant of left female breast: C50.512

## 2015-12-11 NOTE — Telephone Encounter (Signed)
Confirmed [facility] for 12/17/15 at 0815 .  Instructions and contact information given.

## 2015-12-17 ENCOUNTER — Other Ambulatory Visit: Payer: Medicare Other

## 2015-12-17 ENCOUNTER — Ambulatory Visit (HOSPITAL_BASED_OUTPATIENT_CLINIC_OR_DEPARTMENT_OTHER): Payer: Medicare Other | Admitting: Hematology and Oncology

## 2015-12-17 ENCOUNTER — Ambulatory Visit: Payer: Medicare Other | Attending: Surgery | Admitting: Physical Therapy

## 2015-12-17 ENCOUNTER — Encounter: Payer: Self-pay | Admitting: Physical Therapy

## 2015-12-17 ENCOUNTER — Other Ambulatory Visit (HOSPITAL_BASED_OUTPATIENT_CLINIC_OR_DEPARTMENT_OTHER): Payer: Medicare Other

## 2015-12-17 ENCOUNTER — Encounter: Payer: Self-pay | Admitting: Hematology and Oncology

## 2015-12-17 ENCOUNTER — Ambulatory Visit
Admission: RE | Admit: 2015-12-17 | Discharge: 2015-12-17 | Disposition: A | Discharge status: Home / Self Care | Payer: Medicare Other | Source: Ambulatory Visit | Attending: Radiation Oncology | Admitting: Radiation Oncology

## 2015-12-17 ENCOUNTER — Encounter: Payer: Self-pay | Admitting: Genetic Counselor

## 2015-12-17 ENCOUNTER — Ambulatory Visit: Payer: Medicare Other

## 2015-12-17 ENCOUNTER — Ambulatory Visit: Payer: Self-pay | Admitting: Surgery

## 2015-12-17 ENCOUNTER — Encounter: Payer: Self-pay | Admitting: Skilled Nursing Facility1

## 2015-12-17 ENCOUNTER — Ambulatory Visit (HOSPITAL_BASED_OUTPATIENT_CLINIC_OR_DEPARTMENT_OTHER): Payer: Medicare Other | Admitting: Genetic Counselor

## 2015-12-17 VITALS — BP 134/74 | HR 58 | Temp 98.1°F | Resp 19 | Ht 63.0 in | Wt 167.0 lb

## 2015-12-17 DIAGNOSIS — M25612 Stiffness of left shoulder, not elsewhere classified: Secondary | ICD-10-CM | POA: Diagnosis not present

## 2015-12-17 DIAGNOSIS — C50512 Malignant neoplasm of lower-outer quadrant of left female breast: Secondary | ICD-10-CM

## 2015-12-17 DIAGNOSIS — Z808 Family history of malignant neoplasm of other organs or systems: Secondary | ICD-10-CM | POA: Diagnosis not present

## 2015-12-17 DIAGNOSIS — Z8543 Personal history of malignant neoplasm of ovary: Secondary | ICD-10-CM

## 2015-12-17 DIAGNOSIS — C50412 Malignant neoplasm of upper-outer quadrant of left female breast: Secondary | ICD-10-CM | POA: Diagnosis not present

## 2015-12-17 DIAGNOSIS — C569 Malignant neoplasm of unspecified ovary: Secondary | ICD-10-CM | POA: Diagnosis not present

## 2015-12-17 DIAGNOSIS — Z803 Family history of malignant neoplasm of breast: Secondary | ICD-10-CM | POA: Diagnosis not present

## 2015-12-17 DIAGNOSIS — R293 Abnormal posture: Secondary | ICD-10-CM | POA: Insufficient documentation

## 2015-12-17 DIAGNOSIS — Z8042 Family history of malignant neoplasm of prostate: Secondary | ICD-10-CM

## 2015-12-17 DIAGNOSIS — Z315 Encounter for genetic counseling: Secondary | ICD-10-CM | POA: Diagnosis not present

## 2015-12-17 DIAGNOSIS — Z800 Family history of malignant neoplasm of digestive organs: Secondary | ICD-10-CM | POA: Diagnosis not present

## 2015-12-17 DIAGNOSIS — Z87891 Personal history of nicotine dependence: Secondary | ICD-10-CM | POA: Diagnosis not present

## 2015-12-17 DIAGNOSIS — C50912 Malignant neoplasm of unspecified site of left female breast: Secondary | ICD-10-CM | POA: Diagnosis not present

## 2015-12-17 DIAGNOSIS — Z806 Family history of leukemia: Secondary | ICD-10-CM

## 2015-12-17 DIAGNOSIS — C50122 Malignant neoplasm of central portion of left male breast: Secondary | ICD-10-CM

## 2015-12-17 LAB — CBC WITH DIFFERENTIAL/PLATELET
BASO%: 0.9 % (ref 0.0–2.0)
Basophils Absolute: 0 10*3/uL (ref 0.0–0.1)
EOS%: 1.5 % (ref 0.0–7.0)
Eosinophils Absolute: 0.1 10*3/uL (ref 0.0–0.5)
HCT: 39.2 % (ref 34.8–46.6)
HGB: 13.3 g/dL (ref 11.6–15.9)
LYMPH%: 27.3 % (ref 14.0–49.7)
MCH: 29.4 pg (ref 25.1–34.0)
MCHC: 33.8 g/dL (ref 31.5–36.0)
MCV: 86.9 fL (ref 79.5–101.0)
MONO#: 0.4 10*3/uL (ref 0.1–0.9)
MONO%: 8 % (ref 0.0–14.0)
NEUT#: 3.3 10*3/uL (ref 1.5–6.5)
NEUT%: 62.3 % (ref 38.4–76.8)
Platelets: 203 10*3/uL (ref 145–400)
RBC: 4.51 10*6/uL (ref 3.70–5.45)
RDW: 12.6 % (ref 11.2–14.5)
WBC: 5.3 10*3/uL (ref 3.9–10.3)
lymph#: 1.4 10*3/uL (ref 0.9–3.3)

## 2015-12-17 LAB — COMPREHENSIVE METABOLIC PANEL
ALT: 23 U/L (ref 0–55)
AST: 24 U/L (ref 5–34)
Albumin: 4.2 g/dL (ref 3.5–5.0)
Alkaline Phosphatase: 104 U/L (ref 40–150)
Anion Gap: 10 mEq/L (ref 3–11)
BUN: 17 mg/dL (ref 7.0–26.0)
CO2: 25 mEq/L (ref 22–29)
Calcium: 9.8 mg/dL (ref 8.4–10.4)
Chloride: 108 mEq/L (ref 98–109)
Creatinine: 0.8 mg/dL (ref 0.6–1.1)
EGFR: 69 mL/min/{1.73_m2} — ABNORMAL LOW (ref >90)
Glucose: 109 mg/dl (ref 70–140)
Potassium: 4.4 mEq/L (ref 3.5–5.1)
Sodium: 144 mEq/L (ref 136–145)
Total Bilirubin: 0.59 mg/dL (ref 0.20–1.20)
Total Protein: 7.4 g/dL (ref 6.4–8.3)

## 2015-12-17 NOTE — Patient Instructions (Signed)

## 2015-12-17 NOTE — H&P (Signed)
nnie L. Ratledge 12/17/2015 7:57 AM Location: [facility] Patient #: [id number] DOB: 12/22/1940 Married / Language: English / Race: White Female  History of Present Illness Marcello Moores A. Keniesha Adderly MD; 12/17/2015 10:43 AM) Patient words: patient sent at the requestof Dr. 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 Patient noticed a small mass 4 months ago but no nipple discharge.  The patient is a 75 year old female.   Other Problems Tawni Pummel, RN; 12/17/2015 7:58 AM) Anxiety Disorder Arthritis Back Pain Bladder Problems Breast Cancer Cholelithiasis Gastroesophageal Reflux Disease Hemorrhoids High blood pressure Hypercholesterolemia Lump In Breast Migraine Headache Oophorectomy Bilateral. Ovarian Cancer Ventral Hernia Repair  Past Surgical History Tawni Pummel, RN; 12/17/2015 7:58 AM) Breast Biopsy Left, Right. Gallbladder Surgery - Laparoscopic Hysterectomy (due to cancer) - Complete Ventral / Umbilical Hernia Surgery Left.  Diagnostic Studies History Tawni Pummel, RN; 12/17/2015 7:58 AM) Colonoscopy 1-5 years ago >10 years ago Mammogram within last year Pap Smear 1-5 years ago  Medication History Tawni Pummel, RN; 12/17/2015 7:57 AM) Medications Reconciled  Social History Tawni Pummel, RN; 12/17/2015 7:58 AM) Caffeine use Coffee. No alcohol use No drug use Tobacco use Former smoker, Never smoker.  Family History Tawni Pummel, RN; 12/17/2015 7:58 AM) Alcohol Abuse Brother, Father. Arthritis Brother, Mother, Sister. Breast Cancer Sister. Cerebrovascular Accident Brother. Colon Cancer Sister. Diabetes Mellitus Family Members In [place], Sister, Son. Heart Disease Brother, Father, Mother. Hypertension Father, Mother. Kidney Disease Sister. Melanoma Brother, Family Members In  General. Prostate Cancer Brother.  Pregnancy / Birth History Tawni Pummel, RN; 12/17/2015 7:58 AM) Age at menarche 55 years. Gravida 3 Irregular periods Maternal age 72-30 21-25 Para 3     Review of Systems Tawni Pummel RN; 12/17/2015 7:58 AM) General Not Present- Appetite Loss, Chills, Fatigue, Fever, Night Sweats, Weight Gain and Weight Loss. Skin Present- Dryness, New Lesions and Non-Healing Wounds. Not Present- Change in Wart/Mole, Hives, Jaundice, Rash and Ulcer. HEENT Present- Hearing Loss, Nose Bleed, Ringing in the Ears, Seasonal Allergies and Wears glasses/contact lenses. Not Present- Earache, Hoarseness, Oral Ulcers, Sinus Pain, Sore Throat, Visual Disturbances and Yellow Eyes. Respiratory Present- Chronic Cough and Snoring. Not Present- Bloody sputum, Difficulty Breathing and Wheezing. Breast Present- Breast Mass. Not Present- Breast Pain, Nipple Discharge and Skin Changes. Cardiovascular Present- Swelling of Extremities. Not Present- Chest Pain, Difficulty Breathing Lying Down, Leg Cramps, Palpitations, Rapid Heart Rate and Shortness of Breath. Gastrointestinal Present- Constipation, Difficulty Swallowing, Gets full quickly at meals, Hemorrhoids and Indigestion. Not Present- Abdominal Pain, Bloating, Bloody Stool, Change in Bowel Habits, Chronic diarrhea, Excessive gas, Nausea, Rectal Pain and Vomiting. Female Genitourinary Present- Frequency. Not Present- Nocturia, Painful Urination, Pelvic Pain and Urgency. Musculoskeletal Present- Back Pain, Joint Pain and Joint Stiffness. Not Present- Muscle Pain, Muscle Weakness and Swelling of Extremities. Psychiatric Present- Anxiety. Not Present- Bipolar, Change in Sleep Pattern, Depression, Fearful and Frequent crying. Endocrine Present- Cold Intolerance and Heat Intolerance. Not Present- Excessive Hunger, Hair Changes, Hot flashes and New Diabetes. Hematology Present- Easy Bruising. Not Present- Excessive bleeding, Gland  problems, HIV and Persistent Infections.   Physical Exam (Caesar Mannella A. Daryle Amis MD; 12/17/2015 10:43 AM)  General Mental Status-Alert. General Appearance-Consistent with stated age. Hydration-Well hydrated. Voice-Normal.  Eye Eyeball - Bilateral-Extraocular movements intact. Sclera/Conjunctiva - Bilateral-No scleral icterus.  Chest and Lung Exam Chest and lung exam reveals -quiet, even and easy respiratory effort with no use of accessory muscles and  on auscultation, normal breath sounds, no adventitious sounds and normal vocal resonance. Inspection Chest Wall - Normal. Back - normal.  Breast Breast - Left-Symmetric, Non Tender, No Biopsy scars, no Dimpling, No Inflammation, No Lumpectomy scars, No Mastectomy scars, No Peau d' Orange. Breast - Right-Symmetric, Non Tender, No Biopsy scars, no Dimpling, No Inflammation, No Lumpectomy scars, No Mastectomy scars, No Peau d' Orange. Breast Lump-No Palpable Breast Mass. Note: bruising left breast  Cardiovascular Cardiovascular examination reveals -normal heart sounds, regular rate and rhythm with no murmurs and normal pedal pulses bilaterally.  Neurologic Neurologic evaluation reveals -alert and oriented x 3 with no impairment of recent or remote memory. Mental Status-Normal.  Musculoskeletal Normal Exam - Left-Upper Extremity Strength Normal and Lower Extremity Strength Normal. Normal Exam - Right-Upper Extremity Strength Normal and Lower Extremity Strength Normal.  Lymphatic Head & Neck  General Head & Neck Lymphatics: Bilateral - Description - Normal. Axillary  General Axillary Region: Bilateral - Description - Normal. Tenderness - Non Tender.    Assessment & Plan (Eura Radabaugh A. Ryin Ambrosius MD; 12/17/2015 10:45 AM)  BREAST CANCER, LEFT (C50.912) Impression: discussed breast conservation versus mastectomy with reconstruction. She has oaxillary sentinel lymph node mapping.my with left axillary sentinel  lymph node mapping. Risk of lumpectomy include bleeding, infection, seroma, more surgery, use of seed/wire, wound care, cosmetic deformity and the need for other treatments, death , blood clots, death. Pt agrees to proceed. Risk of sentinel lymph node mapping include bleeding, infection, lymphedema, shoulder pain. stiffness, dye allergy. cosmetic deformity , blood clots, death, need for more surgery. Pt agres to proceed.  Current Plans You are being scheduled for surgery - Our schedulers will call you.  You should hear from our office's scheduling department within 5 working days about the location, date, and time of surgery. We try to make accommodations for patient's preferences in scheduling surgery, but sometimes the OR schedule or the surgeon's schedule prevents [place] from making those accommodations.  If you have not heard from our office [phone]) in 5 working days, call the office and ask for your surgeon's nurse.  If you have other questions about your diagnosis, plan, or surgery, call the office and ask for your surgeon's nurse.  Pt Education - [facility] Breast Cancer Information Given - [facility] "Breast Journey" Package We discussed the staging and pathophysiology of breast cancer. We discussed all of the different options for treatment for breast cancer including surgery, chemotherapy, radiation therapy, Herceptin, and antiestrogen therapy. We discussed a sentinel lymph node biopsy as she does not appear to having lymph node involvement right now. We discussed the performance of that with injection of radioactive tracer and blue dye. We discussed that she would have an incision underneath her axillary hairline. We discussed that there is a bout a 10-20% chance of having a positive node with a sentinel lymph node biopsy and we will await the permanent pathology to make any other first further decisions in terms of her treatment. One of these options might be to return to the operating room to  perform an axillary lymph node dissection. We discussed about a 1-2% risk lifetime of chronic shoulder pain as well as lymphedema associated with a sentinel lymph node biopsy. We discussed the options for treatment of the breast cancer which included lumpectomy versus a mastectomy. We discussed the performance of the lumpectomy with a wire placement. We discussed a 10-20% chance of a positive margin requiring reexcision in the operating room. We also discussed that she may need radiation therapy  or antiestrogen therapy or both if she undergoes lumpectomy. We discussed the mastectomy and the postoperative care for that as well. We discussed that there is no difference in her survival whether she undergoes lumpectomy with radiation therapy or antiestrogen therapy versus a mastectomy. There is a slight difference in the local recurrence rate being 3-5% with lumpectomy and about 1% with a mastectomy. We discussed the risks of operation including bleeding, infection, possible reoperation. She understands her further therapy will be based on what her stages at the time of her operation.  Pt Education - [facility] breast cancer surgery: discussed with patient and provided information. Pt Education - [facility] Breast Biopsy HCI: discussed with patient and provided information. Pt Education - ABC (After Breast Cancer) Class Info: discussed with patient and provided information.

## 2015-12-17 NOTE — Progress Notes (Signed)
[name], [name], [name], [name], [name] Patient felt a lump in the left breast and underwent a mammogram and ultrasound. The ultrasound revealed a 1.7 cm lesion maxilla was negative. Biopsy revealed a grade 2 invasive ductal carcinoma with DCIS that was ER/PR positive HER-2 negative with a Ki-67 15%. She had a prior history of ovarian cancer at age 80 and underwent nephrectomy with hysterectomy. Apparently the cancer cells were found in the peritoneal lavage. She did not need chemotherapy or radiation. She has not had genetic testing. She is here today accompanied by her family including her husband and 2 daughters and grandchildren to discuss the treatment plan.  I reviewed her records extensively and collaborated the history with the patient.  SUMMARY OF ONCOLOGIC HISTORY:   Breast cancer of lower-outer quadrant of left female breast ([place])   12/11/2015 Initial Diagnosis Left breast mass at 3:00: 1.7 x 1.6 x 1.2 cm, axilla negative, grade 2 IDC with DCIS, ER/PR positive HER-2 negative Ki-67 15%, T1 cN0 stage IA clinical stage    In terms of breast cancer risk profile:  She menarched at early age of 58 and went to menopause at age 60  She had 3 pregnancy, her first child was born at age 70  She has received birth control pills for approximately 6 years.  She was exposed to hormone replacement therapy for 10-15 years.  She has has family history of cancers Brother leukemia, another brother melanoma, sister age 51 breast cancer, brother age 60  prostate cancer, sister age 65 colon cancer, brother age 58 melanoma  MEDICAL HISTORY:  Past Medical History  Diagnosis Date  . Weight gain   . Reflux   . Constipation   . Diminished eyesight change in eyesight    cataracts  . 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 Joint pain   . Hypertension   . Hyperlipidemia   . Varicose veins   . Leg pain   . Complication of anesthesia   . PONV (postoperative nausea and vomiting)   . Shortness of breath dyspnea     increased walking or climbing stairs  . GERD (gastroesophageal reflux disease)   . Arthritis   . Cancer ([place])     ovarian cancer (hysterectomy-12-14 years ago) no txs  . Breast cancer of lower-outer quadrant of left female breast ([place]) 12/11/2015  . Breast cancer [facility])     SURGICAL HISTORY: Past Surgical History  Procedure Laterality Date  . Evla   RIGHT SMALL SAPHENOUS VEIN 09-13-2007  . Evla  R  GSV  02-10-2011  . Shoulder surgery      bilat - rotaor cuff repair secondary to tear  . Bladder surgery    . Endovenous ablation saphenous vein w/ laser  12-30-2011    left greater saphenous vein  . Tonsillectomy    . Colonscopy     . Breast surgery      hx of removal of calcum build up -left breast   . Abdominal hysterectomy      total hx of ovarian cancer   . Cholecystectomy    . Ventral hernia repair  N/A 07/23/2014    Procedure: VENTRAL INCISIONAL HERNIA REPAIR WITH MESH;  Surgeon: Armandina Gemma, MD;  Location: [facility];  Service: General;  Laterality: N/A;    SOCIAL HISTORY: Social History   Social History  . Marital Status: Married    Spouse Name: N/A  . Number of Children: N/A  . Years of Education: N/A   Occupational History  . Not on file.   Social History Main Topics  . Smoking status: Former Smoker -- 2 years    Types: Cigarettes    Quit date: 07/26/1966  . Smokeless tobacco: Never Used  . Alcohol Use: No  . Drug Use: No  . Sexual Activity: Not on file   Other Topics Concern  . Not on file    Social History Narrative    FAMILY HISTORY: Family History  Problem Relation Age of Onset  . Heart disease Mother   . Hypertension Father   . Breast cancer Sister   . Leukemia Brother   . Melanoma Brother   . Prostate cancer Brother   . Colon cancer Sister   . Melanoma Brother     ALLERGIES:  is allergic to codeine and morphine and related.  MEDICATIONS:  Current Outpatient Prescriptions  Medication Sig Dispense Refill  . aspirin EC 81 MG tablet Take 81 mg by mouth daily.    Marland Kitchen atenolol (TENORMIN) 50 MG tablet Take 50 mg by mouth every morning.     . cholecalciferol (VITAMIN D) 1000 UNITS tablet Take 1,000 Units by mouth daily.    Marland Kitchen esomeprazole (NEXIUM) 40 MG capsule Take 40 mg by mouth daily before breakfast.     . LORazepam (ATIVAN) 1 MG tablet Take 1 mg by mouth 2 (two) times daily as needed for anxiety.     Marland Kitchen losartan (COZAAR) 100 MG tablet     . Multiple Vitamin (MULTIVITAMIN WITH MINERALS) TABS tablet Take 1 tablet by mouth daily.    . Omega 3 1000 MG CAPS Take 1 capsule by mouth daily.    . Phenazopyridine HCl (AZO TABS PO) Take 2 tablets by mouth daily.     . pravastatin (PRAVACHOL) 20 MG tablet Take 20 mg by mouth daily.     . vitamin B-12 (CYANOCOBALAMIN) 1000 MCG tablet Take 1,000 mcg by mouth daily.     No current facility-administered medications for this visit.    REVIEW OF SYSTEMS:   Constitutional: Denies fevers, chills or abnormal night sweats Eyes: Denies blurriness of vision, double vision or watery eyes Ears, nose, mouth, throat, and face: Denies mucositis or sore throat Respiratory: Denies cough, dyspnea or wheezes Cardiovascular: Denies palpitation, chest discomfort or lower extremity swelling Gastrointestinal:  Denies nausea, heartburn or change in bowel habits Skin: Denies abnormal skin rashes Lymphatics: Denies new lymphadenopathy or easy bruising Neurological:Denies numbness, tingling or new weaknesses Behavioral/Psych: Mood is stable, no  new changes  Breast:Palpable lump in the left breast All other systems were reviewed with the patient and are negative.  PHYSICAL EXAMINATION: ECOG PERFORMANCE STATUS: 0 - Asymptomatic  Filed Vitals:   12/17/15 0825  BP: 134/74  Pulse: 58  Temp: 98.1 F (36.7 C)  Resp: 19   Filed Weights   12/17/15 0825  Weight: 167 lb (75.751 kg)    GENERAL:alert, no distress and comfortable SKIN: skin color, texture, turgor are normal, no rashes or significant lesions EYES: normal, conjunctiva are pink and non-injected, sclera clear OROPHARYNX:no exudate, no erythema and lips, buccal mucosa, and tongue normal  NECK: supple, thyroid normal  size, non-tender, without nodularity LYMPH:  no palpable lymphadenopathy in the cervical, axillary or inguinal LUNGS: clear to auscultation and percussion with normal breathing effort HEART: regular rate & rhythm and no murmurs and no lower extremity edema ABDOMEN:abdomen soft, non-tender and normal bowel sounds Musculoskeletal:no cyanosis of digits and no clubbing  PSYCH: alert & oriented x 3 with fluent speech NEURO: no focal motor/sensory deficits BREAST: Palpable lump in the left breast. No palpable axillary or supraclavicular lymphadenopathy (exam performed in the presence of a chaperone)   LABORATORY DATA:  I have reviewed the data as listed Lab Results  Component Value Date   WBC 5.3 12/17/2015   HGB 13.3 12/17/2015   HCT 39.2 12/17/2015   MCV 86.9 12/17/2015   PLT 203 12/17/2015   Lab Results  Component Value Date   NA 144 12/17/2015   K 4.4 12/17/2015   CL 106 07/15/2014   CO2 25 12/17/2015    RADIOGRAPHIC STUDIES: I have personally reviewed the radiological reports and agreed with the findings in the report.  ASSESSMENT AND PLAN:  Breast cancer of lower-outer quadrant of left female breast ([place]) Screening detected right breast distortion retroareolar 1.3 x 1.3 x 0.8 cm, skin and nipple suspicious: Biopsy 12/11/2015 grade 1 IDC ER  95%, PR 90%, HER-2 negative ratio 1.28, Ki-67 15% T1 cN0 stage IA clinical stage  Pathology and radiology counseling:Discussed with the patient, the details of pathology including the type of breast cancer,the clinical staging, the significance of ER, PR and HER-2/neu receptors and the implications for treatment. After reviewing the pathology in detail, we proceeded to discuss the different treatment options between surgery, radiation, chemotherapy, antiestrogen therapies.  Recommendations:Genetic testing 1. Breast conserving surgery followed by 2. Oncotype DX testing to determine if chemotherapy would be of any benefit followed by 3. Adjuvant radiation therapy followed by 4. Adjuvant antiestrogen therapy  Oncotype counseling: I discussed Oncotype DX test. I explained to the patient that this is a 21 gene panel to evaluate patient tumors DNA to calculate recurrence score. This would help determine whether patient has high risk or intermediate risk or low risk breast cancer. She understands that if her tumor was found to be high risk, she would benefit from systemic chemotherapy. If low risk, no need of chemotherapy. If she was found to be intermediate risk, we would need to evaluate the score as well as other risk factors and determine if an abbreviated chemotherapy may be of benefit.  Return to clinic after surgery to discuss final pathology report and then determine if Oncotype DX testing will need to be sent.  All questions were answered. The patient knows to call the clinic with any problems, questions or concerns.    Rulon Eisenmenger, MD 12/17/2015

## 2015-12-17 NOTE — Therapy (Signed)
[place], [place], [address] Phone: [phone]   Fax:  [phone]  Physical Therapy Evaluation  Patient Details  Name: Katrina Young MRN: [id number] Date of Birth: 1940-09-13 Referring Provider: Dr. Erroll Luna  Encounter Date: 12/17/2015      PT End of Session - 12/17/15 1133    Visit Number 1   Number of Visits 1   PT Start Time 1040   PT Stop Time 1100   PT Time Calculation (min) 20 min   Activity Tolerance Patient tolerated treatment well   Behavior During Therapy [facility] for tasks assessed/performed      Past Medical History  Diagnosis Date  . Weight gain   . Reflux   . Constipation   . Diminished eyesight change in eyesight    cataracts  . 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 Joint pain   . Hypertension   . Hyperlipidemia   . Varicose veins   . Leg pain   . Complication of anesthesia   . PONV (postoperative nausea and vomiting)   . Shortness of breath dyspnea     increased walking or climbing stairs  . GERD (gastroesophageal reflux disease)   . Arthritis   . Cancer ([place])     ovarian cancer (hysterectomy-12-14 years ago) no txs  . Breast cancer of lower-outer quadrant of left female breast ([place]) 12/11/2015  . Breast cancer [facility])     Past Surgical History  Procedure Laterality Date  . Evla   RIGHT SMALL SAPHENOUS VEIN 09-13-2007  . Evla  R  GSV  02-10-2011  . Shoulder surgery      bilat - rotaor cuff repair secondary to tear  . Bladder surgery    . Endovenous ablation saphenous vein w/ laser  12-30-2011    left greater saphenous vein  . Tonsillectomy    . Colonscopy     . Breast surgery      hx of removal of calcum build up -left breast   . Abdominal hysterectomy      total hx of ovarian cancer   . Cholecystectomy    . Ventral hernia repair N/A 07/23/2014    Procedure: VENTRAL INCISIONAL HERNIA REPAIR WITH MESH;  Surgeon: Armandina Gemma, MD;  Location: [facility];   Service: General;  Laterality: N/A;    There were no vitals filed for this visit.       Subjective Assessment - 12/17/15 1139    Subjective Patient reports she is here today to be seen by her medical team for her newly diagnosed left breast cancer.   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  It is located in the upper outer quadrant and measures 1.7 cm.  It is ER/PR positive and HER2 negative with a Ki67 of 15%.  Her multidisciplinary medical team met prior to her assessment to determine a recommended treatment plan.   Patient Stated Goals Reduce lymphedema risk and learn post op shoulder ROM HEP.            [facility] PT Assessment - 12/17/15 0001    Assessment   Medical Diagnosis Left breast cancer   Referring Provider Dr. Marcello Moores [name]   Onset Date/Surgical Date 12/03/15   Hand Dominance Right   Prior Therapy none   Precautions   Precautions Other (comment)   Precaution Comments Active cancer; hx ovarian cancer 1998   Restrictions  Weight Bearing Restrictions No   Balance Screen   Has the patient fallen in the past 6 months No   Has the patient had a decrease in activity level because of a fear of falling?  No   Is the patient reluctant to leave their home because of a fear of falling?  No   Home Ecologist residence   Living Arrangements Spouse/significant other   Available Help at Discharge Family   Prior Function   Level of [place] Retired   Leisure She does not exercise but reports being very activity and does yeard work   Charity fundraiser Status Within Functional Limits for tasks assessed   Posture/Postural Control   Posture/Postural Control Postural limitations   Postural Limitations Rounded Shoulders;Forward head   ROM / Strength   AROM / PROM / Strength AROM;Strength   AROM   AROM  Assessment Site Shoulder;Cervical   Right/Left Shoulder Right;Left   Right Shoulder Extension 45 Degrees   Right Shoulder Flexion 140 Degrees  Stiff due to previous rotator cuff repair   Right Shoulder ABduction 172 Degrees   Right Shoulder Internal Rotation 70 Degrees   Right Shoulder External Rotation 65 Degrees   Left Shoulder Extension 53 Degrees   Left Shoulder Flexion 126 Degrees  with c/o stiffness   Left Shoulder ABduction 142 Degrees   Left Shoulder Internal Rotation 55 Degrees   Left Shoulder External Rotation 76 Degrees   Cervical Flexion WNL   Cervical Extension 25% limited   Cervical - Right Side Bend 25% limited   Cervical - Left Side Bend 25% limited   Cervical - Right Rotation 25% limited   Cervical - Left Rotation 25% limited   Strength   Overall Strength Within functional limits for tasks performed           LYMPHEDEMA/ONCOLOGY QUESTIONNAIRE - 12/17/15 1131    Type   Cancer Type Left breast cancer   Lymphedema Assessments   Lymphedema Assessments Upper extremities   Right Upper Extremity Lymphedema   10 cm Proximal to Olecranon Process 28 cm   Olecranon Process 26.1 cm   10 cm Proximal to Ulnar Styloid Process 22.4 cm   Just Proximal to Ulnar Styloid Process 16.6 cm   Across Hand at [employer] 19.1 cm   At [place] of 2nd Digit 6.5 cm   Left Upper Extremity Lymphedema   10 cm Proximal to Olecranon Process 30 cm   Olecranon Process 27.5 cm   10 cm Proximal to Ulnar Styloid Process 22.4 cm   Just Proximal to Ulnar Styloid Process 17.5 cm   Across Hand at [employer] 19.4 cm   At [place] of 2nd Digit 6.3 cm      Patient was instructed today in a home exercise program today for post op shoulder range of motion. These included active assist shoulder flexion in sitting, scapular retraction, wall walking with shoulder abduction, and hands behind head external rotation.  She was encouraged to do these twice a day, holding 3 seconds and repeating 5 times  when permitted by her physician.         PT Education - 12/17/15 1132    Education provided Yes   Education Details Lymphedema risk reduction and post op shoulder ROM HEP   Person(s) Educated Patient;Spouse  6 family members present   Methods Explanation;Demonstration;Handout   Comprehension Returned demonstration;Verbalized understanding  Breast Clinic Goals - 12/17/15 1139    Patient will be able to verbalize understanding of pertinent lymphedema risk reduction practices relevant to her diagnosis specifically related to skin care.   Time 1   Period Days   Status Achieved   Patient will be able to return demonstrate and/or verbalize understanding of the post-op home exercise program related to regaining shoulder range of motion.   Time 1   Period Days   Status Achieved   Patient will be able to verbalize understanding of the importance of attending the postoperative After Breast Cancer Class for further lymphedema risk reduction education and therapeutic exercise.   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  It is located in the upper outer quadrant and measures 1.7 cm.  It is ER/PR positive and HER2 negative with a Ki67 of 15%.  Her multidisciplinary medical team met prior to her assessment to determine a recommended treatment plan.  She is planning to have a left lumpectomy and sentinel node biopsy followed by Oncotype testing, radiation and anti-estrogen therapy.  She may benefit from post op PT to regain shoulder ROM and reduce lympehdema risk.  Having previous bilateral rotator cuff repairs increases her risk of having difficulty regaining shoulder ROM post operatively.  Due to having only the rotator cuff comorbidity and no other factors, her evaluation is of low complexity.   Rehab Potential Excellent    Clinical Impairments Affecting Rehab Potential Previous rotator cuff repair   PT Frequency One time visit   PT Treatment/Interventions Therapeutic exercise;Patient/family education   PT Next Visit Plan Will f/u after surgery to determine needs   PT Home Exercise Plan Shoulder ROM HEP   Consulted and Agree with Plan of Care Patient;Family member/caregiver   Family Member Consulted Husband and 5 other family members      Patient will benefit from skilled therapeutic intervention in order to improve the following deficits and impairments:  Pain, Decreased knowledge of precautions, Decreased range of motion, Impaired UE functional use, Decreased strength  Visit Diagnosis: Abnormal posture - Plan: PT plan of care cert/re-cert  Stiffness of left shoulder, not elsewhere classified - Plan: PT plan of care cert/re-cert  Carcinoma of upper-outer quadrant of left female breast ([place]) - Plan: PT plan of care cert/re-cert      G-Codes - 83/25/49 1139    Functional Assessment Tool Used Clinical Judgement   Functional Limitation Other PT subsequent   Other PT Secondary Current Status (I2641) At least 1 percent but less than 20 percent impaired, limited or restricted   Other PT Secondary Goal Status (R8309) At least 1 percent but less than 20 percent impaired, limited or restricted   Other PT Secondary Discharge Status (M0768) At least 1 percent but less than 20 percent impaired, limited or restricted     Patient will follow up at outpatient cancer rehab if needed following surgery.  If the patient requires physical therapy at that time, a specific plan will be dictated and sent to the referring physician for approval. The patient was educated today on appropriate basic range of motion exercises to begin post operatively and the importance of attending the After Breast Cancer class following surgery.  Patient was educated today on lymphedema risk reduction practices as it pertains to  recommendations  that will benefit the patient immediately following surgery.  She verbalized good understanding.  No additional physical therapy is indicated at this time.     Problem List Patient Active Problem List   Diagnosis Date Noted  . Breast cancer of lower-outer quadrant of left female breast ([place]) 12/11/2015  . Ventral incisional hernia 07/22/2014  . Pain in limb 12/28/2011  . Varicose veins of lower extremities with other complications 38/75/6433  . Leg pain 02/02/2011  . Varicose veins of right lower extremity with pain 02/02/2011    Annia Friendly, PT 12/17/2015 11:43 AM  [place], [place], [address] Phone: [phone]   Fax:  [phone]  Name: Katrina Young MRN: [id number] Date of Birth: [birth year]

## 2015-12-17 NOTE — Progress Notes (Signed)
REFERRING PROVIDER: Leanna Battles, MD [place], [place] [address]   Katrina Lose, MD  PRIMARY PROVIDER:  Donnajean Lopes, MD  PRIMARY REASON FOR VISIT:  1. Breast cancer of lower-outer quadrant of left female breast ([place])   2. Ovarian cancer, unspecified laterality ([place])   3. Family history of prostate cancer   4. Family history of breast cancer   5. Family history of colon cancer   6. Family history of melanoma      HISTORY OF PRESENT ILLNESS:   Katrina Young, a 75 y.o. female, was seen for a [facility] cancer genetics consultation at the request of Dr. Lindi Adie due to a personal and family history of cancer.  Katrina Young presents to clinic today to discuss the possibility of a hereditary predisposition to cancer, genetic testing, and to further clarify her future cancer risks, as well as potential cancer risks for family members.   In 1998, at the age of 42, Katrina Young was diagnosed with ovarian cancer. This was treated with surgery. She did not report what other treatment she had.  In 2017 at the age of 64, Katrina Young was diagnosed with cancer of the left breast.  The tumor is ER+/PR+/Her2-.  This will be treated with lumpectomy, possible radiation and tamoxifen.  The patient was referred for genetic testing to determine if additional management was needed or if surgery decisions need to be changed.    CANCER HISTORY:    Breast cancer of lower-outer quadrant of left female breast ([place])   12/11/2015 Initial Diagnosis Left breast mass at 3:00: 1.7 x 1.6 x 1.2 cm, axilla negative, grade 2 IDC with DCIS, ER/PR positive HER-2 negative Ki-67 15%, T1 cN0 stage IA clinical stage     HORMONAL RISK FACTORS:  Menarche was at age 38.  First live birth at age 56.  OCP use for approximately 6 years.  Ovaries intact: no.  Hysterectomy: yes.  Menopausal status: postmenopausal.  HRT use: 10-15 years. Colonoscopy: yes; 1 tubular adenoma. Mammogram within the last year: yes. Number of  breast biopsies: 2. Up to date with pelvic exams:  yes. Any excessive radiation exposure in the past:  no  Past Medical History  Diagnosis Date  . Weight gain   . Reflux   . Constipation   . Diminished eyesight change in eyesight    cataracts  . 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 Joint pain   . Hypertension   . Hyperlipidemia   . Varicose veins   . Leg pain   . Complication of anesthesia   . PONV (postoperative nausea and vomiting)   . Shortness of breath dyspnea     increased walking or climbing stairs  . GERD (gastroesophageal reflux disease)   . Arthritis   . Cancer ([place])     ovarian cancer (hysterectomy-12-14 years ago) no txs  . Breast cancer of lower-outer quadrant of left female breast ([place]) 12/11/2015  . Breast cancer ([place])   . Family history of prostate cancer   . Family history of breast cancer   . Family history of colon cancer   . Family history of melanoma     Past Surgical History  Procedure Laterality Date  . Evla   RIGHT SMALL SAPHENOUS VEIN 09-13-2007  . Evla  R  GSV  02-10-2011  . Shoulder surgery      bilat - rotaor cuff repair secondary to tear  . Bladder surgery    . Endovenous ablation saphenous vein w/ laser  12-30-2011    left greater saphenous vein  . Tonsillectomy    . Colonscopy     . Breast surgery      hx of removal of calcum build up -left breast   . Abdominal hysterectomy      total hx of ovarian cancer   . Cholecystectomy    . Ventral hernia repair N/A 07/23/2014    Procedure: VENTRAL INCISIONAL HERNIA REPAIR WITH MESH;  Surgeon: Armandina Gemma, MD;  Location: [facility];  Service: General;  Laterality: N/A;    Social History   Social History  . Marital Status: Married    Spouse Name: Katrina Young  . Number of Children: 2  . Years of Education: N/A   Social History Main Topics  . Smoking status: Former Smoker -- 2 years    Types: Cigarettes    Quit date: 07/26/1966  . Smokeless tobacco: Never Used  . Alcohol Use: No  . Drug Use:  No  . Sexual Activity: Not Asked   Other Topics Concern  . None   Social History Narrative     FAMILY HISTORY:  We obtained a detailed, 4-generation family history.  Significant diagnoses are listed below: Family History  Problem Relation Age of Onset  . Heart disease Mother   . Hypertension Father   . Heart disease Father   . Breast cancer Sister 54  . Leukemia Brother 11  . Melanoma Brother 25    on his chest  . Prostate cancer Brother 12  . Colon cancer Sister 64  . Melanoma Brother 76  . Diabetes Sister   . Heart disease Sister   . Prostate cancer Brother 10  . Stroke Brother   . Breast cancer Other 50  . Cancer Other     unknown - died quickly  . Breast cancer Cousin     dx 35-40 yrs, maternal first cousin  . Dementia Cousin     The patient has two daughters who are healthy and cnacer free.  She has 14 siblings.  There is a history of two brothers with prostate cancer, a sister with breast cancer, a sister with colon cancer, two brothers with melanoma and a brother who died at 53 from leukemia.  One sister who did not have cancer had a daughter with breast cancer, and a brother who did not have cancer had a daughter with an unknown cancer.  The patient's mother died at 46 from heart failure and her father died at 73 from heart failure.  The patient's parents were both born out of wedlock, so no information is known about either biological grandfathers, and all aunts/uncles were maternal half siblings. A maternal cousin on was diagnosed with breast cancer in her 30s-40s.  There is no other reported family history of cancer. Patient's maternal ancestors are of Caucasian descent, and paternal ancestors are of Caucasian descent. There is no reported Ashkenazi Jewish ancestry. There is no known consanguinity.  GENETIC COUNSELING ASSESSMENT: Katrina Young is a 75 y.o. female with a personal and family history of cancer which is somewhat suggestive of a hereditary cancer syndrome  and predisposition to cancer. We, therefore, discussed and recommended the following at today's visit.   DISCUSSION: We discussed that about 5-10% of breast cancer is hereditary, with most cases due to BRCA mutations.  Additionally, about 20% of ovarian cancer is hereditary with most cases due to BRCA mutations or Lynch syndrome.  Based on the family history of cancer we discussed BRCA, Lynch syndrome as  well as other genes that are commonly seen in our clinic with hereditary breast cancer including PALB2, ATM and CHEK2.  BAsed on the early onset melanoma, we also discussed adding BAP1, CDKN2a, CDK4 and MITF to the panel.  We reviewed the characteristics, features and inheritance patterns of hereditary cancer syndromes. We also discussed genetic testing, including the appropriate family members to test, the process of testing, insurance coverage and turn-around-time for results. We discussed the implications of a negative, positive and/or variant of uncertain significant result. We recommended Katrina Young pursue genetic testing for the custom gene panel that includes all genes from the comprehensive cancer panel and additional melanoma genes.  The Custom Cancer Panel offered by [facility] includes sequencing and/or deletion duplication testing of the following 34 genes: APC, ATM, AXIN2, BAP1, BARD1, BMPR1A, BRCA1, BRCA2, BRIP1, CDH1, CDK4, CDKN2A, CHEK2, EPCAM, FANCC, MITF, MLH1, MSH2, MSH6, MUTYH, NBN, PALB2, PMS2, POLD1, POLE, PTEN, RAD51C, RAD51D, SCG5/GREM1, SMAD4, STK11, TP53, VHL, and XRCC2.     Based on Ms. Alberico personal and family history of cancer, she meets medical criteria for genetic testing. Despite that she meets criteria, she may still have an out of pocket cost. We discussed that if her out of pocket cost for testing is over $100, the laboratory will call and confirm whether she wants to proceed with testing.  If the out of pocket cost of testing is less than $100 she will be billed by the genetic  testing laboratory.   PLAN: After considering the risks, benefits, and limitations, Katrina Young  provided informed consent to pursue genetic testing and the blood sample was sent to [facility] for analysis of the Custom gene panel.  We asked that results be rushed because of surgical decisions. Results should be available within approximately 2-3 weeks' time, at which point they will be disclosed by telephone to Katrina Young, as will any additional recommendations warranted by these results. Katrina Young will receive a summary of her genetic counseling visit and a copy of her results once available. This information will also be available in Epic. We encouraged Katrina Young to remain in contact with cancer genetics annually so that we can continuously update the family history and inform her of any changes in cancer genetics and testing that may be of benefit for her family. Katrina Young questions were answered to her satisfaction today. Our contact information was provided should additional questions or concerns arise.  Lastly, we encouraged Katrina Young to remain in contact with cancer genetics annually so that we can continuously update the family history and inform her of any changes in cancer genetics and testing that may be of benefit for this family.   Ms.  Young questions were answered to her satisfaction today. Our contact information was provided should additional questions or concerns arise. Thank you for the referral and allowing [place] to share in the care of your patient.   Katrina Young, [place], [facility] Certified Genetic Counselor [email or web address] phone: [phone]  The patient was seen for a total of 45 minutes in face-to-face genetic counseling.  This patient was discussed with Drs. Magrinat, Lindi Adie and/or Burr Medico who agrees with the above.    _______________________________________________________________________ For Office Staff:  Number of people involved in session: 6 Was an Intern/  student involved with case: no

## 2015-12-17 NOTE — Progress Notes (Signed)
Subjective:     Patient ID: Katrina Young, female   DOB: [birth year], 75 y.o.   MRN: [id number]  HPI   Review of Systems     Objective:   Physical Exam For the patient to understand and be given the tools to implement a healthy plant based diet during their cancer diagnosis.     Assessment:     Patient was seen today and found to be in good spirits and accompanied by her whole family. Pts ht 63'', 167 lbs, BMI 29.6. Pts medications: losarten, multivitamin, pravastatin, omega 3, vitamin B12. Pt states she eats anything and everything. Pt was concerned mostly with her iron level. Due to the amount of people and the pts slight hearing impairment it was difficult to get the information across to her. Appointment was cut short so Katrina Young's information was stressed if she had any questions.       Plan:     Dietitian educated the patient on implementing a plant based diet by incorporating more plant proteins, fruits, and vegetables. As a part of a healthy routine physical activity was discussed. Dietitian educated the pt on good sources of iron. The importance of legitimate, evidence based information was discussed and examples were given. A folder of evidence based information with a focus on a plant based diet and general nutrition during cancer was given to the patient.  As a part of the continuum of care the cancer dietitian's contact information was given to the patient in the event they would like to have a follow up appointment.

## 2015-12-17 NOTE — Progress Notes (Signed)
Radiation Oncology         [phone]) [phone] ________________________________  Initial Outpatient Consultation - Date: 12/17/2015   Name: Katrina Young MRN: [id number]   DOB: 09/06/1940  REFERRING PHYSICIAN: Leanna Battles, MD  DIAGNOSIS AND STAGE: Breast cancer of lower-outer quadrant of left female breast [facility])   Staging form: Breast, AJCC 7th Edition     Clinical stage from 12/17/2015: Stage IA (T1c, N0, M0) - Signed by Katrina Lose, MD on 12/17/2015   HISTORY OF PRESENT ILLNESS::Katrina Young is a 75 y.o. female who presents today after an abnormal mammogram in November 2016. She had a diagnostic mammogram and ultrasound 05/29/2015, revealing no defined mass but ultrasound showed a vague area of shadowing in the left breast, near the nipple, approximately at the 4 o'clock position. She was scheduled to have a 6 month follow up mammogram. She had a diagnostic mammogram and ultrasound 12/03/2015, revealing a spiculated mass in the upper-outer quadrant of the left breast.  The ultrasound showed a hypoechoic mass in the left breast at the 3:30 o'clock position 3 cm from the nipple, measuring 1.7 x 1.6 x 1.1 cm. Ultrasound of the left axilla revealed no evidence for adenopathy. 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 She is here today to discuss radiation treatment with Dr. Pablo Young.   She has a history of ovarian cancer. She uses Neosporin and Polysporin on her breast at a spot where the paper tape has peeled off some skin after the biopsy.   She is accompanied by her daughters and husband.   PREVIOUS RADIATION THERAPY: No  Past medical, social and family history were reviewed in the electronic chart. Review of symptoms was reviewed in the electronic chart. Medications were reviewed in the electronic chart.   PHYSICAL EXAM: There were no vitals  filed for this visit.. .  Vitals with BMI 12/17/2015  Height 5' 3"   Weight 167 lbs  BMI 16.0  Systolic 109  Diastolic 74  Pulse 58  Respirations 19    No palpable cervical, supraclavicular, or axillary adenopathy. Bruising with a palpable mass near the nipple of the left breast with a palpable mass measuring about a cm just below the skin.  Gynecologic History  Age at first menstrual period? 13  Are you still having periods? No Approximate date of last period? 1972  If you no longer have periods: Have you used hormone replacement? Yes  If YES, for how long? 10-15 years When did you stop? 1998 Obstetric History:  How many children have you carried to term? 3 Your age at first live birth? 4  Pregnant now or trying to get pregnant? No  Have you used birth control pills or hormone shots for contraception? Yes  If so, for how long (or approximate dates)? 6 years Health Maintenance:  Have you ever had a colonoscopy? Yes If yes, date? 3 years ago  Have you ever had a bone density? Yes If yes, date? Last year  Date of your last PAP smear? Last year Date of your FIRST mammogram? 82   IMPRESSION: Katrina Young is a 75 yo female with breast cancer of the lower-outer quadrant of the left female breast. She is a good candidate for a lumpectomy and sentinel lymph node biopsy, oncotype testing, external radiation, and aromatase inhibitor, and genetics.   PLAN: I spoke  to the patient today regarding her diagnosis and options for treatment. We discussed the equivalence in terms of survival and local failure between mastectomy and breast conservation. We discussed 4-6 weeks of treatment as an outpatient. We discussed the possible side effects including but not limited to skin redness, fatigue, permanent skin darkening, and breast swelling. She would like to proceed with both radiation and antiestrogen therapy. I will follow up with her after surgery.  She has an appointment with genetics today at 12  pm.  I spent 40 minutes  face to face with the patient and more than 50% of that time was spent in counseling and/or coordination of care.   ------------------------------------------------  Katrina Silversmith, MD    This document serves as a record of services personally performed by Katrina Silversmith, MD. It was created on her behalf by  Katrina Young, a trained medical scribe. The creation of this record is based on the scribe's personal observations and the provider's statements to them. This document has been checked and approved by the attending provider.

## 2015-12-17 NOTE — Assessment & Plan Note (Signed)
Screening detected right breast distortion retroareolar 1.3 x 1.3 x 0.8 cm, skin and nipple suspicious: Biopsy 12/11/2015 grade 1 IDC ER 95%, PR 90%, HER-2 negative ratio 1.28, Ki-67 15% T1 cN0 stage IA clinical stage  Pathology and radiology counseling:Discussed with the patient, the details of pathology including the type of breast cancer,the clinical staging, the significance of ER, PR and HER-2/neu receptors and the implications for treatment. After reviewing the pathology in detail, we proceeded to discuss the different treatment options between surgery, radiation, chemotherapy, antiestrogen therapies.  Recommendations: 1. Breast conserving surgery followed by 2. Oncotype DX testing to determine if chemotherapy would be of any benefit followed by 3. Adjuvant radiation therapy followed by 4. Adjuvant antiestrogen therapy  Oncotype counseling: I discussed Oncotype DX test. I explained to the patient that this is a 21 gene panel to evaluate patient tumors DNA to calculate recurrence score. This would help determine whether patient has high risk or intermediate risk or low risk breast cancer. She understands that if her tumor was found to be high risk, she would benefit from systemic chemotherapy. If low risk, no need of chemotherapy. If she was found to be intermediate risk, we would need to evaluate the score as well as other risk factors and determine if an abbreviated chemotherapy may be of benefit.  Return to clinic after surgery to discuss final pathology report and then determine if Oncotype DX testing will need to be sent.   

## 2015-12-19 ENCOUNTER — Other Ambulatory Visit: Payer: Self-pay | Admitting: Surgery

## 2015-12-19 DIAGNOSIS — C50122 Malignant neoplasm of central portion of left male breast: Secondary | ICD-10-CM

## 2015-12-24 ENCOUNTER — Telehealth: Payer: Self-pay | Admitting: *Deleted

## 2015-12-24 ENCOUNTER — Encounter: Payer: Self-pay | Admitting: *Deleted

## 2015-12-24 ENCOUNTER — Telehealth: Payer: Self-pay | Admitting: Hematology and Oncology

## 2015-12-24 NOTE — Telephone Encounter (Signed)
Spoke with pt husband to confirm 7/7 appt at 11:15 am

## 2015-12-24 NOTE — Telephone Encounter (Signed)
Called to discuss [place] from 12/17/15. Requested a return call after 4pm.

## 2015-12-25 ENCOUNTER — Telehealth: Payer: Self-pay | Admitting: *Deleted

## 2015-12-25 HISTORY — PX: BREAST LUMPECTOMY: SHX2

## 2015-12-25 NOTE — Telephone Encounter (Signed)
Left msg with spouse for return call regarding needs or questions from [facility] on 12/17/15. Contact information provided.

## 2015-12-25 NOTE — Telephone Encounter (Signed)
  Oncology Nurse Navigator Documentation    Navigator Encounter Type: Telephone (12/25/15 1400) Telephone: Lahoma Crocker Call;Clinic/MDC Follow-up (12/25/15 1400)             Barriers/Navigation Needs: No barriers at this time;No Questions;No Needs (12/25/15 1400)   Interventions: None required (12/25/15 1400)                      Time Spent with Patient: 15 (12/25/15 1400)

## 2016-01-05 ENCOUNTER — Encounter: Payer: Self-pay | Admitting: Genetic Counselor

## 2016-01-05 ENCOUNTER — Telehealth: Payer: Self-pay | Admitting: Genetic Counselor

## 2016-01-05 DIAGNOSIS — Z1379 Encounter for other screening for genetic and chromosomal anomalies: Secondary | ICD-10-CM | POA: Insufficient documentation

## 2016-01-05 NOTE — Telephone Encounter (Signed)
Revealed negative genetic testing on the custom genetic panel.  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  Most VUS are reclassified as harmless.

## 2016-01-06 ENCOUNTER — Ambulatory Visit: Payer: Self-pay | Admitting: Genetic Counselor

## 2016-01-06 DIAGNOSIS — C569 Malignant neoplasm of unspecified ovary: Secondary | ICD-10-CM

## 2016-01-06 DIAGNOSIS — Z808 Family history of malignant neoplasm of other organs or systems: Secondary | ICD-10-CM

## 2016-01-06 DIAGNOSIS — Z1379 Encounter for other screening for genetic and chromosomal anomalies: Secondary | ICD-10-CM

## 2016-01-06 DIAGNOSIS — C50512 Malignant neoplasm of lower-outer quadrant of left female breast: Secondary | ICD-10-CM

## 2016-01-06 DIAGNOSIS — Z8042 Family history of malignant neoplasm of prostate: Secondary | ICD-10-CM

## 2016-01-06 DIAGNOSIS — Z800 Family history of malignant neoplasm of digestive organs: Secondary | ICD-10-CM

## 2016-01-06 DIAGNOSIS — Z803 Family history of malignant neoplasm of breast: Secondary | ICD-10-CM

## 2016-01-06 NOTE — Progress Notes (Signed)
HPI: Katrina Young was previously seen in the [place] clinic due to a personal and family history of cancer and concerns regarding a hereditary predisposition to cancer. Please refer to our prior cancer genetics clinic note for more information regarding Katrina Young's medical, social and family histories, and our assessment and recommendations, at the time. Katrina Young recent genetic test results were disclosed to her, as were recommendations warranted by these results. These results and recommendations are discussed in more detail below.  FAMILY HISTORY:  We obtained a detailed, 4-generation family history.  Significant diagnoses are listed below: Family History  Problem Relation Age of Onset  . Heart disease Mother   . Hypertension Father   . Heart disease Father   . Breast cancer Sister 41  . Leukemia Brother 11  . Melanoma Brother 25    on his chest  . Prostate cancer Brother 22  . Colon cancer Sister 64  . Melanoma Brother 31  . Diabetes Sister   . Heart disease Sister   . Prostate cancer Brother 75  . Stroke Brother   . Breast cancer Other 50  . Cancer Other     unknown - died quickly  . Breast cancer Cousin     dx 35-40 yrs, maternal first cousin  . Dementia Cousin     The patient has two daughters who are healthy and cnacer free. She has 14 siblings. There is a history of two brothers with prostate cancer, a sister with breast cancer, a sister with colon cancer, two brothers with melanoma and a brother who died at 49 from leukemia. One sister who did not have cancer had a daughter with breast cancer, and a brother who did not have cancer had a daughter with an unknown cancer. The patient's mother died at 76 from heart failure and her father died at 52 from heart failure. The patient's parents were both born out of wedlock, so no information is known about either biological grandfathers, and all aunts/uncles were maternal half siblings. A maternal cousin on was  diagnosed with breast cancer in her 30s-40s. There is no other reported family history of cancer. Patient's maternal ancestors are of Caucasian descent, and paternal ancestors are of Caucasian descent. There is no reported Ashkenazi Jewish ancestry. There is no known consanguinity.  GENETIC TEST RESULTS: At the time of Katrina Young's visit, we recommended she pursue genetic testing of a Custom gene panel. The Custom Cancer Panel offered by [facility] includes sequencing and/or deletion duplication testing of the following 34 genes: APC, ATM, AXIN2, BAP1, BARD1, BMPR1A, BRCA1, BRCA2, BRIP1, CDH1, CDK4, CDKN2A, CHEK2, EPCAM, FANCC, MITF, MLH1, MSH2, MSH6, MUTYH, NBN, PALB2, PMS2, POLD1, POLE, PTEN, RAD51C, RAD51D, SCG5/GREM1, SMAD4, STK11, TP53, VHL, and XRCC2.     The report date is January 02, 2016.  Genetic testing was normal, and did not reveal a deleterious mutation in these genes. The test report has been scanned into EPIC and is located under the Molecular Pathology section of the Results Review tab.   We discussed with Katrina Young that since the current genetic testing is not perfect, it is possible there may be a gene mutation in one of these genes that current testing cannot detect, but that chance is small. We also discussed, that it is possible that another gene that has not yet been discovered, or that we have not yet tested, is responsible for the cancer diagnoses in the family, and it is, therefore, important to remain in touch with  cancer genetics in the future so that we can continue to offer Katrina Young the most up to date genetic testing.   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  At this time, it is unknown if this variant is associated with increased cancer risk or if this is a normal finding, but most variants such as this get reclassified to being inconsequential. It should not be used to make medical management decisions. With time, we suspect the  lab will determine the significance of this variant, if any. If we do learn more about it, we will try to contact Katrina Young to discuss it further. However, it is important to stay in touch with [place] periodically and keep the address and phone number up to date.  CANCER SCREENING RECOMMENDATIONS: This result is reassuring and indicates that Katrina Young likely does not have an increased risk for a future cancer due to a mutation in one of these genes. This normal test also suggests that Katrina Young cancer was most likely not due to an inherited predisposition associated with one of these genes.  Most cancers happen by chance and this negative test suggests that her cancer falls into this category.  We, therefore, recommended she continue to follow the cancer management and screening guidelines provided by her oncology and primary healthcare provider.   RECOMMENDATIONS FOR FAMILY MEMBERS: Women in this family might be at some increased risk of developing cancer, over the general population risk, simply due to the family history of cancer. We recommended women in this family have a yearly mammogram beginning at age 51, or 58 years younger than the earliest onset of cancer, an an annual clinical breast exam, and perform monthly breast self-exams. Women in this family should also have a gynecological exam as recommended by their primary provider. All family members should have a colonoscopy by age 75.  FOLLOW-UP: Lastly, we discussed with Katrina Young that cancer genetics is a rapidly advancing field and it is possible that new genetic tests will be appropriate for her and/or her family members in the future. We encouraged her to remain in contact with cancer genetics on an annual basis so we can update her personal and family histories and let her know of advances in cancer genetics that may benefit this family.   Our contact number was provided. Katrina Young questions were answered to her satisfaction, and she knows she is  welcome to call us at anytime with additional questions or concerns.   Roma Kayser, MS, [facility] Certified Genetic Counselor [email or web address]

## 2016-01-10 IMAGING — CR DG CHEST 2V
2 series · 2 of 2 positions shown · non-contrast
Comparison: None.

CLINICAL DATA: Preoperative respiratory exam for ventral hernia
repair. History of hypertension.

EXAM:
CHEST  2 VIEW

[w chest pa]
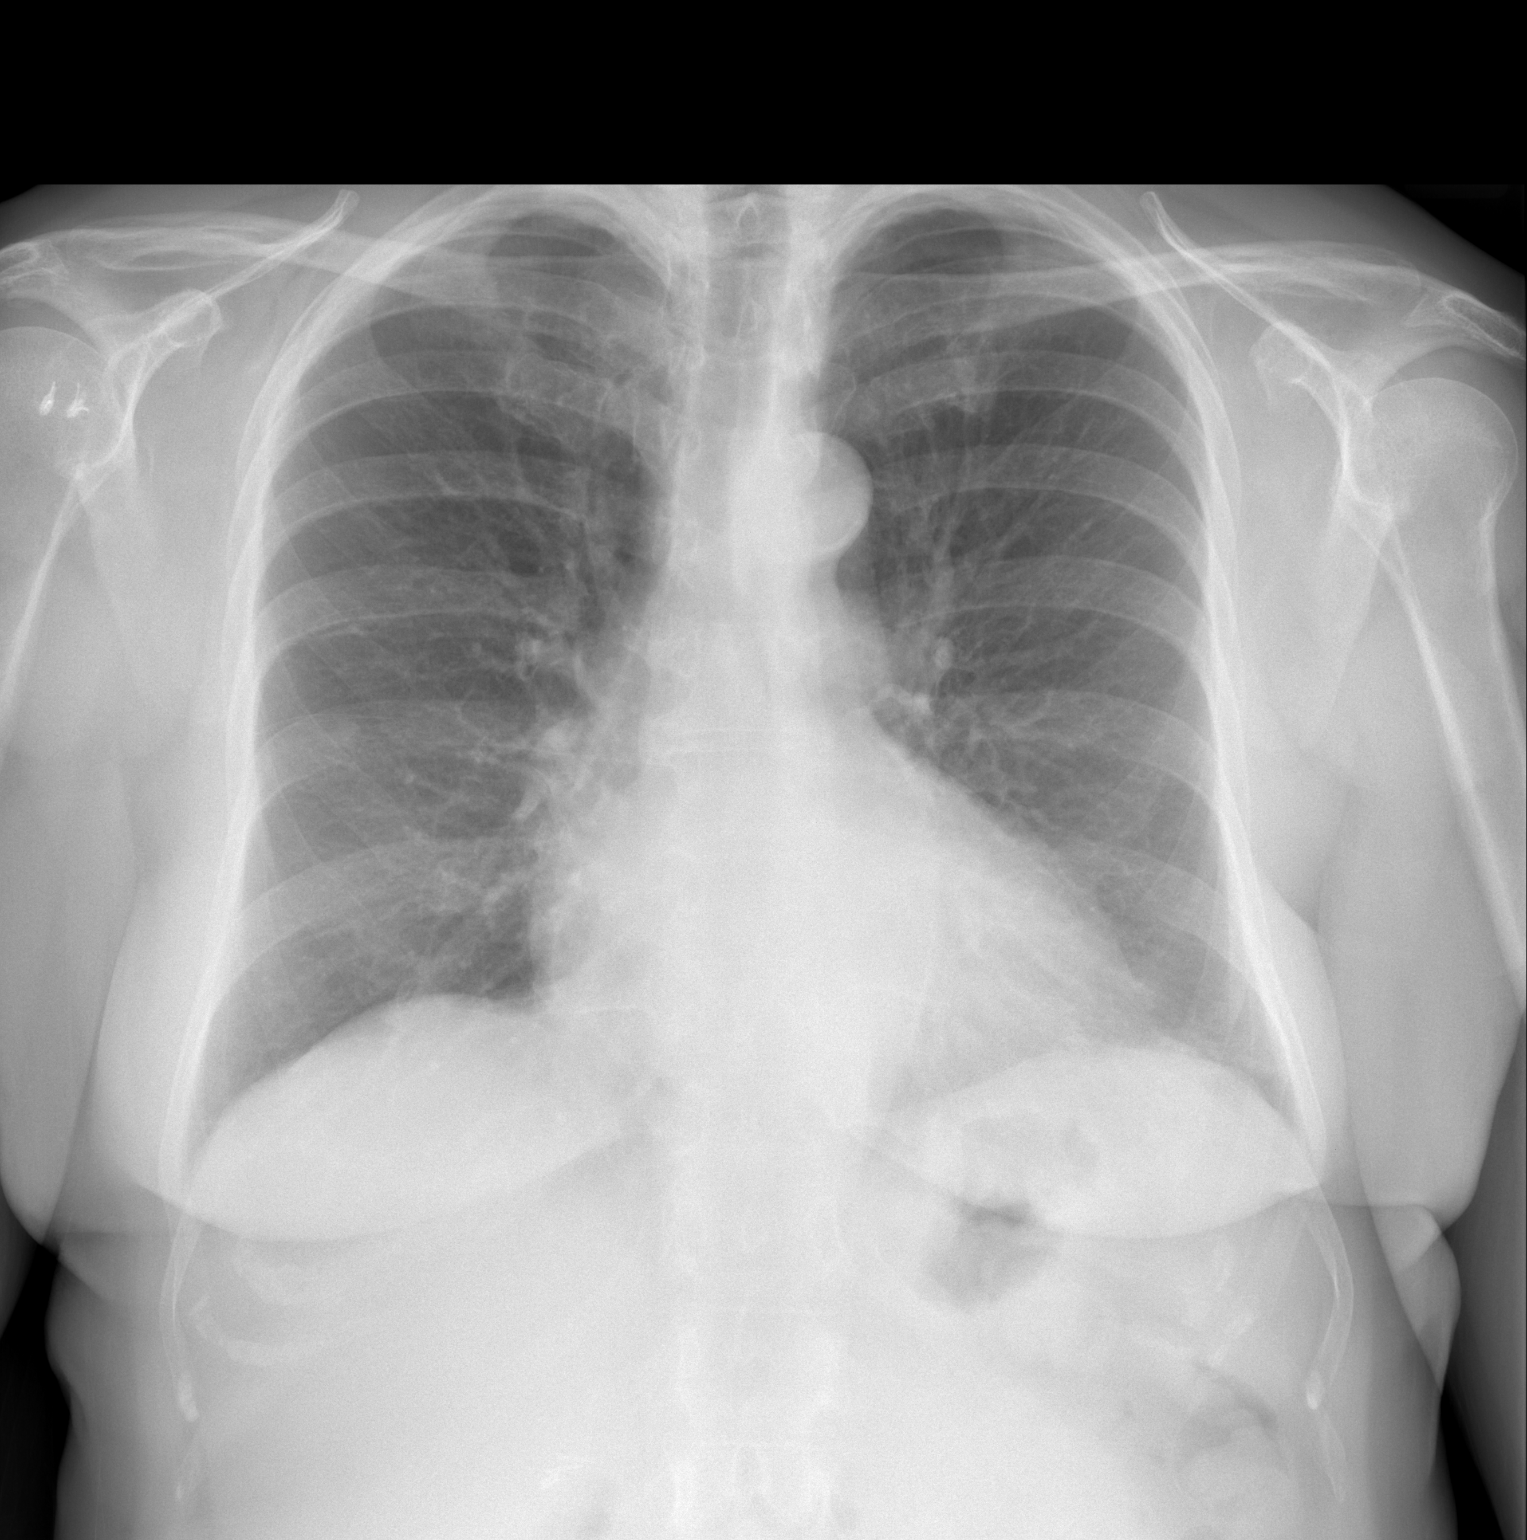

[w chest lat]
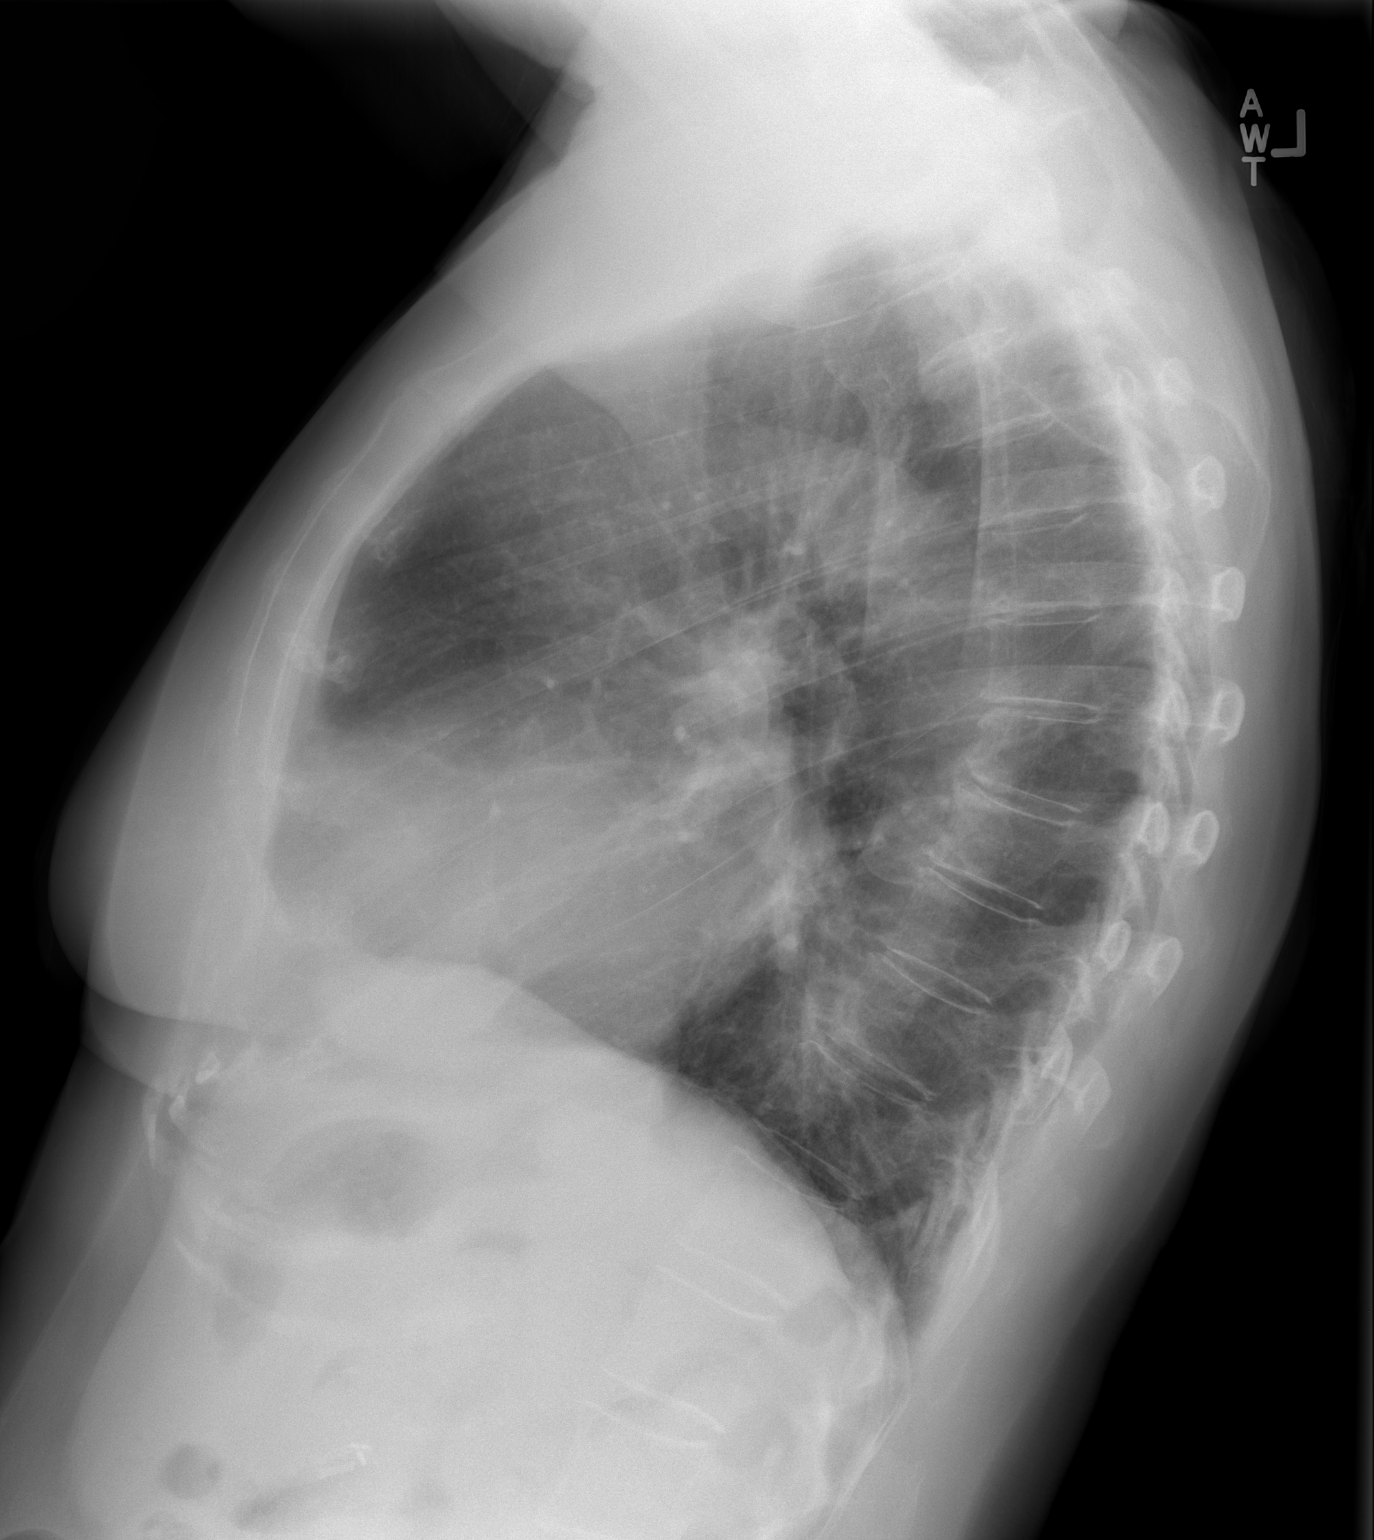

[2 of 2 positions shown; findings below may reference images not displayed]

FINDINGS: Heart size is at the upper limits of normal. There is calcification
and slight unfolding of the thoracic aorta. There is slightly
asymmetric density associated with the left first rib end. Question
9 mm nodule of the right mid lung. Are there are old films for
comparison at any other location? If not, chest CT would be
suggested to evaluate this further. This could be nonemergent. No
chest finding seen that would be expected to affect the surgery. No
effusions. No bony finding.
IMPRESSION: No sign of pneumonia, collapse or effusion.

Question 9 mm pulmonary nodule in the right midlung. See above
discussion.

## 2016-01-16 ENCOUNTER — Encounter (HOSPITAL_BASED_OUTPATIENT_CLINIC_OR_DEPARTMENT_OTHER): Payer: Self-pay | Admitting: *Deleted

## 2016-01-16 NOTE — Pre-Procedure Instructions (Signed)
Pt states she had dental bridge broken with her last surgery/anesthesia. She is very concerned that great care be taken with this anesthesia.

## 2016-01-19 ENCOUNTER — Encounter (HOSPITAL_BASED_OUTPATIENT_CLINIC_OR_DEPARTMENT_OTHER)
Admission: RE | Admit: 2016-01-19 | Discharge: 2016-01-19 | Disposition: A | Discharge status: Home / Self Care | Payer: Medicare Other | Source: Ambulatory Visit | Attending: Surgery | Admitting: Surgery

## 2016-01-19 ENCOUNTER — Other Ambulatory Visit: Payer: Self-pay

## 2016-01-19 DIAGNOSIS — K219 Gastro-esophageal reflux disease without esophagitis: Secondary | ICD-10-CM | POA: Diagnosis not present

## 2016-01-19 DIAGNOSIS — M1990 Unspecified osteoarthritis, unspecified site: Secondary | ICD-10-CM | POA: Diagnosis not present

## 2016-01-19 DIAGNOSIS — C569 Malignant neoplasm of unspecified ovary: Secondary | ICD-10-CM | POA: Diagnosis not present

## 2016-01-19 DIAGNOSIS — Z87891 Personal history of nicotine dependence: Secondary | ICD-10-CM | POA: Diagnosis not present

## 2016-01-19 DIAGNOSIS — I10 Essential (primary) hypertension: Secondary | ICD-10-CM | POA: Diagnosis not present

## 2016-01-19 DIAGNOSIS — C50812 Malignant neoplasm of overlapping sites of left female breast: Secondary | ICD-10-CM | POA: Diagnosis not present

## 2016-01-19 LAB — CBC WITH DIFFERENTIAL/PLATELET
Basophils Absolute: 0 10*3/uL (ref 0.0–0.1)
Basophils Relative: 1 %
Eosinophils Absolute: 0.1 10*3/uL (ref 0.0–0.7)
Eosinophils Relative: 2 %
HCT: 38.4 % (ref 36.0–46.0)
Hemoglobin: 12.7 g/dL (ref 12.0–15.0)
Lymphocytes Relative: 31 %
Lymphs Abs: 1.4 10*3/uL (ref 0.7–4.0)
MCH: 28.5 pg (ref 26.0–34.0)
MCHC: 33.1 g/dL (ref 30.0–36.0)
MCV: 86.3 fL (ref 78.0–100.0)
Monocytes Absolute: 0.4 10*3/uL (ref 0.1–1.0)
Monocytes Relative: 8 %
Neutro Abs: 2.8 10*3/uL (ref 1.7–7.7)
Neutrophils Relative %: 58 %
Platelets: 191 10*3/uL (ref 150–400)
RBC: 4.45 MIL/uL (ref 3.87–5.11)
RDW: 12.1 % (ref 11.5–15.5)
WBC: 4.7 10*3/uL (ref 4.0–10.5)

## 2016-01-19 LAB — COMPREHENSIVE METABOLIC PANEL
ALT: 25 U/L (ref 14–54)
AST: 28 U/L (ref 15–41)
Albumin: 4.1 g/dL (ref 3.5–5.0)
Alkaline Phosphatase: 93 U/L (ref 38–126)
Anion gap: 7 (ref 5–15)
BUN: 13 mg/dL (ref 6–20)
CO2: 29 mmol/L (ref 22–32)
Calcium: 9.9 mg/dL (ref 8.9–10.3)
Chloride: 105 mmol/L (ref 101–111)
Creatinine, Ser: 0.64 mg/dL (ref 0.44–1.00)
GFR calc Af Amer: >60 mL/min (ref >60)
GFR calc non Af Amer: >60 mL/min (ref >60)
Glucose, Bld: 109 mg/dL — ABNORMAL HIGH (ref 65–99)
Potassium: 4.5 mmol/L (ref 3.5–5.1)
Sodium: 141 mmol/L (ref 135–145)
Total Bilirubin: 0.6 mg/dL (ref 0.3–1.2)
Total Protein: 6.5 g/dL (ref 6.5–8.1)

## 2016-01-19 NOTE — Progress Notes (Signed)
Pt given Boost drink and instructed to drink by 0615 am day of surgery. Pt verbalized understanding. Dr. Al Corpus reviewed EKG and previous EKG from 2015 - ok for surgery.

## 2016-01-21 ENCOUNTER — Ambulatory Visit
Admission: RE | Admit: 2016-01-21 | Discharge: 2016-01-21 | Disposition: A | Discharge status: Home / Self Care | Payer: Medicare Other | Source: Ambulatory Visit | Attending: Surgery | Admitting: Surgery

## 2016-01-21 DIAGNOSIS — C50912 Malignant neoplasm of unspecified site of left female breast: Secondary | ICD-10-CM | POA: Diagnosis not present

## 2016-01-21 DIAGNOSIS — C50122 Malignant neoplasm of central portion of left male breast: Secondary | ICD-10-CM

## 2016-01-22 ENCOUNTER — Ambulatory Visit (HOSPITAL_BASED_OUTPATIENT_CLINIC_OR_DEPARTMENT_OTHER): Payer: Medicare Other | Admitting: Anesthesiology

## 2016-01-22 ENCOUNTER — Ambulatory Visit
Admission: RE | Admit: 2016-01-22 | Discharge: 2016-01-22 | Disposition: A | Discharge status: Home / Self Care | Payer: Medicare Other | Source: Ambulatory Visit | Attending: Surgery | Admitting: Surgery

## 2016-01-22 ENCOUNTER — Ambulatory Visit (HOSPITAL_BASED_OUTPATIENT_CLINIC_OR_DEPARTMENT_OTHER)
Admission: RE | Admit: 2016-01-22 | Discharge: 2016-01-22 | Disposition: A | Discharge status: Home / Self Care | Payer: Medicare Other | Source: Ambulatory Visit | Attending: Surgery | Admitting: Surgery

## 2016-01-22 ENCOUNTER — Encounter (HOSPITAL_BASED_OUTPATIENT_CLINIC_OR_DEPARTMENT_OTHER)
Admission: RE | Disposition: A | Discharge status: Home / Self Care | Payer: Self-pay | Source: Ambulatory Visit | Attending: Surgery

## 2016-01-22 ENCOUNTER — Encounter (HOSPITAL_BASED_OUTPATIENT_CLINIC_OR_DEPARTMENT_OTHER): Payer: Self-pay

## 2016-01-22 ENCOUNTER — Ambulatory Visit (HOSPITAL_COMMUNITY)
Admission: RE | Admit: 2016-01-22 | Discharge: 2016-01-22 | Disposition: A | Discharge status: Home / Self Care | Payer: Medicare Other | Source: Ambulatory Visit | Attending: Surgery | Admitting: Surgery

## 2016-01-22 DIAGNOSIS — M1990 Unspecified osteoarthritis, unspecified site: Secondary | ICD-10-CM | POA: Insufficient documentation

## 2016-01-22 DIAGNOSIS — N6012 Diffuse cystic mastopathy of left breast: Secondary | ICD-10-CM | POA: Diagnosis not present

## 2016-01-22 DIAGNOSIS — C50512 Malignant neoplasm of lower-outer quadrant of left female breast: Secondary | ICD-10-CM

## 2016-01-22 DIAGNOSIS — G8918 Other acute postprocedural pain: Secondary | ICD-10-CM | POA: Diagnosis not present

## 2016-01-22 DIAGNOSIS — C50122 Malignant neoplasm of central portion of left male breast: Secondary | ICD-10-CM

## 2016-01-22 DIAGNOSIS — R079 Chest pain, unspecified: Secondary | ICD-10-CM | POA: Diagnosis not present

## 2016-01-22 DIAGNOSIS — K219 Gastro-esophageal reflux disease without esophagitis: Secondary | ICD-10-CM | POA: Insufficient documentation

## 2016-01-22 DIAGNOSIS — I10 Essential (primary) hypertension: Secondary | ICD-10-CM | POA: Diagnosis not present

## 2016-01-22 DIAGNOSIS — C569 Malignant neoplasm of unspecified ovary: Secondary | ICD-10-CM | POA: Insufficient documentation

## 2016-01-22 DIAGNOSIS — Z87891 Personal history of nicotine dependence: Secondary | ICD-10-CM | POA: Insufficient documentation

## 2016-01-22 DIAGNOSIS — C50812 Malignant neoplasm of overlapping sites of left female breast: Secondary | ICD-10-CM | POA: Diagnosis not present

## 2016-01-22 DIAGNOSIS — C50912 Malignant neoplasm of unspecified site of left female breast: Secondary | ICD-10-CM | POA: Diagnosis not present

## 2016-01-22 HISTORY — PX: RADIOACTIVE SEED GUIDED PARTIAL MASTECTOMY WITH AXILLARY SENTINEL LYMPH NODE BIOPSY: SHX6520

## 2016-01-22 SURGERY — RADIOACTIVE SEED GUIDED PARTIAL MASTECTOMY WITH AXILLARY SENTINEL LYMPH NODE BIOPSY
Anesthesia: General | Site: Breast | Laterality: Left

## 2016-01-22 MED ORDER — MIDAZOLAM HCL 2 MG/2ML IJ SOLN
INTRAMUSCULAR | Status: AC
  Filled 2016-01-22: qty 2

## 2016-01-22 MED ORDER — HYDROCODONE-ACETAMINOPHEN 5-325 MG PO TABS: 1.0000 | ORAL_TABLET | Freq: Four times a day (QID) | ORAL | Status: DC | PRN

## 2016-01-22 MED ORDER — CHLORHEXIDINE GLUCONATE 4 % EX LIQD: 1.0000 "application " | Freq: Once | CUTANEOUS | Status: DC

## 2016-01-22 MED ORDER — PROPOFOL 10 MG/ML IV BOLUS
INTRAVENOUS | Status: DC | PRN
  Administered 2016-01-22: 200 mg via INTRAVENOUS

## 2016-01-22 MED ORDER — CEFAZOLIN SODIUM-DEXTROSE 2-4 GM/100ML-% IV SOLN
INTRAVENOUS | Status: AC
  Filled 2016-01-22: qty 100

## 2016-01-22 MED ORDER — FENTANYL CITRATE (PF) 100 MCG/2ML IJ SOLN: 25.0000 ug | INTRAMUSCULAR | Status: DC | PRN

## 2016-01-22 MED ORDER — DEXAMETHASONE SODIUM PHOSPHATE 4 MG/ML IJ SOLN
INTRAMUSCULAR | Status: DC | PRN
  Administered 2016-01-22: 8 mg via INTRAVENOUS

## 2016-01-22 MED ORDER — TECHNETIUM TC 99M SULFUR COLLOID FILTERED
1.0000 | Freq: Once | INTRAVENOUS | Status: AC | PRN
  Administered 2016-01-22: 1 via INTRADERMAL

## 2016-01-22 MED ORDER — PROMETHAZINE HCL 12.5 MG PO TABS: 12.5000 mg | ORAL_TABLET | Freq: Four times a day (QID) | ORAL | Status: DC | PRN

## 2016-01-22 MED ORDER — MIDAZOLAM HCL 2 MG/2ML IJ SOLN
1.0000 mg | INTRAMUSCULAR | Status: DC | PRN
  Administered 2016-01-22: 0.5 mg via INTRAVENOUS
  Administered 2016-01-22: 1 mg via INTRAVENOUS

## 2016-01-22 MED ORDER — DEXTROSE 5 % IV SOLN
3.0000 g | INTRAVENOUS | Status: AC
  Administered 2016-01-22: 2 g via INTRAVENOUS

## 2016-01-22 MED ORDER — LACTATED RINGERS IV SOLN
INTRAVENOUS | Status: DC
Start: 2016-01-22 — End: 2016-01-22
  Administered 2016-01-22: 09:00:00 via INTRAVENOUS

## 2016-01-22 MED ORDER — LIDOCAINE HCL (CARDIAC) 20 MG/ML IV SOLN
INTRAVENOUS | Status: DC | PRN
  Administered 2016-01-22: 20 mg via INTRAVENOUS

## 2016-01-22 MED ORDER — FENTANYL CITRATE (PF) 100 MCG/2ML IJ SOLN
INTRAMUSCULAR | Status: AC
  Filled 2016-01-22: qty 2

## 2016-01-22 MED ORDER — BUPIVACAINE-EPINEPHRINE (PF) 0.5% -1:200000 IJ SOLN
INTRAMUSCULAR | Status: DC | PRN
  Administered 2016-01-22: 30 mL

## 2016-01-22 MED ORDER — BUPIVACAINE-EPINEPHRINE (PF) 0.25% -1:200000 IJ SOLN
INTRAMUSCULAR | Status: DC | PRN
Start: 2016-01-22 — End: 2016-01-22
  Administered 2016-01-22: 6 mL

## 2016-01-22 MED ORDER — SODIUM CHLORIDE 0.9 % IJ SOLN
INTRAVENOUS | Status: DC | PRN
  Administered 2016-01-22: 4 mL via INTRAMUSCULAR

## 2016-01-22 MED ORDER — ONDANSETRON HCL 4 MG/2ML IJ SOLN
INTRAMUSCULAR | Status: AC
  Filled 2016-01-22: qty 2

## 2016-01-22 MED ORDER — FENTANYL CITRATE (PF) 100 MCG/2ML IJ SOLN
50.0000 ug | INTRAMUSCULAR | Status: AC | PRN
  Administered 2016-01-22: 25 ug via INTRAVENOUS
  Administered 2016-01-22 (×2): 50 ug via INTRAVENOUS

## 2016-01-22 MED ORDER — GLYCOPYRROLATE 0.2 MG/ML IJ SOLN: 0.2000 mg | Freq: Once | INTRAMUSCULAR | Status: DC | PRN

## 2016-01-22 SURGICAL SUPPLY — 49 items
APPLIER CLIP 9.375 MED OPEN (MISCELLANEOUS) ×2
BINDER BREAST LRG (GAUZE/BANDAGES/DRESSINGS) ×2 IMPLANT
BINDER BREAST MEDIUM (GAUZE/BANDAGES/DRESSINGS) IMPLANT
BINDER BREAST XLRG (GAUZE/BANDAGES/DRESSINGS) IMPLANT
BINDER BREAST XXLRG (GAUZE/BANDAGES/DRESSINGS) IMPLANT
BLADE SURG 15 STRL LF DISP TIS (BLADE) ×1 IMPLANT
BLADE SURG 15 STRL SS (BLADE) ×1
CANISTER SUC SOCK COL 7IN (MISCELLANEOUS) IMPLANT
CANISTER SUCT 1200ML W/VALVE (MISCELLANEOUS) ×2 IMPLANT
CHLORAPREP W/TINT 26ML (MISCELLANEOUS) ×2 IMPLANT
CLIP APPLIE 9.375 MED OPEN (MISCELLANEOUS) ×1 IMPLANT
COVER BACK TABLE 60X90IN (DRAPES) ×2 IMPLANT
COVER MAYO STAND STRL (DRAPES) ×2 IMPLANT
COVER PROBE W GEL 5X96 (DRAPES) ×2 IMPLANT
DECANTER SPIKE VIAL GLASS SM (MISCELLANEOUS) IMPLANT
DEVICE DUBIN W/COMP PLATE 8390 (MISCELLANEOUS) ×2 IMPLANT
DRAPE LAPAROSCOPIC ABDOMINAL (DRAPES) ×2 IMPLANT
DRAPE UTILITY XL STRL (DRAPES) ×2 IMPLANT
ELECT COATED BLADE 2.86 ST (ELECTRODE) ×2 IMPLANT
ELECT REM PT RETURN 9FT ADLT (ELECTROSURGICAL) ×2
ELECTRODE REM PT RTRN 9FT ADLT (ELECTROSURGICAL) ×1 IMPLANT
GLOVE BIO SURGEON STRL SZ 6.5 (GLOVE) ×2 IMPLANT
GLOVE BIO SURGEON STRL SZ7.5 (GLOVE) ×2 IMPLANT
GLOVE BIOGEL PI IND STRL 6.5 (GLOVE) ×1 IMPLANT
GLOVE BIOGEL PI IND STRL 8 (GLOVE) ×1 IMPLANT
GLOVE BIOGEL PI INDICATOR 6.5 (GLOVE) ×1
GLOVE BIOGEL PI INDICATOR 8 (GLOVE) ×1
GLOVE ECLIPSE 8.0 STRL XLNG CF (GLOVE) ×2 IMPLANT
GOWN STRL REUS W/ TWL LRG LVL3 (GOWN DISPOSABLE) ×2 IMPLANT
GOWN STRL REUS W/TWL LRG LVL3 (GOWN DISPOSABLE) ×2
HEMOSTAT SNOW SURGICEL 2X4 (HEMOSTASIS) IMPLANT
KIT MARKER MARGIN INK (KITS) ×2 IMPLANT
LIQUID BAND (GAUZE/BANDAGES/DRESSINGS) ×2 IMPLANT
NDL SAFETY ECLIPSE 18X1.5 (NEEDLE) ×1 IMPLANT
NEEDLE HYPO 18GX1.5 SHARP (NEEDLE) ×1
NEEDLE HYPO 25X1 1.5 SAFETY (NEEDLE) ×4 IMPLANT
NS IRRIG 1000ML POUR BTL (IV SOLUTION) ×2 IMPLANT
PACK BASIN DAY SURGERY FS (CUSTOM PROCEDURE TRAY) ×2 IMPLANT
PENCIL BUTTON HOLSTER BLD 10FT (ELECTRODE) ×2 IMPLANT
SLEEVE SCD COMPRESS KNEE MED (MISCELLANEOUS) ×2 IMPLANT
SPONGE LAP 4X18 X RAY DECT (DISPOSABLE) ×2 IMPLANT
SUT MNCRL AB 4-0 PS2 18 (SUTURE) ×2 IMPLANT
SUT VICRYL 3-0 CR8 SH (SUTURE) ×2 IMPLANT
SYR 5ML LUER SLIP (SYRINGE) ×2 IMPLANT
SYR CONTROL 10ML LL (SYRINGE) ×2 IMPLANT
TOWEL OR 17X24 6PK STRL BLUE (TOWEL DISPOSABLE) ×2 IMPLANT
TOWEL OR NON WOVEN STRL DISP B (DISPOSABLE) ×2 IMPLANT
TUBE CONNECTING 20X1/4 (TUBING) ×2 IMPLANT
YANKAUER SUCT BULB TIP NO VENT (SUCTIONS) ×2 IMPLANT

## 2016-01-22 NOTE — Op Note (Signed)
Preoperative diagnosis: Stage I left breast cancer lower outer quadrants  Postoperative diagnosis: Same  Procedure: Left breast seed localized partial mastectomy with left axillary sentinel lymph node mapping with injection of methylene blue dye  Surgeon: Thomas [name] M.D.  Anesthesia: LMA with 0.25% Sensorcaine local and pectoral block per anesthesia  Specimens: Left breast mass with clip and seed in specimen verified by x-ray and 3 left axillary sentinel nodes  Drains: None  EBL: 30 mL  Indications for procedure: The patient presents for breast conserving surgery secondary to a stage I left breast cancer. She was seen in the multidisciplinary breast clinic and opted for breast conservation.The procedure has been discussed with the patient. Alternatives to surgery have been discussed with the patient.  Risks of surgery include bleeding,  Infection,  Seroma formation, death,  and the need for further surgery.   The patient understands and wishes to proceed.Sentinel lymph node mapping and dissection has been discussed with the patient.  Risk of bleeding,  Infection,  Seroma formation, arm swelling  Additional procedures,,  Shoulder weakness ,  Shoulder stiffness,  Nerve and blood vessel injury and reaction to the mapping dyes have been discussed.  Alternatives to surgery have been discussed with the patient.  The patient agrees to proceed.   Description of procedure: The patient was met in the holding area. A pectoral block was placed by anesthesia. Neoprobe was used with Benefiber seed in her left breast. She underwent injection of the left breast with radioactive tracer for the sentinel lymph node mapping. All questions were answered. The procedure and complications were reviewed with the patient and family at the bedside. She agreed to proceed.   the patient was taken back to the operating room and placed supine. After induction of LMA anesthesia, the left breast was prepped and draped in a  sterile fashion. Timeout was done. 4 mL of methylene blue dye were injected in a subareolar position of massaged. the neoprobe was used and the hotspot was identified in the left breast in the lower outer quadrant. Curvilinear incision was made along the lateral border of the nipple complex. Dissection was carried around all tissue surrounding the clip. The clip seton tissue was removed and radiograph revealed the specimen to be intact with both the seed and clip present area additional margins were taken corresponding to the areas that were the closest to the seating clinic. These are oriented as well as the specimen sent to pathology. The cavity was made hemostatic and closed with 3-0 Vicryl and 4-0 Monocryl.  Neoprobe was used to identify hotspot in left axilla. A 3 cm transverse incision was made dissection was carried down into level one axillary node basin. 3 sentinel nodes were identified one was hot and blue the other 2 were just hot. Background counts approached 0. Hemostasis achieved with cautery and Surgicel snow. Wounds closed with 3-0 Vicryl and 4-0 Monocryl. Liquid adhesive applied. Binder was placed. All counts are found to be correct. Patient awoke extubated taken to recovery in satisfactory condition.  

## 2016-01-22 NOTE — Anesthesia Procedure Notes (Addendum)
Anesthesia Regional Block:  Pectoralis block  Pre-Anesthetic Checklist: ,, timeout performed, Correct Patient, Correct Site, Correct Laterality, Correct Procedure, Correct Position, site marked, Risks and benefits discussed,  Surgical consent,  Pre-op evaluation,  At surgeon's request and post-op pain management  Laterality: Left and Upper  Prep: chloraprep       Needles:  Injection technique: Single-shot     Needle Length: 9cm 9 cm Needle Gauge: 21 and 21 G    Additional Needles:  Procedures: ultrasound guided (picture in chart) Pectoralis block Narrative:  Injection made incrementally with aspirations every 5 mL.  Performed by: Personally  Anesthesiologist: Franne Grip  Additional Notes: Tolerated well.   Procedure Name: LMA Insertion Date/Time: 01/22/2016 10:32 AM Performed by: Melynda Ripple D Pre-anesthesia Checklist: Patient identified, Emergency Drugs available, Suction available and Patient being monitored Patient Re-evaluated:Patient Re-evaluated prior to inductionOxygen Delivery Method: Circle system utilized Preoxygenation: Pre-oxygenation with 100% oxygen Intubation Type: IV induction Ventilation: Mask ventilation without difficulty LMA: LMA inserted LMA Size: 4.0 Number of attempts: 1 Airway Equipment and Method: Bite block Placement Confirmation: positive ETCO2 Tube secured with: Tape Dental Injury: Teeth and Oropharynx as per pre-operative assessment

## 2016-01-22 NOTE — Progress Notes (Signed)
Assisted Dr. Denenny with left, ultrasound guided, pectoralis block. Side rails up, monitors on throughout procedure. See vital signs in flow sheet. Tolerated Procedure well. 

## 2016-01-22 NOTE — H&P (Signed)
H&P   Katrina Young (MR# [id number])      H&P Info    Author Note Status Last Update User Last Update Date/Time   Erroll Luna, MD Signed Erroll Luna, MD     H&P    Expand All Collapse All   [place]   Location: [facility] Patient #: [id number] DOB: 1940/11/12 Married / Language: English / Race: White Female  History of Present Illness  Patient words: patient sent at the requestof Dr. 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 Patient noticed a small mass 4 months ago but no nipple discharge.  The patient is a 75 year old female.   Other Problems  Anxiety Disorder Arthritis Back Pain Bladder Problems Breast Cancer Cholelithiasis Gastroesophageal Reflux Disease Hemorrhoids High blood pressure Hypercholesterolemia Lump In Breast Migraine Headache Oophorectomy Bilateral. Ovarian Cancer Ventral Hernia Repair  Past Surgical History  Breast Biopsy Left, Right. Gallbladder Surgery - Laparoscopic Hysterectomy (due to cancer) - Complete Ventral / Umbilical Hernia Surgery Left.  Diagnostic Studies History  Colonoscopy 1-5 years ago >10 years ago Mammogram within last year Pap Smear 1-5 years ago  Medication History  Medications Reconciled  Social History  Caffeine use Coffee. No alcohol use No drug use Tobacco use Former smoker, Never smoker.  Family History  Alcohol Abuse Brother, Father. Arthritis Brother, Mother, Sister. Breast Cancer Sister. Cerebrovascular Accident Brother. Colon Cancer Sister. Diabetes Mellitus Family Members In [place], Sister, Son. Heart Disease Brother, Father, Mother. Hypertension Father, Mother. Kidney Disease Sister. Melanoma Brother, Family Members In General. Prostate Cancer Brother.  Pregnancy / Birth History  Age at menarche 78  years. Gravida 3 Irregular periods Maternal age 24-30 21-25 Para 3     Review of Systems  General Not Present- Appetite Loss, Chills, Fatigue, Fever, Night Sweats, Weight Gain and Weight Loss. Skin Present- Dryness, New Lesions and Non-Healing Wounds. Not Present- Change in Wart/Mole, Hives, Jaundice, Rash and Ulcer. HEENT Present- Hearing Loss, Nose Bleed, Ringing in the Ears, Seasonal Allergies and Wears glasses/contact lenses. Not Present- Earache, Hoarseness, Oral Ulcers, Sinus Pain, Sore Throat, Visual Disturbances and Yellow Eyes. Respiratory Present- Chronic Cough and Snoring. Not Present- Bloody sputum, Difficulty Breathing and Wheezing. Breast Present- Breast Mass. Not Present- Breast Pain, Nipple Discharge and Skin Changes. Cardiovascular Present- Swelling of Extremities. Not Present- Chest Pain, Difficulty Breathing Lying Down, Leg Cramps, Palpitations, Rapid Heart Rate and Shortness of Breath. Gastrointestinal Present- Constipation, Difficulty Swallowing, Gets full quickly at meals, Hemorrhoids and Indigestion. Not Present- Abdominal Pain, Bloating, Bloody Stool, Change in Bowel Habits, Chronic diarrhea, Excessive gas, Nausea, Rectal Pain and Vomiting. Female Genitourinary Present- Frequency. Not Present- Nocturia, Painful Urination, Pelvic Pain and Urgency. Musculoskeletal Present- Back Pain, Joint Pain and Joint Stiffness. Not Present- Muscle Pain, Muscle Weakness and Swelling of Extremities. Psychiatric Present- Anxiety. Not Present- Bipolar, Change in Sleep Pattern, Depression, Fearful and Frequent crying. Endocrine Present- Cold Intolerance and Heat Intolerance. Not Present- Excessive Hunger, Hair Changes, Hot flashes and New Diabetes. Hematology Present- Easy Bruising. Not Present- Excessive bleeding, Gland problems, HIV and Persistent Infections.   Physical Exam   General Mental Status-Alert. General Appearance-Consistent with stated age. Hydration-Well  hydrated. Voice-Normal.  Eye Eyeball - Bilateral-Extraocular movements intact. Sclera/Conjunctiva - Bilateral-No scleral icterus.  Chest and Lung Exam Chest and lung exam reveals -quiet, even and easy respiratory effort with no use of accessory muscles and on auscultation, normal breath  sounds, no adventitious sounds and normal vocal resonance. Inspection Chest Wall - Normal. Back - normal.  Breast Breast - Left-Symmetric, Non Tender, No Biopsy scars, no Dimpling, No Inflammation, No Lumpectomy scars, No Mastectomy scars, No Peau d' Orange. Breast - Right-Symmetric, Non Tender, No Biopsy scars, no Dimpling, No Inflammation, No Lumpectomy scars, No Mastectomy scars, No Peau d' Orange. Breast Lump-No Palpable Breast Mass. Note: bruising left breast  Cardiovascular Cardiovascular examination reveals -normal heart sounds, regular rate and rhythm with no murmurs and normal pedal pulses bilaterally.  Neurologic Neurologic evaluation reveals -alert and oriented x 3 with no impairment of recent or remote memory. Mental Status-Normal.  Musculoskeletal Normal Exam - Left-Upper Extremity Strength Normal and Lower Extremity Strength Normal. Normal Exam - Right-Upper Extremity Strength Normal and Lower Extremity Strength Normal.  Lymphatic Head & Neck  General Head & Neck Lymphatics: Bilateral - Description - Normal. Axillary  General Axillary Region: Bilateral - Description - Normal. Tenderness - Non Tender.    Assessment & Plan    BREAST CANCER, LEFT (C50.912) Impression: discussed breast conservation versus mastectomy with reconstruction. She has oaxillary sentinel lymph node mapping.my with left axillary sentinel lymph node mapping. Risk of lumpectomy include bleeding, infection, seroma, more surgery, use of seed/wire, wound care, cosmetic deformity and the need for other treatments, death , blood clots, death. Pt agrees to proceed. Risk of sentinel lymph  node mapping include bleeding, infection, lymphedema, shoulder pain. stiffness, dye allergy. cosmetic deformity , blood clots, death, need for more surgery. Pt agres to proceed.  Current Plans You are being scheduled for surgery - Our schedulers will call you.  You should hear from our office's scheduling department within 5 working days about the location, date, and time of surgery. We try to make accommodations for patient's preferences in scheduling surgery, but sometimes the OR schedule or the surgeon's schedule prevents [place] from making those accommodations.  If you have not heard from our office [phone]) in 5 working days, call the office and ask for your surgeon's nurse.  If you have other questions about your diagnosis, plan, or surgery, call the office and ask for your surgeon's nurse.  Pt Education - [facility] Breast Cancer Information Given - [facility] "Breast Journey" Package We discussed the staging and pathophysiology of breast cancer. We discussed all of the different options for treatment for breast cancer including surgery, chemotherapy, radiation therapy, Herceptin, and antiestrogen therapy. We discussed a sentinel lymph node biopsy as she does not appear to having lymph node involvement right now. We discussed the performance of that with injection of radioactive tracer and blue dye. We discussed that she would have an incision underneath her axillary hairline. We discussed that there is a bout a 10-20% chance of having a positive node with a sentinel lymph node biopsy and we will await the permanent pathology to make any other first further decisions in terms of her treatment. One of these options might be to return to the operating room to perform an axillary lymph node dissection. We discussed about a 1-2% risk lifetime of chronic shoulder pain as well as lymphedema associated with a sentinel lymph node biopsy. We discussed the options for treatment of the breast cancer which included  lumpectomy versus a mastectomy. We discussed the performance of the lumpectomy with a wire placement. We discussed a 10-20% chance of a positive margin requiring reexcision in the operating room. We also discussed that she may need radiation therapy or antiestrogen therapy or both if she undergoes lumpectomy.  We discussed the mastectomy and the postoperative care for that as well. We discussed that there is no difference in her survival whether she undergoes lumpectomy with radiation therapy or antiestrogen therapy versus a mastectomy. There is a slight difference in the local recurrence rate being 3-5% with lumpectomy and about 1% with a mastectomy. We discussed the risks of operation including bleeding, infection, possible reoperation. She understands her further therapy will be based on what her stages at the time of her operation.  Pt Education - [facility] breast cancer surgery: discussed with patient and provided information. Pt Education - [facility] Breast Biopsy HCI: discussed with patient and provided information. Pt Education - ABC (After Breast Cancer) Class Info: discussed with patient and provided information.

## 2016-01-22 NOTE — Anesthesia Postprocedure Evaluation (Signed)
Anesthesia Post Note  Patient: Katrina Young  Procedure(s) Performed: Procedure(s) (LRB): LEFT BREAST RADIOACTIVE SEED GUIDED PARTIAL MASTECTOMY WITH AXILLARY SENTINEL LYMPH NODE BIOPSY (Left)  Patient location during evaluation: PACU Anesthesia Type: General Level of consciousness: awake and alert Pain management: pain level controlled Vital Signs Assessment: post-procedure vital signs reviewed and stable Respiratory status: spontaneous breathing, nonlabored ventilation, respiratory function stable and patient connected to nasal cannula oxygen Cardiovascular status: blood pressure returned to baseline and stable Postop Assessment: no signs of nausea or vomiting Anesthetic complications: no    Last Vitals:  Filed Vitals:   01/22/16 1300 01/22/16 1316  BP: 163/80 157/77  Pulse: 63   Temp:  36.9 C  Resp: 12 20    Last Pain:  Filed Vitals:   01/22/16 1334  PainSc: 3                  Braxxton Stoudt J

## 2016-01-22 NOTE — Interval H&P Note (Signed)
History and Physical Interval Note:  01/22/2016 10:00 AM  Katrina Young  has presented today for surgery, with the diagnosis of LEFT BREAST CANCER  The various methods of treatment have been discussed with the patient and family. After consideration of risks, benefits and other options for treatment, the patient has consented to  Procedure(s): LEFT BREAST RADIOACTIVE SEED GUIDED PARTIAL MASTECTOMY WITH AXILLARY SENTINEL LYMPH NODE BIOPSY (Left) as a surgical intervention .  The patient's history has been reviewed, patient examined, no change in status, stable for surgery.  I have reviewed the patient's chart and labs.  Questions were answered to the patient's satisfaction.     Wilmoth Rasnic A.

## 2016-01-22 NOTE — Transfer of Care (Signed)
Immediate Anesthesia Transfer of Care Note  Patient: Katrina Young  Procedure(s) Performed: Procedure(s): LEFT BREAST RADIOACTIVE SEED GUIDED PARTIAL MASTECTOMY WITH AXILLARY SENTINEL LYMPH NODE BIOPSY (Left)  Patient Location: PACU  Anesthesia Type:GA combined with regional for post-op pain  Level of Consciousness: awake and alert   Airway & Oxygen Therapy: Patient Spontanous Breathing and Patient connected to face mask oxygen  Post-op Assessment: Report given to RN and Post -op Vital signs reviewed and stable  Post vital signs: Reviewed and stable  Last Vitals:  Filed Vitals:   01/22/16 1020 01/22/16 1025  BP:    Pulse: 69 73  Temp:    Resp: 20 16    Last Pain: There were no vitals filed for this visit.       Complications: No apparent anesthesia complications

## 2016-01-22 NOTE — Discharge Instructions (Signed)
[facility] °Office Phone Number [phone] ° °BREAST BIOPSY/ PARTIAL MASTECTOMY: POST OP INSTRUCTIONS ° °Always review your discharge instruction sheet given to you by the facility where your surgery was performed. ° °IF YOU HAVE DISABILITY OR FAMILY LEAVE FORMS, YOU MUST BRING THEM TO THE OFFICE FOR PROCESSING.  DO NOT GIVE THEM TO YOUR DOCTOR. ° °1. A prescription for pain medication may be given to you upon discharge.  Take your pain medication as prescribed, if needed.  If narcotic pain medicine is not needed, then you may take acetaminophen (Tylenol) or ibuprofen (Advil) as needed. °2. Take your usually prescribed medications unless otherwise directed °3. If you need a refill on your pain medication, please contact your pharmacy.  They will contact our office to request authorization.  Prescriptions will not be filled after 5pm or on week-ends. °4. You should eat very light the first 24 hours after surgery, such as soup, crackers, pudding, etc.  Resume your normal diet the day after surgery. °5. Most patients will experience some swelling and bruising in the breast.  Ice packs and a good support bra will help.  Swelling and bruising can take several days to resolve.  °6. It is common to experience some constipation if taking pain medication after surgery.  Increasing fluid intake and taking a stool softener will usually help or prevent this problem from occurring.  A mild laxative (Milk of Magnesia or Miralax) should be taken according to package directions if there are no bowel movements after 48 hours. °7. Unless discharge instructions indicate otherwise, you may remove your bandages 24-48 hours after surgery, and you may shower at that time.  You may have steri-strips (small skin tapes) in place directly over the incision.  These strips should be left on the skin for 7-10 days.  If your surgeon used skin glue on the incision, you may shower in 24 hours.  The glue will flake off over the  next 2-3 weeks.  Any sutures or staples will be removed at the office during your follow-up visit. °8. ACTIVITIES:  You may resume regular daily activities (gradually increasing) beginning the next day.  Wearing a good support bra or sports bra minimizes pain and swelling.  You may have sexual intercourse when it is comfortable. °a. You may drive when you no longer are taking prescription pain medication, you can comfortably wear a seatbelt, and you can safely maneuver your car and apply brakes. °b. RETURN TO WORK:  ______________________________________________________________________________________ °9. You should see your doctor in the office for a follow-up appointment approximately two weeks after your surgery.  Your doctor’s nurse will typically make your follow-up appointment when she calls you with your pathology report.  Expect your pathology report 2-3 business days after your surgery.  You may call to check if you do not hear from us after three days. °10. OTHER INSTRUCTIONS: _______________________________________________________________________________________________ _____________________________________________________________________________________________________________________________________ °_____________________________________________________________________________________________________________________________________ °_____________________________________________________________________________________________________________________________________ ° °WHEN TO CALL YOUR DOCTOR: °1. Fever over 101.0 °2. Nausea and/or vomiting. °3. Extreme swelling or bruising. °4. Continued bleeding from incision. °5. Increased pain, redness, or drainage from the incision. ° °The clinic staff is available to answer your questions during regular business hours.  Please don’t hesitate to call and ask to speak to one of the nurses for clinical concerns.  If you have a medical emergency, go to the nearest  emergency room or call 911.  A surgeon from [facility] is always on call at the hospital. ° °For further questions, please visit [email or web address]  ° ° ° °  Post Anesthesia Home Care Instructions ° °Activity: °Get plenty of rest for the remainder of the day. A responsible adult should stay with you for 24 hours following the procedure.  °For the next 24 hours, DO NOT: °-Drive a car °-Operate machinery °-Drink alcoholic beverages °-Take any medication unless instructed by your physician °-Make any legal decisions or sign important papers. ° °Meals: °Start with liquid foods such as gelatin or soup. Progress to regular foods as tolerated. Avoid greasy, spicy, heavy foods. If nausea and/or vomiting occur, drink only clear liquids until the nausea and/or vomiting subsides. Call your physician if vomiting continues. ° °Special Instructions/Symptoms: °Your throat may feel dry or sore from the anesthesia or the breathing tube placed in your throat during surgery. If this causes discomfort, gargle with warm salt water. The discomfort should disappear within 24 hours. ° °If you had a scopolamine patch placed behind your ear for the management of post- operative nausea and/or vomiting: ° °1. The medication in the patch is effective for 72 hours, after which it should be removed.  Wrap patch in a tissue and discard in the trash. Wash hands thoroughly with soap and water. °2. You may remove the patch earlier than 72 hours if you experience unpleasant side effects which may include dry mouth, dizziness or visual disturbances. °3. Avoid touching the patch. Wash your hands with soap and water after contact with the patch. °  ° °

## 2016-01-22 NOTE — Anesthesia Preprocedure Evaluation (Signed)
Anesthesia Evaluation  Patient identified by MRN, date of birth, ID band Patient awake    Reviewed: Allergy & Precautions, NPO status , Patient's Chart, lab work & pertinent test results  History of Anesthesia Complications (+) PONV and history of anesthetic complications  Airway Mallampati: II  TM Distance: >3 FB Neck ROM: Full    Dental no notable dental hx.    Pulmonary shortness of breath, former smoker,    Pulmonary exam normal breath sounds clear to auscultation       Cardiovascular hypertension, Pt. on medications and Pt. on home beta blockers + Peripheral Vascular Disease  Normal cardiovascular exam Rhythm:Regular Rate:Normal     Neuro/Psych negative neurological ROS  negative psych ROS   GI/Hepatic Neg liver ROS, GERD  ,  Endo/Other  negative endocrine ROS  Renal/GU negative Renal ROS  negative genitourinary   Musculoskeletal  (+) Arthritis ,   Abdominal   Peds negative pediatric ROS (+)  Hematology negative hematology ROS (+)   Anesthesia Other Findings   Reproductive/Obstetrics negative OB ROS                             Anesthesia Physical Anesthesia Plan  ASA: II  Anesthesia Plan: General   Post-op Pain Management:    Induction: Intravenous  Airway Management Planned: LMA  Additional Equipment:   Intra-op Plan:   Post-operative Plan: Extubation in OR  Informed Consent: I have reviewed the patients History and Physical, chart, labs and discussed the procedure including the risks, benefits and alternatives for the proposed anesthesia with the patient or authorized representative who has indicated his/her understanding and acceptance.   Dental advisory given  Plan Discussed with: CRNA  Anesthesia Plan Comments:         Anesthesia Quick Evaluation

## 2016-01-23 ENCOUNTER — Encounter (HOSPITAL_BASED_OUTPATIENT_CLINIC_OR_DEPARTMENT_OTHER): Payer: Self-pay | Admitting: Surgery

## 2016-01-30 ENCOUNTER — Encounter: Payer: Self-pay | Admitting: Hematology and Oncology

## 2016-01-30 ENCOUNTER — Ambulatory Visit (HOSPITAL_BASED_OUTPATIENT_CLINIC_OR_DEPARTMENT_OTHER): Payer: Medicare Other | Admitting: Hematology and Oncology

## 2016-01-30 VITALS — BP 140/66 | HR 53 | Temp 98.3°F | Resp 17 | Ht 63.5 in | Wt 164.1 lb

## 2016-01-30 DIAGNOSIS — C50512 Malignant neoplasm of lower-outer quadrant of left female breast: Secondary | ICD-10-CM

## 2016-01-30 NOTE — Progress Notes (Signed)
Patient Care Team: Leanna Battles, [name], [name], [name], MD as Consulting Physician (Radiation Oncology)  DIAGNOSIS: Breast cancer of lower-outer quadrant of left female breast [facility])   Staging form: Breast, AJCC 7th Edition     Clinical stage from 12/17/2015: Stage IA (T1c, N0, M0) - Signed by Nicholas Lose, MD on 12/17/2015   SUMMARY OF ONCOLOGIC HISTORY:   Breast cancer of lower-outer quadrant of left female breast ([place])   12/11/2015 Initial Diagnosis Left breast mass at 3:00: 1.7 x 1.6 x 1.2 cm, axilla negative, grade 2 IDC with DCIS, ER/PR positive HER-2 negative Ki-67 15%, T1 cN0 stage IA clinical stage   01/22/2016 Surgery Left lumpectomy: IDC grade 1, 2.1 cm, with intermediate grade DCIS, LVI present, PNI present, 0/2 lymph nodes negative, ER 95%, PR 90%, HER-2 negative ratio 1.28, Ki-67 15%, T2 N0 stage II a    CHIEF COMPLIANT: Follow-up after recent lumpectomy  INTERVAL HISTORY: Katrina Young is a 75 year old with above-mentioned history left breast cancer underwent lumpectomy and is here to discuss the pathology report. She is healing very well from surgery. She is not requiring any pain medications.  REVIEW OF SYSTEMS:   Constitutional: Denies fevers, chills or abnormal weight loss Eyes: Denies blurriness of vision Ears, nose, mouth, throat, and face: Denies mucositis or sore throat Respiratory: Denies cough, dyspnea or wheezes Cardiovascular: Denies palpitation, chest discomfort Gastrointestinal:  Denies nausea, heartburn or change in bowel habits Skin: Denies abnormal skin rashes Lymphatics: Denies new lymphadenopathy or easy bruising Neurological:Denies numbness, tingling or new weaknesses Behavioral/Psych: Mood is stable, no new changes  Extremities: No lower extremity edema Breast: Recent left lumpectomy All other systems  were reviewed with the patient and are negative.  I have reviewed the past medical history, past surgical history, social history and family history with the patient and they are unchanged from previous note.  ALLERGIES:  is allergic to codeine and morphine and related.  MEDICATIONS:  Current Outpatient Prescriptions  Medication Sig Dispense Refill  . aspirin EC 81 MG tablet Take 81 mg by mouth daily.    Marland Kitchen atenolol (TENORMIN) 50 MG tablet Take 50 mg by mouth every morning.     . Calcium-Vitamin D-Vitamin K (CALCIUM SOFT CHEWS PO) Take by mouth.    . cholecalciferol (VITAMIN D) 1000 UNITS tablet Take 1,000 Units by mouth daily.    Marland Kitchen esomeprazole (NEXIUM) 40 MG capsule Take 40 mg by mouth daily before breakfast.     . HYDROcodone-acetaminophen (NORCO) 5-325 MG tablet Take 1 tablet by mouth every 6 (six) hours as needed for moderate pain. 30 tablet 0  . ibuprofen (ADVIL,MOTRIN) 200 MG tablet Take 200 mg by mouth every 6 (six) hours as needed.    Marland Kitchen LORazepam (ATIVAN) 1 MG tablet Take 1 mg by mouth 2 (two) times daily as needed for anxiety.     Marland Kitchen losartan (COZAAR) 100 MG tablet     . Misc Natural Products (OSTEO BI-FLEX/5-LOXIN ADVANCED PO) Take by mouth.    . Multiple Vitamin (MULTIVITAMIN WITH MINERALS) TABS tablet Take 1 tablet by mouth daily.    . Omega 3 1000 MG CAPS Take 1 capsule by mouth daily.    . Phenazopyridine HCl (AZO TABS PO) Take 2 tablets by mouth daily.     . pravastatin (PRAVACHOL) 20 MG tablet Take 20 mg by mouth daily.     . promethazine (  PHENERGAN) 12.5 MG tablet Take 1 tablet (12.5 mg total) by mouth every 6 (six) hours as needed for nausea or vomiting. 30 tablet 0  . vitamin B-12 (CYANOCOBALAMIN) 1000 MCG tablet Take 1,000 mcg by mouth daily.     No current facility-administered medications for this visit.    PHYSICAL EXAMINATION: ECOG PERFORMANCE STATUS: 1 - Symptomatic but completely ambulatory  Filed Vitals:   01/30/16 1138  BP: 140/66  Pulse: 53  Temp: 98.3  F (36.8 C)  Resp: 17   Filed Weights   01/30/16 1138  Weight: 164 lb 1.6 oz (74.435 kg)    GENERAL:alert, no distress and comfortable SKIN: skin color, texture, turgor are normal, no rashes or significant lesions EYES: normal, Conjunctiva are pink and non-injected, sclera clear OROPHARYNX:no exudate, no erythema and lips, buccal mucosa, and tongue normal  NECK: supple, thyroid normal size, non-tender, without nodularity LYMPH:  no palpable lymphadenopathy in the cervical, axillary or inguinal LUNGS: clear to auscultation and percussion with normal breathing effort HEART: regular rate & rhythm and no murmurs and no lower extremity edema ABDOMEN:abdomen soft, non-tender and normal bowel sounds MUSCULOSKELETAL:no cyanosis of digits and no clubbing  NEURO: alert & oriented x 3 with fluent speech, no focal motor/sensory deficits EXTREMITIES: No lower extremity edema  LABORATORY DATA:  I have reviewed the data as listed   Chemistry      Component Value Date/Time   NA 141 01/19/2016 1020   NA 144 12/17/2015 0810   K 4.5 01/19/2016 1020   K 4.4 12/17/2015 0810   CL 105 01/19/2016 1020   CO2 29 01/19/2016 1020   CO2 25 12/17/2015 0810   BUN 13 01/19/2016 1020   BUN 17.0 12/17/2015 0810   CREATININE 0.64 01/19/2016 1020   CREATININE 0.8 12/17/2015 0810      Component Value Date/Time   CALCIUM 9.9 01/19/2016 1020   CALCIUM 9.8 12/17/2015 0810   ALKPHOS 93 01/19/2016 1020   ALKPHOS 104 12/17/2015 0810   AST 28 01/19/2016 1020   AST 24 12/17/2015 0810   ALT 25 01/19/2016 1020   ALT 23 12/17/2015 0810   BILITOT 0.6 01/19/2016 1020   BILITOT 0.59 12/17/2015 0810       Lab Results  Component Value Date   WBC 4.7 01/19/2016   HGB 12.7 01/19/2016   HCT 38.4 01/19/2016   MCV 86.3 01/19/2016   PLT 191 01/19/2016   NEUTROABS 2.8 01/19/2016     ASSESSMENT & PLAN:  Breast cancer of lower-outer quadrant of left female breast ([place]) Left lumpectomy 01/22/2016: IDC grade 1,  2.1 cm, with intermediate grade DCIS, LVI present, PNI present, 0/2 lymph nodes negative, ER 95%, PR 90%, HER-2 negative ratio 1.28, Ki-67 15%, T2 N0 stage II a  Pathology counseling: I discussed the final pathology report of the patient provided  a copy of this report. I discussed the margins as well as lymph node surgeries. We also discussed the final staging along with previously performed ER/PR and HER-2/neu testing.  Recommendation: 1. Oncotype DX testing to determine if chemotherapy would be of any benefit followed by 3. Adjuvant radiation therapy followed by 4. Adjuvant antiestrogen therapy   No orders of the defined types were placed in this encounter.   The patient has a good understanding of the overall plan. she agrees with it. she will call with any problems that may develop before the next visit here.   Rulon Eisenmenger, MD 01/30/2016

## 2016-01-30 NOTE — Assessment & Plan Note (Signed)
Left lumpectomy 01/22/2016: IDC grade 1, 2.1 cm, with intermediate grade DCIS, LVI present, PNI present, 0/2 lymph nodes negative, ER 95%, PR 90%, HER-2 negative ratio 1.28, Ki-67 15%, T2 N0 stage II a  Pathology counseling: I discussed the final pathology report of the patient provided  a copy of this report. I discussed the margins as well as lymph node surgeries. We also discussed the final staging along with previously performed ER/PR and HER-2/neu testing.  Recommendation: 1. Oncotype DX testing to determine if chemotherapy would be of any benefit followed by 3. Adjuvant radiation therapy followed by 4. Adjuvant antiestrogen therapy

## 2016-02-03 ENCOUNTER — Encounter: Payer: Self-pay | Admitting: *Deleted

## 2016-02-03 NOTE — Progress Notes (Signed)
Ordered oncotype per Dr. Gudena.  Faxed requisition to pathology and confirmed receipt. 

## 2016-02-04 DIAGNOSIS — I10 Essential (primary) hypertension: Secondary | ICD-10-CM | POA: Diagnosis not present

## 2016-02-04 DIAGNOSIS — R8299 Other abnormal findings in urine: Secondary | ICD-10-CM | POA: Diagnosis not present

## 2016-02-04 DIAGNOSIS — M859 Disorder of bone density and structure, unspecified: Secondary | ICD-10-CM | POA: Diagnosis not present

## 2016-02-04 DIAGNOSIS — N390 Urinary tract infection, site not specified: Secondary | ICD-10-CM | POA: Diagnosis not present

## 2016-02-04 DIAGNOSIS — E784 Other hyperlipidemia: Secondary | ICD-10-CM | POA: Diagnosis not present

## 2016-02-06 DIAGNOSIS — Z1212 Encounter for screening for malignant neoplasm of rectum: Secondary | ICD-10-CM | POA: Diagnosis not present

## 2016-02-10 DIAGNOSIS — K449 Diaphragmatic hernia without obstruction or gangrene: Secondary | ICD-10-CM | POA: Diagnosis not present

## 2016-02-10 DIAGNOSIS — I10 Essential (primary) hypertension: Secondary | ICD-10-CM | POA: Diagnosis not present

## 2016-02-10 DIAGNOSIS — C50912 Malignant neoplasm of unspecified site of left female breast: Secondary | ICD-10-CM | POA: Diagnosis not present

## 2016-02-10 DIAGNOSIS — M859 Disorder of bone density and structure, unspecified: Secondary | ICD-10-CM | POA: Diagnosis not present

## 2016-02-10 DIAGNOSIS — Z0000 Encounter for general adult medical examination without abnormal findings: Secondary | ICD-10-CM | POA: Diagnosis not present

## 2016-02-10 DIAGNOSIS — Z1389 Encounter for screening for other disorder: Secondary | ICD-10-CM | POA: Diagnosis not present

## 2016-02-10 DIAGNOSIS — E784 Other hyperlipidemia: Secondary | ICD-10-CM | POA: Diagnosis not present

## 2016-02-10 DIAGNOSIS — Z6829 Body mass index (BMI) 29.0-29.9, adult: Secondary | ICD-10-CM | POA: Diagnosis not present

## 2016-02-18 DIAGNOSIS — C50512 Malignant neoplasm of lower-outer quadrant of left female breast: Secondary | ICD-10-CM | POA: Diagnosis not present

## 2016-02-18 IMAGING — CT CT CHEST W/O CM
3 of 4 series · 16 of 30 positions shown, 17 images · non-contrast
Comparison: 07/15/2014

CLINICAL DATA: Right lung nodule. Remote history of ovarian cancer.
Surgery on the left breast.

EXAM:
CT CHEST WITHOUT CONTRAST
TECHNIQUE: Multidetector CT imaging of the chest was performed following the
standard protocol without IV contrast..

[Series 3: chest w/o · axial · non-contrast · 0.70mm/px · z∈[-212,-48]mm · 4 of 56 slices shown, 5 images]
[im 12/56  mediastinal]
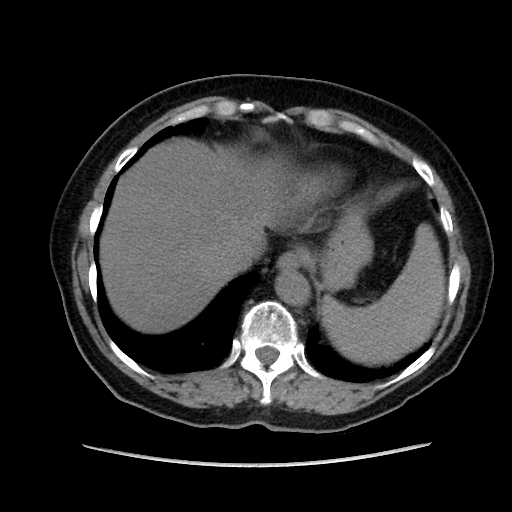
[im 12/56  lung]
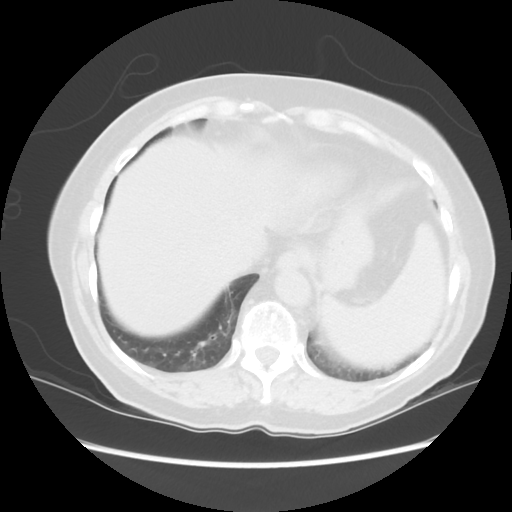
[im 23/56  lung]
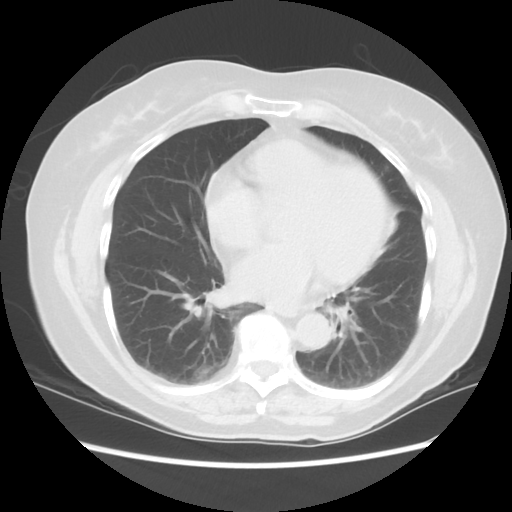
[im 34/56  lung]
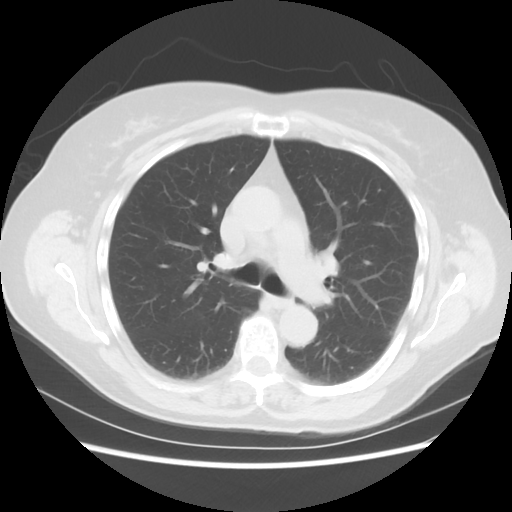
[im 45/56  lung]
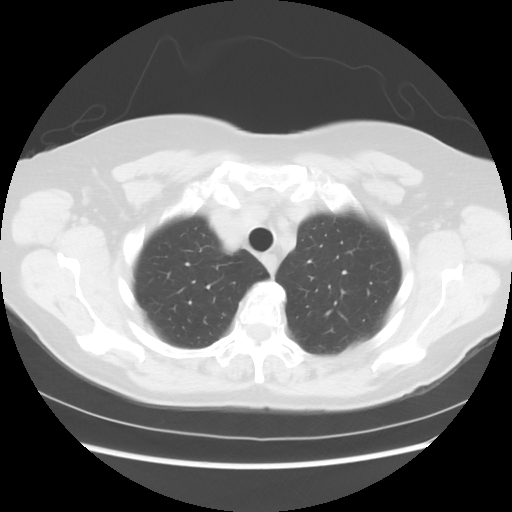

[Series 4: lung windows · axial · 0.70mm/px · z∈[-212,-48]mm · 4 of 56 slices shown]
[im 12/56  lung]
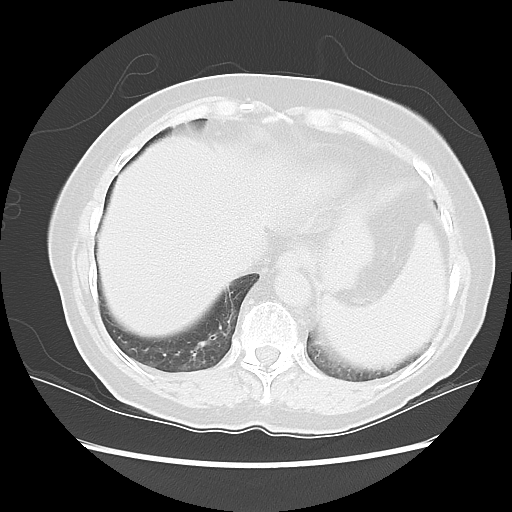
[im 23/56  lung]
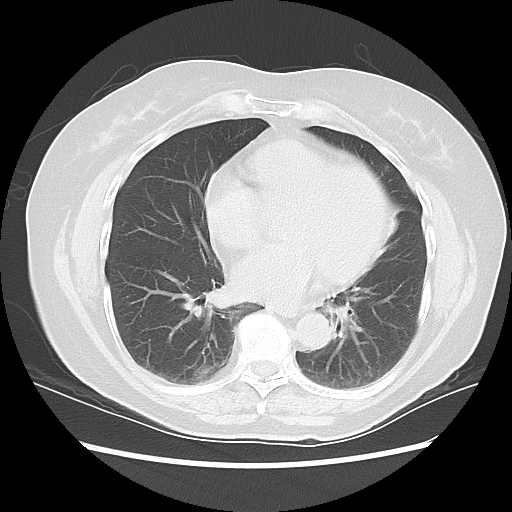
[im 34/56  lung]
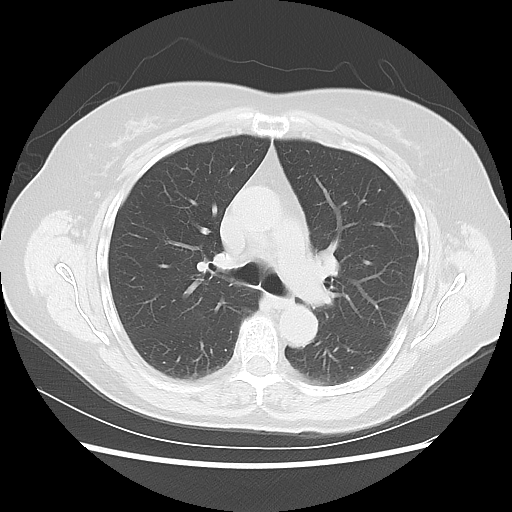
[im 45/56  lung]
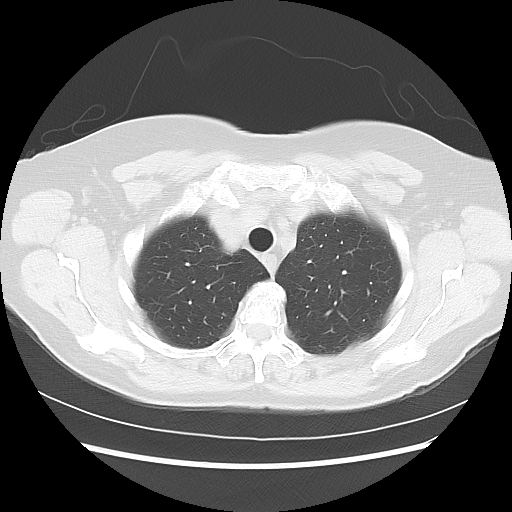

[Series 602: sagittal body · sagittal · 0.70mm/px · 8 of 145 slices shown]
[im 10/145  mediastinal]
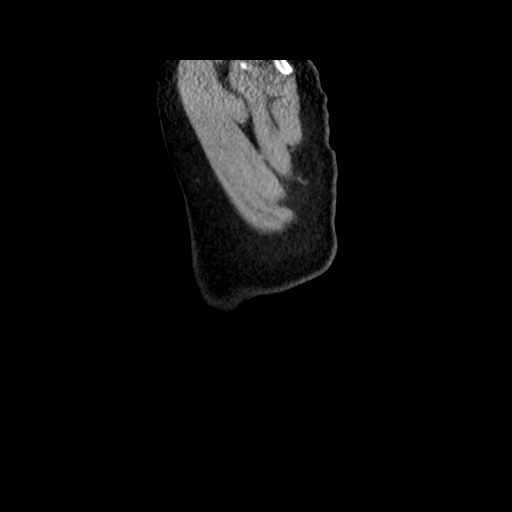
[im 29/145  mediastinal]
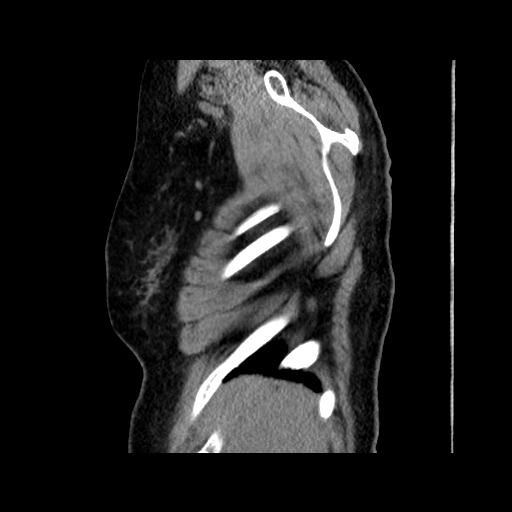
[im 49/145  mediastinal]
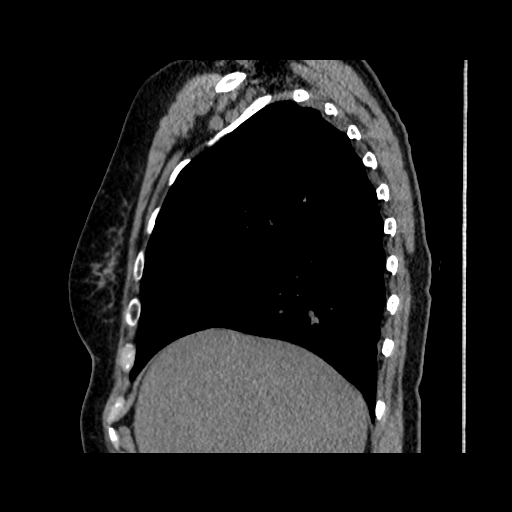
[im 68/145  mediastinal]
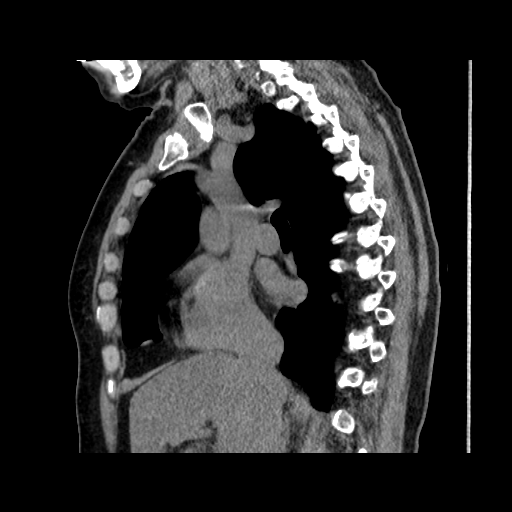
[im 77/145  mediastinal]
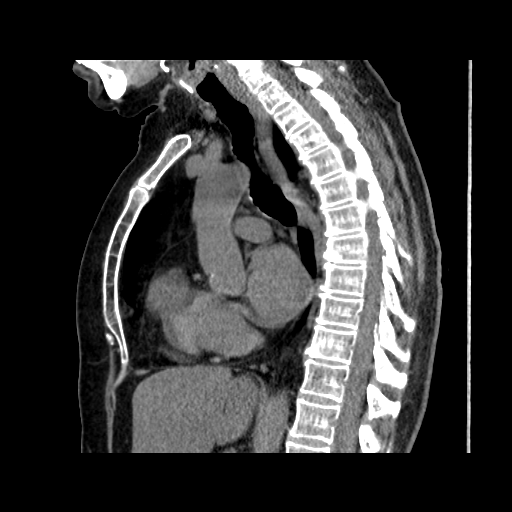
[im 97/145  mediastinal]
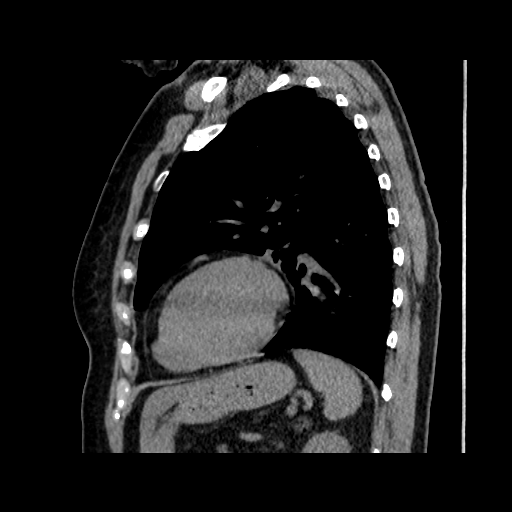
[im 116/145  mediastinal]
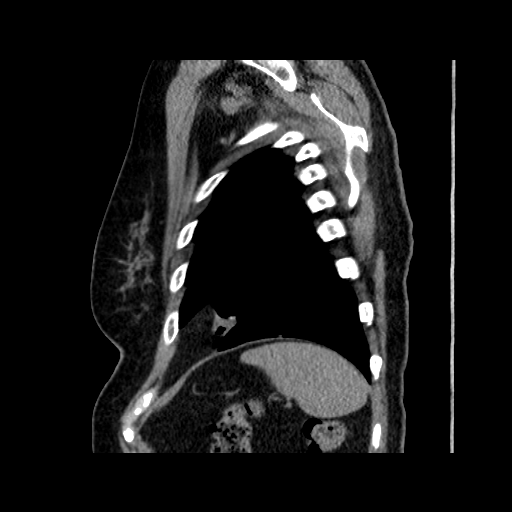
[im 135/145  mediastinal]
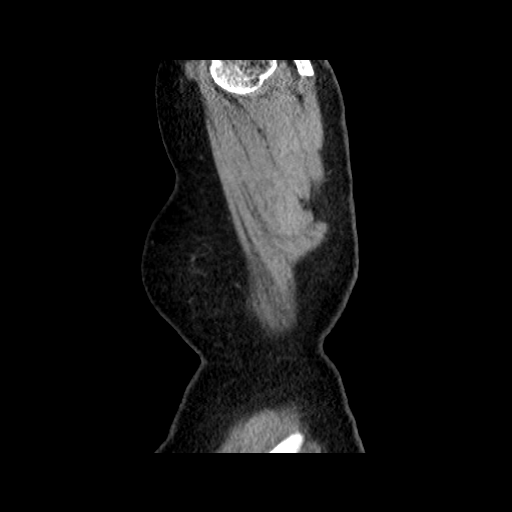

[16 of 30 positions shown; findings below may reference images not displayed]

FINDINGS: Mediastinum/Nodes: Mild aortic arch atherosclerotic calcification.
Mild cardiomegaly. No pathologic thoracic adenopathy. The

Lungs/Pleura: Several tiny subpleural lymph nodes along the minor
fissure measure 2-3 mm in size and given the position along the
fissure are considered benign. The radiographic abnormality is
thought to likely be the small sclerotic lesion in the right
posterior eighth rib shown on image 30 of series 4. This has benign
imaging characteristics.

0.8 by 0.2 by 0.2 cm linear density in the right lower lobe, image
36 series 4, potentially a plugged bronchiectatic airway or focus of
linear atelectasis, similar appearance on the 04/15/2010 exam.
Densely calcified granuloma posteromedially in the left lower lobe,
image 38 series for.

Upper abdomen: Unremarkable

Musculoskeletal: Benign-appearing sclerotic lesion in the right
eighth rib accounts for the density on radiography. Mild thoracic
spondylosis.
IMPRESSION: 1. A small benign-appearing sclerotic lesion in the right eighth rib
accounts for the density on radiography. There are several tiny
benign subpleural lymph nodes along the right minor fissure, these
do not have any worrisome characteristics.
2. Linear nodular density in the right lower lobe is relatively
stable from 4111 and considered benign. There is also a benign
calcified granuloma posteromedially in the left lower lobe.
3. Atherosclerosis and mild cardiomegaly.

## 2016-02-19 ENCOUNTER — Telehealth: Payer: Self-pay | Admitting: *Deleted

## 2016-02-19 ENCOUNTER — Encounter (HOSPITAL_COMMUNITY): Payer: Self-pay

## 2016-02-19 NOTE — Telephone Encounter (Signed)
Received oncotype score of 23/15% Per Dr. Lindi Adie no chemo. Left vm for pt to return call to discuss results Physician team notified.

## 2016-02-19 NOTE — Telephone Encounter (Signed)
Pt return call. Gave oncotype score of 23 Discussed next step in treatment which is xrt. Informed she will receive a call to schedule an appt with Dr. [name], radiation oncologist. Denies needs or further questions at this time. Encourage pt to call with needs.

## 2016-02-26 NOTE — Progress Notes (Signed)
Location of Breast Cancer: Left Breast  Lower -Outer Quadrant  Seen in Breast Clinic 12/17/15  Histology per Pathology Report: Diagnosis 12/09/15: Breast, left, needle core biopsy, 3:30 o'clock- INVASIVE AND IN SITU DUCTAL CARCINOMA.- FIBROCYSTIC CHANGES  Receptor Status: ER(95%+), PR (90%+), Her2-neu (neg ratio =1.28), Ki-(15%)  Did patient present with symptoms (if so, please note symptoms) or was this found on screening mammography?: routine screening mammogram   Past/Anticipated interventions by surgeon, if EKB:TCYELYHTM 01/22/2016:Dr. Marcello Moores [name],MD: 1. 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 - LYMPHOVASCULAR INVASION IS IDENTIFIED.- PERINEURAL INVASION IS IDENTIFIED.- INVASIVE CARCINOMA IS BROADLY LESS THAN 0.1 CM TO THE LATERAL MARGIN OF SPECIMEN #1.- DUCTAL CARCINOMA IN SITU IS BROADLY LESS THAN 0.1 CM TO THE POSTERIOR MARGIN OF SPECIMEN 1 ANDFOCALLY LESS THAN 0.1 CM TO THE LATERAL MARGIN OF SPECIMEN #1.- SEE ONCOLOGY TABLE BELOW. 2. Breast, excision, Left Medial Margin - FIBROCYSTIC CHANGES WITH USUAL DUCTAL HYPERPLASIA AND CALCIFICATIONS.- THERE IS NO EVIDENCE OF MALIGNANCY FINAL for Witkop, Ilean L ([id number])- SEE COMMENT. 3. Breast, excision, Left Lateral Margin - FIBROCYSTIC CHANGES WITH USUAL DUCTAL HYPERPLASIA.- THERE IS NO EVIDENCE OF MALIGNANCY.- SEE COMMENT. 4. Breast, excision, Left Posterior Margin - FIBROCYSTIC CHANGES WITH USUAL DUCTAL HYPERPLASIA.- SKELETAL MUSCLE.- THERE IS NO EVIDENCE OF MALIGNANCY.- SEE COMMENT. 5. Lymph node, sentinel, biopsy, Left axillary - THERE IS NO EVIDENCE OF CARCINOMA IN 1 OF 1 LYMPH NODE (0/1). 6. Lymph node, sentinel, biopsy, Left axillary - THERE IS NO EVIDENCE OF CARCINOMA IN 1 OF 1 LYMPH NODE (0/1).  Past/Anticipated interventions by medical oncology, if any: Chemotherapy :Seen in Breast Clinic 12/17/15,   Dr. Lindi Adie, MD,Oncotype results=23 seen 01/30/16 follow up visit;  Lymphedema issues, if any:   Pain issues, if any:   SAFETY ISSUES:  Prior radiation?   Pacemaker/ICD?   Possible current pregnancy? N/A  Is the patient on methotrexate?   Current Complaints / other details:  Menarche age 73, G73 P3 41st live birth age 77,HRT 10-15 years,HX Ovarian cancer age  Nephrectomy and hysterectomy, Mother father heart disease,1  Sister breast cancer, another sister colon cancer Brother deceased Leukemia,,another brother melanoma,2 brothers prostate ca,maternal niece breast cancer,    Rebecca Eaton, RN 02/26/2016,8:07 AM  Allergies:Morphine & Codeine, nausea

## 2016-03-01 ENCOUNTER — Ambulatory Visit
Admission: RE | Admit: 2016-03-01 | Discharge: 2016-03-01 | Disposition: A | Discharge status: Home / Self Care | Payer: Medicare Other | Source: Ambulatory Visit | Attending: Radiation Oncology | Admitting: Radiation Oncology

## 2016-03-01 DIAGNOSIS — C50412 Malignant neoplasm of upper-outer quadrant of left female breast: Secondary | ICD-10-CM | POA: Diagnosis not present

## 2016-03-01 DIAGNOSIS — Z9889 Other specified postprocedural states: Secondary | ICD-10-CM | POA: Diagnosis not present

## 2016-03-01 DIAGNOSIS — Z7982 Long term (current) use of aspirin: Secondary | ICD-10-CM | POA: Insufficient documentation

## 2016-03-01 DIAGNOSIS — Z510 Encounter for antineoplastic radiation therapy: Secondary | ICD-10-CM | POA: Insufficient documentation

## 2016-03-01 DIAGNOSIS — Z79899 Other long term (current) drug therapy: Secondary | ICD-10-CM | POA: Insufficient documentation

## 2016-03-01 DIAGNOSIS — C50512 Malignant neoplasm of lower-outer quadrant of left female breast: Secondary | ICD-10-CM | POA: Insufficient documentation

## 2016-03-01 DIAGNOSIS — Z170 Estrogen receptor positive status [ER+]: Secondary | ICD-10-CM | POA: Diagnosis not present

## 2016-03-01 NOTE — Progress Notes (Signed)
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  Narrative:  The patient returns today for follow up after initial consultation with Dr. Pablo Ledger 12/17/2015. She had an abnormal routine screening mammogram in November 2016. Diagnostic mammogram and ultrasound showed a vague area of shadowing in the left breast near the nipple around the 4:00 position. She was scheduled to have a 6 month follow up mammogram. She had another diagnostic mammogram and ultrasound 12/03/2015 that showed a spiculated mass in the upper outer quadrant of the left breast and ultrasound confirmed this showing a hypoechoic mass at the 3:00 position 3 CFN. Biopsy 12/09/2015 revealed the mass was invasive and in situ ductal carcinoma, grade 2, ER (95%), PR (90%), Her2-neu negative and Ki 67 (15%).  She presents today after her lumpectomy 01/22/2016 that showed invasive ductal carcinoma, grade I/III, spanning 2.1 cm an ductal carcinoma in situ, intermediate grade. Lymphovascular and perineural invasion was identified. She had 0/2 positive lymph nodes. Oncotype testing revealed her score of 23, and is not going to receive chemotherapy.  On review of systems, the patient reports that she is doing well overall. She denies any edema in her extremities or in her axillary region. She denies any chest pain, shortness of breath, cough, fevers, chills, night sweats, unintended weight changes. She denies any bowel or bladder disturbances, and denies abdominal pain, nausea or vomiting. She denies any new musculoskeletal or joint aches or pains. A complete review of systems is obtained and is otherwise negative.   Past Medical History:  Past Medical History:  Diagnosis Date    . Arthritis    legs, back. neck  . Breast cancer ([place])   . Breast cancer of lower-outer quadrant of left female breast ([place]) 12/11/2015  . Cancer ([place])    ovarian cancer (hysterectomy-12-14 years ago) no txs  . Complication of anesthesia   . Constipation   . Diminished eyesight change in eyesight   cataracts  . Family history of breast cancer   . Family history of colon cancer   . Family history of melanoma   . Family history of prostate cancer   . GERD (gastroesophageal reflux disease)   . 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 Hyperlipidemia   . Hypertension   . Joint pain   . Leg pain   . PONV (postoperative nausea and vomiting)   . Reflux   . Shortness of breath dyspnea    increased walking or climbing stairs  . Varicose veins   . Weight gain     Past Surgical History: Past Surgical History:  Procedure Laterality Date  . ABDOMINAL HYSTERECTOMY     total hx of ovarian cancer   . BLADDER SURGERY    . BREAST SURGERY  30-40 years   hx of removal of calcum build up -right breast   . CESAREAN SECTION  1966, 1968, 1973  . CHOLECYSTECTOMY    . colonscopy     . ENDOVENOUS ABLATION SAPHENOUS VEIN W/ LASER  12-30-2011   left greater saphenous vein  . EVLA  R  GSV  02-10-2011  . EVLA   RIGHT SMALL SAPHENOUS VEIN 09-13-2007  . RADIOACTIVE SEED GUIDED MASTECTOMY WITH AXILLARY SENTINEL LYMPH NODE BIOPSY Left 01/22/2016  Procedure: LEFT BREAST RADIOACTIVE SEED GUIDED PARTIAL MASTECTOMY WITH AXILLARY SENTINEL LYMPH NODE BIOPSY;  Surgeon: Erroll Luna, MD;  Location: [place];  Service: General;  Laterality: Left;  . SHOULDER SURGERY     bilat - rotaor cuff repair secondary to tear  . TONSILLECTOMY    . VENTRAL HERNIA REPAIR N/A 07/23/2014   Procedure: VENTRAL INCISIONAL HERNIA REPAIR WITH MESH;  Surgeon: Armandina Gemma, MD;  Location: [facility];  Service: General;  Laterality: N/A;    Social History:  Social History   Social History  . Marital status: Married     Spouse name: Sonia Side  . Number of children: 2  . Years of education: N/A   Occupational History  . Not on file.   Social History Main Topics  . Smoking status: Former Smoker    Years: 2.00    Types: Cigarettes    Quit date: 07/26/1966  . Smokeless tobacco: Never Used  . Alcohol use No  . Drug use: No  . Sexual activity: Not on file   Other Topics Concern  . Not on file   Social History Narrative  . No narrative on file    Family History: Family History  Problem Relation Age of Onset  . Heart disease Mother   . Hypertension Father   . Heart disease Father   . Breast cancer Sister 46  . Leukemia Brother 11  . Melanoma Brother 25    on his chest  . Prostate cancer Brother 3  . Colon cancer Sister 70  . Melanoma Brother 57  . Diabetes Sister   . Heart disease Sister   . Prostate cancer Brother 32  . Stroke Brother   . Breast cancer Other 50  . Cancer Other     unknown - died quickly  . Breast cancer Cousin     dx 35-40 yrs, maternal first cousin  . Dementia Cousin     ALLERGIES:  is allergic to codeine and morphine and related.  Meds: Current Outpatient Prescriptions  Medication Sig Dispense Refill  . aspirin EC 81 MG tablet Take 81 mg by mouth daily.    Marland Kitchen atenolol (TENORMIN) 50 MG tablet Take 50 mg by mouth every morning.     . Calcium-Vitamin D-Vitamin K (CALCIUM SOFT CHEWS PO) Take by mouth.    . cholecalciferol (VITAMIN D) 1000 UNITS tablet Take 1,000 Units by mouth daily.    Marland Kitchen esomeprazole (NEXIUM) 40 MG capsule Take 40 mg by mouth daily before breakfast.     . ibuprofen (ADVIL,MOTRIN) 200 MG tablet Take 200 mg by mouth every 6 (six) hours as needed.    Marland Kitchen LORazepam (ATIVAN) 1 MG tablet Take 1 mg by mouth 2 (two) times daily as needed for anxiety.     Marland Kitchen losartan (COZAAR) 100 MG tablet Take 100 mg by mouth daily.     . Misc Natural Products (OSTEO BI-FLEX/5-LOXIN ADVANCED PO) Take by mouth.    . Multiple Vitamin (MULTIVITAMIN WITH MINERALS) TABS tablet  Take 1 tablet by mouth daily.    . Omega 3 1000 MG CAPS Take 1 capsule by mouth daily.    . Phenazopyridine HCl (AZO TABS PO) Take 2 tablets by mouth daily.     . pravastatin (PRAVACHOL) 20 MG tablet Take 20 mg by mouth daily.     . promethazine (PHENERGAN) 12.5 MG tablet Take 1 tablet (12.5 mg total) by mouth every 6 (six) hours as needed for nausea or vomiting. (Patient not taking: Reported on  03/01/2016) 30 tablet 0  . vitamin B-12 (CYANOCOBALAMIN) 1000 MCG tablet Take 1,000 mcg by mouth daily.     No current facility-administered medications for this encounter.     Physical Findings:  Vitals with BMI 03/01/2016  Height 5' 3.5"  Weight 163 lbs 10 oz  BMI 87.5  Systolic 643  Diastolic 58  Pulse 52  Respirations 16   In general this is a well appearing caucasian woman in no acute distress. She's alert and oriented x4 and appropriate throughout the examination. Cardiopulmonary assessment is negative for acute distress and she exhibits normal effort. She has some fluid accumulation that is approximately 3 cm in the left axilla that is non-erythematous and non-tender. The left breast lumpectomy scar and axillary scar are well healed without cellulitic change.   Lab Findings: Lab Results  Component Value Date   WBC 4.7 01/19/2016   HGB 12.7 01/19/2016   HCT 38.4 01/19/2016   MCV 86.3 01/19/2016   PLT 191 01/19/2016     Radiographic Findings: No results found.  Impression/Plan: 1.  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 Dr. [name] met with the patient today regarding her diagnosis and options for treatment. We discussed the role of radiation in decreasing local failures in patients who undergo lumpectomy. We discussed  20 fractions of treatment over 4 weeks as an outpatient. We discussed the possible side effects including but not limited to skin redness, fatigue, permanent skin darkening, and breast swelling. This procedure has been  fully reviewed with the patient and written informed consent has been obtained. She will have her CT simulation in the near future to begin the process of radiation planning. 3. Postop seroma. She has an appointment with Dr. Brantley Stage tomorrow to have her left axilla possibly drained. She will keep [place] informed of whether or not this required aspiration. If this re-accumulates, she understands that there is a possibility that anatomic change during treatment may require re-simulation.      Carola Rhine, PAC    This document serves as a record of services personally performed by Dr. [name], MD and Shona Simpson, [facility]. It was created on their behalf by Lendon Collar, a trained medical scribe. The creation of this record is based on the scribe's personal observations and the provider's statements to them. This document has been checked and approved by the attending provider.

## 2016-03-01 NOTE — Progress Notes (Signed)
Location of Breast Cancer: Left Breast  Lower -Outer Quadrant  Seen in Breast Clinic 12/17/15  Histology per Pathology Report: Diagnosis 12/09/15: Breast, left, needle core biopsy, 3:30 o'clock- INVASIVE AND IN SITU DUCTAL CARCINOMA.- FIBROCYSTIC CHANGES  Receptor Status: ER(95%+), PR (90%+), Her2-neu (neg ratio =1.28), Ki-(15%)  Did patient present with symptoms (if so, please note symptoms) or was this found on screening mammography?: routine screening mammogram   Past/Anticipated interventions by surgeon, if any:Diagnosis 01/22/2016:Dr. Thomas [name],MD: 1. 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 - LYMPHOVASCULAR INVASION IS IDENTIFIED.- PERINEURAL INVASION IS IDENTIFIED.- INVASIVE CARCINOMA IS BROADLY LESS THAN 0.1 CM TO THE LATERAL MARGIN OF SPECIMEN #1.- DUCTAL CARCINOMA IN SITU IS BROADLY LESS THAN 0.1 CM TO THE POSTERIOR MARGIN OF SPECIMEN 1 ANDFOCALLY LESS THAN 0.1 CM TO THE LATERAL MARGIN OF SPECIMEN #1.- SEE ONCOLOGY TABLE BELOW. 2. Breast, excision, Left Medial Margin - FIBROCYSTIC CHANGES WITH USUAL DUCTAL HYPERPLASIA AND CALCIFICATIONS.- THERE IS NO EVIDENCE OF MALIGNANCY FINAL for Katrina Young, Katrina Young ([id number])Diagnosis(continued)- SEE COMMENT. 3. Breast, excision, Left Lateral Margin - FIBROCYSTIC CHANGES WITH USUAL DUCTAL HYPERPLASIA.- THERE IS NO EVIDENCE OF MALIGNANCY.- SEE COMMENT. 4. Breast, excision, Left Posterior Margin - FIBROCYSTIC CHANGES WITH USUAL DUCTAL HYPERPLASIA.- SKELETAL MUSCLE.- THERE IS NO EVIDENCE OF MALIGNANCY.- SEE COMMENT. 5. Lymph node, sentinel, biopsy, Left axillary - THERE IS NO EVIDENCE OF CARCINOMA IN 1 OF 1 LYMPH NODE (0/1). 6. Lymph node, sentinel, biopsy, Left axillary - THERE IS NO EVIDENCE OF CARCINOMA IN 1 OF 1 LYMPH NODE (0/1).  Past/Anticipated interventions by medical oncology, if any: Chemotherapy :Seen in Breast Clinic 12/17/15,   Dr. Gudena, MD,Oncotype results=23 seen 01/30/16 follow up visit;  Lymphedema issues, if any: No  Pain issues, if any: soreness in left breast  SAFETY ISSUES: No  Prior radiation? No  Pacemaker/ICD? NO  Possible current pregnancy? N/A  Is the patient on methotrexate? NO  Current Complaints / other details:  Menarche age 13, G3 P3 1st live birth age 24,HRT 10-15 years,HX Ovarian cancer age  Nephrectomy and hysterectomy, Mother father heart disease,1  Sister breast cancer, another sister colon cancer Brother deceased Leukemia,,another brother melanoma,2 brothers prostate ca,maternal niece breast cancer,    McElroy, Janice Bruner, RN 03/01/2016,11:04 AM  Allergies:Morphine & Codeine, nausea BP (!) 152/58 (BP Location: Right Arm, Patient Position: Sitting, Cuff Size: Normal)   Pulse (!) 52   Temp 97.4 F (36.3 C) (Oral)   Resp 16   Ht 5' 3.5" (1.613 m)   Wt 163 lb 9.6 oz (74.2 kg)   SpO2 100%   BMI 28.53 kg/m   Wt Readings from Last 3 Encounters:  03/01/16 163 lb 9.6 oz (74.2 kg)  01/30/16 164 lb 1.6 oz (74.4 kg)  01/22/16 165 lb (74.8 kg)   

## 2016-03-03 ENCOUNTER — Ambulatory Visit
Admission: RE | Admit: 2016-03-03 | Discharge: 2016-03-03 | Disposition: A | Discharge status: Home / Self Care | Payer: Medicare Other | Source: Ambulatory Visit | Attending: Radiation Oncology | Admitting: Radiation Oncology

## 2016-03-03 DIAGNOSIS — C50512 Malignant neoplasm of lower-outer quadrant of left female breast: Secondary | ICD-10-CM | POA: Diagnosis not present

## 2016-03-03 DIAGNOSIS — Z7982 Long term (current) use of aspirin: Secondary | ICD-10-CM | POA: Diagnosis not present

## 2016-03-03 DIAGNOSIS — Z79899 Other long term (current) drug therapy: Secondary | ICD-10-CM | POA: Diagnosis not present

## 2016-03-03 DIAGNOSIS — Z510 Encounter for antineoplastic radiation therapy: Secondary | ICD-10-CM | POA: Diagnosis not present

## 2016-03-04 NOTE — Progress Notes (Signed)
  Radiation Oncology         (336) [phone] ________________________________  Name: Katrina Young MRN: [id number]  Date: 03/03/2016  DOB: [birth year]   DIAGNOSIS:     ICD-9-CM ICD-10-CM   1. Breast cancer of lower-outer quadrant of left female breast ([place]) 174.5 C50.512     SIMULATION AND TREATMENT PLANNING NOTE  The patient presented for simulation prior to beginning her course of radiation treatment for her diagnosis of Left-sided breast cancer. The patient was placed in a supine position on a breast board. A customized vac-lock bag was constructed and this complex treatment device will be used on a daily basis during her treatment. In this fashion, a CT scan was obtained through the chest area and an isocenter was placed near the chest wall within the breast.  The patient will be planned to receive a course of radiation initially to a dose of 42.5 Gy. This will consist of a whole breast radiotherapy technique. To accomplish this, 2 customized blocks have been designed which will correspond to medial and lateral whole breast tangent fields. This treatment will be accomplished at 2.5 Gy per fraction. A forward planning technique will also be evaluated to determine if this approach improves the plan. It is anticipated that the patient will then receive a 7.5 Gy boost to the seroma cavity which has been contoured. This will be accomplished at 2.5 Gy per fraction.   This initial treatment will consist of a 3-D conformal technique. The seroma has been contoured as the primary target structure. Additionally, dose volume histograms of both this target as well as the lungs and heart will also be evaluated. Such an approach is necessary to ensure that the target area is adequately covered while the nearby critical  normal structures are adequately spared.  Plan:  The final anticipated total dose therefore will correspond to 50 Gy.   Special treatment procedure was performed today due to the extra time and  effort required by myself to plan and prepare this patient for deep inspiration breath hold technique.  I have determined cardiac sparing to be of benefit to this patient to prevent long term cardiac damage due to radiation of the heart.  Bellows were placed on the patient's abdomen. To facilitate cardiac sparing, the patient was coached by the radiation therapists on breath hold techniques and breathing practice was performed. Practice waveforms were obtained. The patient was then scanned while maintaining breath hold in the treatment position.  This image was then transferred over to the imaging specialist. The imaging specialist then created a fusion of the free breathing and breath hold scans using the chest wall as the stable structure. I personally reviewed the fusion in axial, coronal and sagittal image planes.  Excellent cardiac sparing was obtained.  I felt the patient is an appropriate candidate for breath hold and the patient will be treated as such.  The image fusion was then reviewed with the patient to reinforce the necessity of reproducible breath hold.    _______________________________   Jodelle Gross, MD, PhD

## 2016-03-04 NOTE — Progress Notes (Signed)
  Radiation Oncology         (336) [phone] ________________________________  Name: Katrina Young MRN: [id number]  Date: 03/03/2016  DOB: [birth year]  Optical Surface Tracking Plan:  Since intensity modulated radiotherapy (IMRT) and 3D conformal radiation treatment methods are predicated on accurate and precise positioning for treatment, intrafraction motion monitoring is medically necessary to ensure accurate and safe treatment delivery.  The ability to quantify intrafraction motion without excessive ionizing radiation dose can only be performed with optical surface tracking. Accordingly, surface imaging offers the opportunity to obtain 3D measurements of patient position throughout IMRT and 3D treatments without excessive radiation exposure.  I am ordering optical surface tracking for this patient's upcoming course of radiotherapy. ________________________________  Kyung Rudd, MD 03/04/2016 4:16 PM    Reference:   Particia Jasper, et al. Surface imaging-based analysis of intrafraction motion for breast radiotherapy patients.Journal of [place], n. 6, nov. 2014. ISSN [id number].   Available at: <http://www.jacmp.org/index.php/jacmp/article/view/4957>.

## 2016-03-05 DIAGNOSIS — Z7982 Long term (current) use of aspirin: Secondary | ICD-10-CM | POA: Diagnosis not present

## 2016-03-05 DIAGNOSIS — C50512 Malignant neoplasm of lower-outer quadrant of left female breast: Secondary | ICD-10-CM | POA: Diagnosis not present

## 2016-03-05 DIAGNOSIS — Z510 Encounter for antineoplastic radiation therapy: Secondary | ICD-10-CM | POA: Diagnosis not present

## 2016-03-05 DIAGNOSIS — Z79899 Other long term (current) drug therapy: Secondary | ICD-10-CM | POA: Diagnosis not present

## 2016-03-10 ENCOUNTER — Ambulatory Visit
Admission: RE | Admit: 2016-03-10 | Discharge: 2016-03-10 | Disposition: A | Discharge status: Home / Self Care | Payer: Medicare Other | Source: Ambulatory Visit | Attending: Radiation Oncology | Admitting: Radiation Oncology

## 2016-03-10 DIAGNOSIS — Z510 Encounter for antineoplastic radiation therapy: Secondary | ICD-10-CM | POA: Diagnosis not present

## 2016-03-10 DIAGNOSIS — Z7982 Long term (current) use of aspirin: Secondary | ICD-10-CM | POA: Diagnosis not present

## 2016-03-10 DIAGNOSIS — C50512 Malignant neoplasm of lower-outer quadrant of left female breast: Secondary | ICD-10-CM | POA: Diagnosis not present

## 2016-03-10 DIAGNOSIS — Z79899 Other long term (current) drug therapy: Secondary | ICD-10-CM | POA: Diagnosis not present

## 2016-03-10 MED ORDER — ALRA NON-METALLIC DEODORANT (RAD-ONC)
1.0000 "application " | Freq: Once | TOPICAL | Status: AC
  Administered 2016-03-10: 1 via TOPICAL

## 2016-03-10 MED ORDER — RADIAPLEXRX EX GEL
Freq: Once | CUTANEOUS | Status: AC
  Administered 2016-03-10: 13:00:00 via TOPICAL

## 2016-03-10 NOTE — Progress Notes (Signed)
Pt education  Breast, my business card, radiation therapy ,alra ,radiaplex gel, side effects include skin irritation, fatigue, pain, soreness/swelling breast, use of electric razor only for under arm, increase protein in diet, drink plenty fluids,water, verbal understanding, teach back given 12:54 PM

## 2016-03-11 ENCOUNTER — Ambulatory Visit
Admission: RE | Admit: 2016-03-11 | Discharge: 2016-03-11 | Disposition: A | Discharge status: Home / Self Care | Payer: Medicare Other | Source: Ambulatory Visit | Attending: Radiation Oncology | Admitting: Radiation Oncology

## 2016-03-11 DIAGNOSIS — Z510 Encounter for antineoplastic radiation therapy: Secondary | ICD-10-CM | POA: Diagnosis not present

## 2016-03-11 DIAGNOSIS — Z79899 Other long term (current) drug therapy: Secondary | ICD-10-CM | POA: Diagnosis not present

## 2016-03-11 DIAGNOSIS — C50512 Malignant neoplasm of lower-outer quadrant of left female breast: Secondary | ICD-10-CM | POA: Diagnosis not present

## 2016-03-11 DIAGNOSIS — Z7982 Long term (current) use of aspirin: Secondary | ICD-10-CM | POA: Diagnosis not present

## 2016-03-12 ENCOUNTER — Ambulatory Visit
Admission: RE | Admit: 2016-03-12 | Discharge: 2016-03-12 | Disposition: A | Discharge status: Home / Self Care | Payer: Medicare Other | Source: Ambulatory Visit | Attending: Radiation Oncology | Admitting: Radiation Oncology

## 2016-03-12 ENCOUNTER — Encounter: Payer: Self-pay | Admitting: Radiation Oncology

## 2016-03-12 VITALS — BP 152/63 | Temp 97.7°F | Ht 63.5 in | Wt 160.8 lb

## 2016-03-12 DIAGNOSIS — Z79899 Other long term (current) drug therapy: Secondary | ICD-10-CM | POA: Diagnosis not present

## 2016-03-12 DIAGNOSIS — Z510 Encounter for antineoplastic radiation therapy: Secondary | ICD-10-CM | POA: Diagnosis not present

## 2016-03-12 DIAGNOSIS — Z7982 Long term (current) use of aspirin: Secondary | ICD-10-CM | POA: Diagnosis not present

## 2016-03-12 DIAGNOSIS — C50512 Malignant neoplasm of lower-outer quadrant of left female breast: Secondary | ICD-10-CM

## 2016-03-12 NOTE — Progress Notes (Signed)
Department of Radiation Oncology  Phone:  [phone] Fax:        [phone]  Weekly Treatment Note    Name: Katrina Young Date: 03/12/2016 MRN: [id number] DOB: 1941/04/22   Diagnosis:     ICD-9-CM ICD-10-CM   1. Breast cancer of lower-outer quadrant of left female breast ([facility]) 174.5 C50.512      Current dose: 5 Gy  Current fraction: 2   MEDICATIONS: Current Outpatient Prescriptions  Medication Sig Dispense Refill  . aspirin EC 81 MG tablet Take 81 mg by mouth daily.    Marland Kitchen atenolol (TENORMIN) 50 MG tablet Take 50 mg by mouth every morning.     . Calcium-Vitamin D-Vitamin K (CALCIUM SOFT CHEWS PO) Take by mouth.    . cholecalciferol (VITAMIN D) 1000 UNITS tablet Take 1,000 Units by mouth daily.    Marland Kitchen esomeprazole (NEXIUM) 40 MG capsule Take 40 mg by mouth daily before breakfast.     . hyaluronate sodium (RADIAPLEXRX) GEL Apply 1 application topically 2 (two) times daily.    Marland Kitchen ibuprofen (ADVIL,MOTRIN) 200 MG tablet Take 200 mg by mouth every 6 (six) hours as needed.    Marland Kitchen LORazepam (ATIVAN) 1 MG tablet Take 1 mg by mouth 2 (two) times daily as needed for anxiety.     Marland Kitchen losartan (COZAAR) 100 MG tablet Take 100 mg by mouth daily.     . Misc Natural Products (OSTEO BI-FLEX/5-LOXIN ADVANCED PO) Take by mouth.    . Multiple Vitamin (MULTIVITAMIN WITH MINERALS) TABS tablet Take 1 tablet by mouth daily.    . non-metallic deodorant Jethro Poling) MISC Apply 1 application topically daily as needed.    . Omega 3 1000 MG CAPS Take 1 capsule by mouth daily.    . Phenazopyridine HCl (AZO TABS PO) Take 2 tablets by mouth daily.     . pravastatin (PRAVACHOL) 20 MG tablet Take 20 mg by mouth daily.     . vitamin B-12 (CYANOCOBALAMIN) 1000 MCG tablet Take 1,000 mcg by mouth daily.    . promethazine (PHENERGAN) 12.5 MG tablet Take 1 tablet (12.5 mg total) by mouth every 6 (six) hours as needed for nausea or vomiting. (Patient not taking: Reported on 03/01/2016) 30 tablet 0   No current  facility-administered medications for this encounter.      ALLERGIES: Codeine and Morphine and related   LABORATORY DATA:  Lab Results  Component Value Date   WBC 4.7 01/19/2016   HGB 12.7 01/19/2016   HCT 38.4 01/19/2016   MCV 86.3 01/19/2016   PLT 191 01/19/2016   Lab Results  Component Value Date   NA 141 01/19/2016   K 4.5 01/19/2016   CL 105 01/19/2016   CO2 29 01/19/2016   Lab Results  Component Value Date   ALT 25 01/19/2016   AST 28 01/19/2016   ALKPHOS 93 01/19/2016   BILITOT 0.6 01/19/2016     NARRATIVE: Katrina Young was seen today for weekly treatment management. The chart was checked and the patient's films were reviewed.  Katrina Young is here for her 2nd fraction of radiation to her Left Breast. She denies pain at this time, but does report occasional shooting pain in her Left Breast. She reports her skin is normal appearing over her radiation site, and she is using the radiaplex twice daily as directed.  BP (!) 152/63   Temp 97.7 F (36.5 C)   Ht 5' 3.5" (1.613 m)   Wt 160 lb 12.8 oz (72.9 kg)  SpO2 97% Comment: room air  BMI 28.04 kg/m    Wt Readings from Last 3 Encounters:  03/12/16 160 lb 12.8 oz (72.9 kg)  03/01/16 163 lb 9.6 oz (74.2 kg)  01/30/16 164 lb 1.6 oz (74.4 kg)    PHYSICAL EXAMINATION: height is 5' 3.5" (1.613 m) and weight is 160 lb 12.8 oz (72.9 kg). Her temperature is 97.7 F (36.5 C). Her blood pressure is 152/63 (abnormal). Her oxygen saturation is 97%.        ASSESSMENT: The patient is doing satisfactorily with treatment.  PLAN: We will continue with the patient's radiation treatment as planned.

## 2016-03-12 NOTE — Progress Notes (Signed)
Ms. Achen is here for her 2nd fraction of radiation to her Left Breast. She denies pain at this time, but does report occasional shooting pain in her Left Breast. She reports her skin is normal appearing over her radiation site, and she is using the radiaplex twice daily as directed.  BP (!) 152/63   Temp 97.7 F (36.5 C)   Ht 5' 3.5" (1.613 m)   Wt 160 lb 12.8 oz (72.9 kg)   SpO2 97% Comment: room air  BMI 28.04 kg/m    Wt Readings from Last 3 Encounters:  03/12/16 160 lb 12.8 oz (72.9 kg)  03/01/16 163 lb 9.6 oz (74.2 kg)  01/30/16 164 lb 1.6 oz (74.4 kg)

## 2016-03-15 ENCOUNTER — Ambulatory Visit
Admission: RE | Admit: 2016-03-15 | Discharge: 2016-03-15 | Disposition: A | Discharge status: Home / Self Care | Payer: Medicare Other | Source: Ambulatory Visit | Attending: Radiation Oncology | Admitting: Radiation Oncology

## 2016-03-15 DIAGNOSIS — Z7982 Long term (current) use of aspirin: Secondary | ICD-10-CM | POA: Diagnosis not present

## 2016-03-15 DIAGNOSIS — Z510 Encounter for antineoplastic radiation therapy: Secondary | ICD-10-CM | POA: Diagnosis not present

## 2016-03-15 DIAGNOSIS — Z79899 Other long term (current) drug therapy: Secondary | ICD-10-CM | POA: Diagnosis not present

## 2016-03-15 DIAGNOSIS — C50512 Malignant neoplasm of lower-outer quadrant of left female breast: Secondary | ICD-10-CM | POA: Diagnosis not present

## 2016-03-16 ENCOUNTER — Ambulatory Visit
Admission: RE | Admit: 2016-03-16 | Discharge: 2016-03-16 | Disposition: A | Discharge status: Home / Self Care | Payer: Medicare Other | Source: Ambulatory Visit | Attending: Radiation Oncology | Admitting: Radiation Oncology

## 2016-03-16 DIAGNOSIS — C50512 Malignant neoplasm of lower-outer quadrant of left female breast: Secondary | ICD-10-CM | POA: Diagnosis not present

## 2016-03-16 DIAGNOSIS — Z510 Encounter for antineoplastic radiation therapy: Secondary | ICD-10-CM | POA: Diagnosis not present

## 2016-03-16 DIAGNOSIS — Z79899 Other long term (current) drug therapy: Secondary | ICD-10-CM | POA: Diagnosis not present

## 2016-03-16 DIAGNOSIS — Z7982 Long term (current) use of aspirin: Secondary | ICD-10-CM | POA: Diagnosis not present

## 2016-03-17 ENCOUNTER — Ambulatory Visit
Admission: RE | Admit: 2016-03-17 | Discharge: 2016-03-17 | Disposition: A | Discharge status: Home / Self Care | Payer: Medicare Other | Source: Ambulatory Visit | Attending: Radiation Oncology | Admitting: Radiation Oncology

## 2016-03-17 DIAGNOSIS — Z510 Encounter for antineoplastic radiation therapy: Secondary | ICD-10-CM | POA: Diagnosis not present

## 2016-03-17 DIAGNOSIS — Z79899 Other long term (current) drug therapy: Secondary | ICD-10-CM | POA: Diagnosis not present

## 2016-03-17 DIAGNOSIS — C50512 Malignant neoplasm of lower-outer quadrant of left female breast: Secondary | ICD-10-CM | POA: Diagnosis not present

## 2016-03-17 DIAGNOSIS — Z7982 Long term (current) use of aspirin: Secondary | ICD-10-CM | POA: Diagnosis not present

## 2016-03-18 ENCOUNTER — Ambulatory Visit
Admission: RE | Admit: 2016-03-18 | Discharge: 2016-03-18 | Disposition: A | Discharge status: Home / Self Care | Payer: Medicare Other | Source: Ambulatory Visit | Attending: Radiation Oncology | Admitting: Radiation Oncology

## 2016-03-18 DIAGNOSIS — Z510 Encounter for antineoplastic radiation therapy: Secondary | ICD-10-CM | POA: Diagnosis not present

## 2016-03-18 DIAGNOSIS — Z7982 Long term (current) use of aspirin: Secondary | ICD-10-CM | POA: Diagnosis not present

## 2016-03-18 DIAGNOSIS — Z79899 Other long term (current) drug therapy: Secondary | ICD-10-CM | POA: Diagnosis not present

## 2016-03-18 DIAGNOSIS — C50512 Malignant neoplasm of lower-outer quadrant of left female breast: Secondary | ICD-10-CM | POA: Diagnosis not present

## 2016-03-19 ENCOUNTER — Ambulatory Visit
Admission: RE | Admit: 2016-03-19 | Discharge: 2016-03-19 | Disposition: A | Discharge status: Home / Self Care | Payer: Medicare Other | Source: Ambulatory Visit | Attending: Radiation Oncology | Admitting: Radiation Oncology

## 2016-03-19 ENCOUNTER — Telehealth: Payer: Self-pay | Admitting: *Deleted

## 2016-03-19 ENCOUNTER — Encounter: Payer: Self-pay | Admitting: Radiation Oncology

## 2016-03-19 VITALS — BP 144/77 | HR 56 | Temp 98.1°F | Ht 63.5 in | Wt 164.6 lb

## 2016-03-19 DIAGNOSIS — Z510 Encounter for antineoplastic radiation therapy: Secondary | ICD-10-CM | POA: Diagnosis not present

## 2016-03-19 DIAGNOSIS — Z79899 Other long term (current) drug therapy: Secondary | ICD-10-CM | POA: Diagnosis not present

## 2016-03-19 DIAGNOSIS — C50512 Malignant neoplasm of lower-outer quadrant of left female breast: Secondary | ICD-10-CM

## 2016-03-19 DIAGNOSIS — Z7982 Long term (current) use of aspirin: Secondary | ICD-10-CM | POA: Diagnosis not present

## 2016-03-19 NOTE — Progress Notes (Signed)
Katrina Young presents for her 7th fraction of radiation to her Left Breast. She reports some tenderness to her Left Axilla and underneath her Left Breast. She has some fatigue, but is still able to do normal activities with an occasional nap. Her Left Breast is red with a raised rash underneath her breast. She tells me it does not itch. She is trying to keep the area as dry as possible, and does use the Radiaplex twice daily.  BP (!) 144/77   Pulse (!) 56   Temp 98.1 F (36.7 C)   Ht 5' 3.5" (1.613 m)   Wt 164 lb 9.6 oz (74.7 kg)   SpO2 97% Comment: room air  BMI 28.70 kg/m    Wt Readings from Last 3 Encounters:  03/19/16 164 lb 9.6 oz (74.7 kg)  03/12/16 160 lb 12.8 oz (72.9 kg)  03/01/16 163 lb 9.6 oz (74.2 kg)

## 2016-03-19 NOTE — Progress Notes (Signed)
Department of Radiation Oncology  Phone:  [phone] Fax:        [phone]  Weekly Treatment Note    Name: Katrina Young Date: 03/21/2016 MRN: [id number] DOB: 1941-05-30   Diagnosis:     ICD-9-CM ICD-10-CM   1. Breast cancer of lower-outer quadrant of left female breast ([facility]) 174.5 C50.512      Current dose: 17.5 Gy  Current fraction: 7   MEDICATIONS: Current Outpatient Prescriptions  Medication Sig Dispense Refill  . aspirin EC 81 MG tablet Take 81 mg by mouth daily.    Marland Kitchen atenolol (TENORMIN) 50 MG tablet Take 50 mg by mouth every morning.     . Calcium-Vitamin D-Vitamin K (CALCIUM SOFT CHEWS PO) Take by mouth.    . cholecalciferol (VITAMIN D) 1000 UNITS tablet Take 1,000 Units by mouth daily.    Marland Kitchen esomeprazole (NEXIUM) 40 MG capsule Take 40 mg by mouth daily before breakfast.     . hyaluronate sodium (RADIAPLEXRX) GEL Apply 1 application topically 2 (two) times daily.    Marland Kitchen ibuprofen (ADVIL,MOTRIN) 200 MG tablet Take 200 mg by mouth every 6 (six) hours as needed.    Marland Kitchen LORazepam (ATIVAN) 1 MG tablet Take 1 mg by mouth 2 (two) times daily as needed for anxiety.     Marland Kitchen losartan (COZAAR) 100 MG tablet Take 100 mg by mouth daily.     . Misc Natural Products (OSTEO BI-FLEX/5-LOXIN ADVANCED PO) Take by mouth.    . Multiple Vitamin (MULTIVITAMIN WITH MINERALS) TABS tablet Take 1 tablet by mouth daily.    . non-metallic deodorant Jethro Poling) MISC Apply 1 application topically daily as needed.    . Omega 3 1000 MG CAPS Take 1 capsule by mouth daily.    . Phenazopyridine HCl (AZO TABS PO) Take 2 tablets by mouth daily.     . pravastatin (PRAVACHOL) 20 MG tablet Take 20 mg by mouth daily.     . promethazine (PHENERGAN) 12.5 MG tablet Take 1 tablet (12.5 mg total) by mouth every 6 (six) hours as needed for nausea or vomiting. 30 tablet 0  . vitamin B-12 (CYANOCOBALAMIN) 1000 MCG tablet Take 1,000 mcg by mouth daily.     No current facility-administered medications for this encounter.       ALLERGIES: Codeine and Morphine and related   LABORATORY DATA:  Lab Results  Component Value Date   WBC 4.7 01/19/2016   HGB 12.7 01/19/2016   HCT 38.4 01/19/2016   MCV 86.3 01/19/2016   PLT 191 01/19/2016   Lab Results  Component Value Date   NA 141 01/19/2016   K 4.5 01/19/2016   CL 105 01/19/2016   CO2 29 01/19/2016   Lab Results  Component Value Date   ALT 25 01/19/2016   AST 28 01/19/2016   ALKPHOS 93 01/19/2016   BILITOT 0.6 01/19/2016     NARRATIVE: Katrina Young was seen today for weekly treatment management. The chart was checked and the patient's films were reviewed.  Katrina Young presents for her 7th fraction of radiation to her Left Breast. She reports some tenderness to her Left Axilla and underneath her Left Breast. She has some fatigue, but is still able to do normal activities with an occasional nap. She describes bumps on her left breast. She denies pruritus. She is trying to keep the area as dry as possible and does use the Radiaplex twice daily.  BP (!) 144/77   Pulse (!) 56   Temp 98.1 F (36.7 C)  Ht 5' 3.5" (1.613 m)   Wt 164 lb 9.6 oz (74.7 kg)   SpO2 97% Comment: room air  BMI 28.70 kg/m    Wt Readings from Last 3 Encounters:  03/19/16 164 lb 9.6 oz (74.7 kg)  03/12/16 160 lb 12.8 oz (72.9 kg)  03/01/16 163 lb 9.6 oz (74.2 kg)    PHYSICAL EXAMINATION: height is 5' 3.5" (1.613 m) and weight is 164 lb 9.6 oz (74.7 kg). Her temperature is 98.1 F (36.7 C). Her blood pressure is 144/77 (abnormal) and her pulse is 56 (abnormal). Her oxygen saturation is 97%.   Rash emerging in the inframammary region. Otherwise minimal erythema diffusely.  ASSESSMENT: The patient is doing satisfactorily with treatment. She is doing well. She is going to keep the area dry and not wear a bra when possible.  PLAN: We will continue with the patient's radiation treatment as planned.   This document serves as a record of services personally performed by Kyung Rudd, MD. It was created on his behalf by Darcus Austin, a trained medical scribe. The creation of this record is based on the scribe's personal observations and the provider's statements to them. This document has been checked and approved by the attending provider.

## 2016-03-19 NOTE — Telephone Encounter (Signed)
  Oncology Nurse Navigator Documentation    Navigator Encounter Type: Telephone (03/19/16 1300) Telephone: Outgoing Call;Patient Update (03/19/16 1300)     Surgery Date: 01/22/16 (03/19/16 1300) Treatment Initiated Date: 01/22/16 (03/19/16 1300) Patient Visit Type: [id number] (03/19/16 1300) Treatment Phase: First Radiation Tx (03/19/16 1300) Barriers/Navigation Needs: No barriers at this time;No Questions;No Needs (03/19/16 1300)   Interventions: None required (03/19/16 1300)            Acuity: Level 1 (03/19/16 1300)         Time Spent with Patient: 15 (03/19/16 1300)

## 2016-03-22 ENCOUNTER — Ambulatory Visit
Admission: RE | Admit: 2016-03-22 | Discharge: 2016-03-22 | Disposition: A | Discharge status: Home / Self Care | Payer: Medicare Other | Source: Ambulatory Visit | Attending: Radiation Oncology | Admitting: Radiation Oncology

## 2016-03-22 DIAGNOSIS — Z510 Encounter for antineoplastic radiation therapy: Secondary | ICD-10-CM | POA: Diagnosis not present

## 2016-03-22 DIAGNOSIS — Z7982 Long term (current) use of aspirin: Secondary | ICD-10-CM | POA: Diagnosis not present

## 2016-03-22 DIAGNOSIS — Z79899 Other long term (current) drug therapy: Secondary | ICD-10-CM | POA: Diagnosis not present

## 2016-03-22 DIAGNOSIS — C50512 Malignant neoplasm of lower-outer quadrant of left female breast: Secondary | ICD-10-CM | POA: Diagnosis not present

## 2016-03-23 ENCOUNTER — Ambulatory Visit
Admission: RE | Admit: 2016-03-23 | Discharge: 2016-03-23 | Disposition: A | Discharge status: Home / Self Care | Payer: Medicare Other | Source: Ambulatory Visit | Attending: Radiation Oncology | Admitting: Radiation Oncology

## 2016-03-23 DIAGNOSIS — Z79899 Other long term (current) drug therapy: Secondary | ICD-10-CM | POA: Diagnosis not present

## 2016-03-23 DIAGNOSIS — C50512 Malignant neoplasm of lower-outer quadrant of left female breast: Secondary | ICD-10-CM | POA: Diagnosis not present

## 2016-03-23 DIAGNOSIS — Z7982 Long term (current) use of aspirin: Secondary | ICD-10-CM | POA: Diagnosis not present

## 2016-03-23 DIAGNOSIS — Z510 Encounter for antineoplastic radiation therapy: Secondary | ICD-10-CM | POA: Diagnosis not present

## 2016-03-24 ENCOUNTER — Ambulatory Visit: Payer: Medicare Other

## 2016-03-25 ENCOUNTER — Encounter: Payer: Self-pay | Admitting: *Deleted

## 2016-03-25 ENCOUNTER — Ambulatory Visit
Admission: RE | Admit: 2016-03-25 | Discharge: 2016-03-25 | Disposition: A | Discharge status: Home / Self Care | Payer: Medicare Other | Source: Ambulatory Visit | Attending: Radiation Oncology | Admitting: Radiation Oncology

## 2016-03-25 DIAGNOSIS — Z510 Encounter for antineoplastic radiation therapy: Secondary | ICD-10-CM | POA: Diagnosis not present

## 2016-03-25 DIAGNOSIS — Z79899 Other long term (current) drug therapy: Secondary | ICD-10-CM | POA: Diagnosis not present

## 2016-03-25 DIAGNOSIS — C50512 Malignant neoplasm of lower-outer quadrant of left female breast: Secondary | ICD-10-CM | POA: Diagnosis not present

## 2016-03-25 DIAGNOSIS — Z7982 Long term (current) use of aspirin: Secondary | ICD-10-CM | POA: Diagnosis not present

## 2016-03-25 NOTE — Progress Notes (Signed)
Called Ms. Katrina Young 3 times last PM and this AM without any response.  Left message regarding potential exposure to the flu

## 2016-03-25 NOTE — Progress Notes (Signed)
Called and left 3 messages last PM to relay to Ms. Katrina Young that she may have had exposure by a staff member who has been diagnosed with the flu. Therefore, as a precaution,our Infection prevention staff have advised patients to take Tamiflu 75 mg po daily x 7 days. She called this am and has been informed that her script was called into [facility] on [address]. She will pick up today. Informed to keep her receipt since working with the system to reimburse the cost of Tamiflu.

## 2016-03-26 ENCOUNTER — Encounter: Payer: Self-pay | Admitting: Radiation Oncology

## 2016-03-26 ENCOUNTER — Ambulatory Visit
Admission: RE | Admit: 2016-03-26 | Discharge: 2016-03-26 | Disposition: A | Discharge status: Home / Self Care | Payer: Medicare Other | Source: Ambulatory Visit | Attending: Radiation Oncology | Admitting: Radiation Oncology

## 2016-03-26 VITALS — BP 158/67 | HR 55 | Temp 98.1°F | Resp 16 | Wt 161.2 lb

## 2016-03-26 DIAGNOSIS — Z79899 Other long term (current) drug therapy: Secondary | ICD-10-CM | POA: Diagnosis not present

## 2016-03-26 DIAGNOSIS — Z7982 Long term (current) use of aspirin: Secondary | ICD-10-CM | POA: Diagnosis not present

## 2016-03-26 DIAGNOSIS — Z510 Encounter for antineoplastic radiation therapy: Secondary | ICD-10-CM | POA: Diagnosis not present

## 2016-03-26 DIAGNOSIS — C50512 Malignant neoplasm of lower-outer quadrant of left female breast: Secondary | ICD-10-CM

## 2016-03-26 NOTE — Progress Notes (Signed)
Department of Radiation Oncology  Phone:  [phone] Fax:        [phone]  Weekly Treatment Note    Name: Katrina Young Date: 03/26/2016 MRN: [id number] DOB: [birth year]   Diagnosis:     ICD-9-CM ICD-10-CM   1. Breast cancer of lower-outer quadrant of left female breast ([facility]) 174.5 C50.512      Current dose: 27.5 Gy  Current fraction: 11   MEDICATIONS: Current Outpatient Prescriptions  Medication Sig Dispense Refill  . aspirin EC 81 MG tablet Take 81 mg by mouth daily.    Marland Kitchen atenolol (TENORMIN) 50 MG tablet Take 50 mg by mouth every morning.     . Calcium-Vitamin D-Vitamin K (CALCIUM SOFT CHEWS PO) Take by mouth.    . cholecalciferol (VITAMIN D) 1000 UNITS tablet Take 1,000 Units by mouth daily.    Marland Kitchen esomeprazole (NEXIUM) 40 MG capsule Take 40 mg by mouth daily before breakfast.     . hyaluronate sodium (RADIAPLEXRX) GEL Apply 1 application topically 2 (two) times daily.    Marland Kitchen ibuprofen (ADVIL,MOTRIN) 200 MG tablet Take 200 mg by mouth every 6 (six) hours as needed.    Marland Kitchen LORazepam (ATIVAN) 1 MG tablet Take 1 mg by mouth 2 (two) times daily as needed for anxiety.     Marland Kitchen losartan (COZAAR) 100 MG tablet Take 100 mg by mouth daily.     . Misc Natural Products (OSTEO BI-FLEX/5-LOXIN ADVANCED PO) Take by mouth.    . Multiple Vitamin (MULTIVITAMIN WITH MINERALS) TABS tablet Take 1 tablet by mouth daily.    . non-metallic deodorant Jethro Poling) MISC Apply 1 application topically daily as needed.    . Omega 3 1000 MG CAPS Take 1 capsule by mouth daily.    . Phenazopyridine HCl (AZO TABS PO) Take 2 tablets by mouth daily.     . pravastatin (PRAVACHOL) 20 MG tablet Take 20 mg by mouth daily.     . promethazine (PHENERGAN) 12.5 MG tablet Take 1 tablet (12.5 mg total) by mouth every 6 (six) hours as needed for nausea or vomiting. 30 tablet 0  . vitamin B-12 (CYANOCOBALAMIN) 1000 MCG tablet Take 1,000 mcg by mouth daily.     No current facility-administered medications for this encounter.       ALLERGIES: Codeine and Morphine and related   LABORATORY DATA:  Lab Results  Component Value Date   WBC 4.7 01/19/2016   HGB 12.7 01/19/2016   HCT 38.4 01/19/2016   MCV 86.3 01/19/2016   PLT 191 01/19/2016   Lab Results  Component Value Date   NA 141 01/19/2016   K 4.5 01/19/2016   CL 105 01/19/2016   CO2 29 01/19/2016   Lab Results  Component Value Date   ALT 25 01/19/2016   AST 28 01/19/2016   ALKPHOS 93 01/19/2016   BILITOT 0.6 01/19/2016     NARRATIVE: Katrina Young was seen today for weekly treatment management. The chart was checked and the patient's films were reviewed.  Weekly rad txs left breast 11/20 completed, under inframmary fold dermatitis but improved, radiaplex bid using, patient took tamiflu yesterday and after 15 minutes had hot pain in chest  For 15 minutes, then felt better woke up this am dizzy light headed, starting to feel better now, doesn't want top take any more tamiflu, appetite fair, no pain in breast or itching 9:52 AM BP (!) 158/67 (BP Location: Right Arm, Patient Position: Sitting, Cuff Size: Normal)   Pulse (!) 55   Temp  98.1 F (36.7 C) (Oral)   Resp 16   Wt 161 lb 3.2 oz (73.1 kg)   BMI 28.11 kg/m   Wt Readings from Last 3 Encounters:  03/26/16 161 lb 3.2 oz (73.1 kg)  03/19/16 164 lb 9.6 oz (74.7 kg)  03/12/16 160 lb 12.8 oz (72.9 kg)    PHYSICAL EXAMINATION: weight is 161 lb 3.2 oz (73.1 kg). Her oral temperature is 98.1 F (36.7 C). Her blood pressure is 158/67 (abnormal) and her pulse is 55 (abnormal). Her respiration is 16.      Mild dermatitis/erythema  ASSESSMENT: The patient is doing satisfactorily with treatment.  PLAN: We will continue with the patient's radiation treatment as planned. The patient is going to stop Tamiflu. She describes significant side effects and she states that she is not able to continue this. He describes significant dizziness and burning in her chest.

## 2016-03-26 NOTE — Progress Notes (Signed)
Weekly rad txs left breast 11/20 completed, under inframmary fold dermatitis but improved, radiaplex bid using, patient took tamiflu yesterday and after 15 minutes had hot pain in chest  For 15 minutes, then felt better woke up this am dizzy light headed, starting to feel better now, doesn't want top take any more tamiflu, appetite fair, no pain in breast or itching 9:42 AM BP (!) 158/67 (BP Location: Right Arm, Patient Position: Sitting, Cuff Size: Normal)   Pulse (!) 55   Temp 98.1 F (36.7 C) (Oral)   Resp 16   Wt 161 lb 3.2 oz (73.1 kg)   BMI 28.11 kg/m   Wt Readings from Last 3 Encounters:  03/26/16 161 lb 3.2 oz (73.1 kg)  03/19/16 164 lb 9.6 oz (74.7 kg)  03/12/16 160 lb 12.8 oz (72.9 kg)

## 2016-03-30 ENCOUNTER — Ambulatory Visit
Admission: RE | Admit: 2016-03-30 | Discharge: 2016-03-30 | Disposition: A | Discharge status: Home / Self Care | Payer: Medicare Other | Source: Ambulatory Visit | Attending: Radiation Oncology | Admitting: Radiation Oncology

## 2016-03-30 DIAGNOSIS — Z79899 Other long term (current) drug therapy: Secondary | ICD-10-CM | POA: Diagnosis not present

## 2016-03-30 DIAGNOSIS — C50512 Malignant neoplasm of lower-outer quadrant of left female breast: Secondary | ICD-10-CM | POA: Diagnosis not present

## 2016-03-30 DIAGNOSIS — Z7982 Long term (current) use of aspirin: Secondary | ICD-10-CM | POA: Diagnosis not present

## 2016-03-30 DIAGNOSIS — Z510 Encounter for antineoplastic radiation therapy: Secondary | ICD-10-CM | POA: Diagnosis not present

## 2016-03-31 ENCOUNTER — Ambulatory Visit
Admission: RE | Admit: 2016-03-31 | Discharge: 2016-03-31 | Disposition: A | Discharge status: Home / Self Care | Payer: Medicare Other | Source: Ambulatory Visit | Attending: Radiation Oncology | Admitting: Radiation Oncology

## 2016-03-31 ENCOUNTER — Encounter: Payer: Self-pay | Admitting: Radiation Oncology

## 2016-03-31 DIAGNOSIS — Z79899 Other long term (current) drug therapy: Secondary | ICD-10-CM | POA: Diagnosis not present

## 2016-03-31 DIAGNOSIS — Z510 Encounter for antineoplastic radiation therapy: Secondary | ICD-10-CM | POA: Diagnosis not present

## 2016-03-31 DIAGNOSIS — Z7982 Long term (current) use of aspirin: Secondary | ICD-10-CM | POA: Diagnosis not present

## 2016-03-31 DIAGNOSIS — C50512 Malignant neoplasm of lower-outer quadrant of left female breast: Secondary | ICD-10-CM | POA: Diagnosis not present

## 2016-03-31 NOTE — Progress Notes (Signed)
Weekly radiation treatments left breast,13/17 completed,  dermatitis on breat and under inframmary fold, blister is forming, no pain, or itching , using radiaplex bid, appetite good, refused to take any more tamiflu,  Stated, gave receipt to get reimbursed,  BP (!) 137/55 (BP Location: Right Arm, Patient Position: Sitting, Cuff Size: Normal)   Pulse (!) 48 Comment: manual count 1 minute  Temp 97.7 F (36.5 C) (Oral)   Resp 16   Wt 161 lb 14.4 oz (73.4 kg)   BMI 28.23 kg/m  10:06 AM Wt Readings from Last 3 Encounters:  03/31/16 161 lb 14.4 oz (73.4 kg)  03/26/16 161 lb 3.2 oz (73.1 kg)  03/19/16 164 lb 9.6 oz (74.7 kg)

## 2016-04-01 ENCOUNTER — Ambulatory Visit
Admission: RE | Admit: 2016-04-01 | Discharge: 2016-04-01 | Disposition: A | Discharge status: Home / Self Care | Payer: Medicare Other | Source: Ambulatory Visit | Attending: Radiation Oncology | Admitting: Radiation Oncology

## 2016-04-01 DIAGNOSIS — C50512 Malignant neoplasm of lower-outer quadrant of left female breast: Secondary | ICD-10-CM | POA: Diagnosis not present

## 2016-04-01 DIAGNOSIS — Z79899 Other long term (current) drug therapy: Secondary | ICD-10-CM | POA: Diagnosis not present

## 2016-04-01 DIAGNOSIS — Z7982 Long term (current) use of aspirin: Secondary | ICD-10-CM | POA: Diagnosis not present

## 2016-04-01 DIAGNOSIS — Z510 Encounter for antineoplastic radiation therapy: Secondary | ICD-10-CM | POA: Diagnosis not present

## 2016-04-02 ENCOUNTER — Ambulatory Visit: Payer: Medicare Other | Admitting: Radiation Oncology

## 2016-04-02 ENCOUNTER — Ambulatory Visit
Admission: RE | Admit: 2016-04-02 | Discharge: 2016-04-02 | Disposition: A | Discharge status: Home / Self Care | Payer: Medicare Other | Source: Ambulatory Visit | Attending: Radiation Oncology | Admitting: Radiation Oncology

## 2016-04-02 ENCOUNTER — Ambulatory Visit: Payer: Medicare Other

## 2016-04-02 DIAGNOSIS — Z7982 Long term (current) use of aspirin: Secondary | ICD-10-CM | POA: Diagnosis not present

## 2016-04-02 DIAGNOSIS — Z510 Encounter for antineoplastic radiation therapy: Secondary | ICD-10-CM | POA: Diagnosis not present

## 2016-04-02 DIAGNOSIS — Z79899 Other long term (current) drug therapy: Secondary | ICD-10-CM | POA: Diagnosis not present

## 2016-04-02 DIAGNOSIS — C50512 Malignant neoplasm of lower-outer quadrant of left female breast: Secondary | ICD-10-CM | POA: Diagnosis not present

## 2016-04-05 ENCOUNTER — Ambulatory Visit
Admission: RE | Admit: 2016-04-05 | Discharge: 2016-04-05 | Disposition: A | Discharge status: Home / Self Care | Payer: Medicare Other | Source: Ambulatory Visit | Attending: Radiation Oncology | Admitting: Radiation Oncology

## 2016-04-05 DIAGNOSIS — Z7982 Long term (current) use of aspirin: Secondary | ICD-10-CM | POA: Diagnosis not present

## 2016-04-05 DIAGNOSIS — Z79899 Other long term (current) drug therapy: Secondary | ICD-10-CM | POA: Diagnosis not present

## 2016-04-05 DIAGNOSIS — Z510 Encounter for antineoplastic radiation therapy: Secondary | ICD-10-CM | POA: Diagnosis not present

## 2016-04-05 DIAGNOSIS — C50512 Malignant neoplasm of lower-outer quadrant of left female breast: Secondary | ICD-10-CM | POA: Diagnosis not present

## 2016-04-06 ENCOUNTER — Ambulatory Visit: Payer: Medicare Other

## 2016-04-06 ENCOUNTER — Ambulatory Visit
Admission: RE | Admit: 2016-04-06 | Discharge: 2016-04-06 | Disposition: A | Discharge status: Home / Self Care | Payer: Medicare Other | Source: Ambulatory Visit | Attending: Radiation Oncology | Admitting: Radiation Oncology

## 2016-04-06 DIAGNOSIS — Z79899 Other long term (current) drug therapy: Secondary | ICD-10-CM | POA: Diagnosis not present

## 2016-04-06 DIAGNOSIS — Z7982 Long term (current) use of aspirin: Secondary | ICD-10-CM | POA: Diagnosis not present

## 2016-04-06 DIAGNOSIS — C50512 Malignant neoplasm of lower-outer quadrant of left female breast: Secondary | ICD-10-CM | POA: Diagnosis not present

## 2016-04-06 DIAGNOSIS — Z510 Encounter for antineoplastic radiation therapy: Secondary | ICD-10-CM | POA: Diagnosis not present

## 2016-04-07 ENCOUNTER — Encounter: Payer: Self-pay | Admitting: Radiation Oncology

## 2016-04-07 ENCOUNTER — Ambulatory Visit
Admission: RE | Admit: 2016-04-07 | Discharge: 2016-04-07 | Disposition: A | Discharge status: Home / Self Care | Payer: Medicare Other | Source: Ambulatory Visit | Attending: Radiation Oncology | Admitting: Radiation Oncology

## 2016-04-07 ENCOUNTER — Ambulatory Visit: Payer: Medicare Other

## 2016-04-07 VITALS — BP 145/68 | HR 50 | Temp 97.6°F | Resp 18 | Ht 63.5 in | Wt 162.7 lb

## 2016-04-07 DIAGNOSIS — Z510 Encounter for antineoplastic radiation therapy: Secondary | ICD-10-CM | POA: Diagnosis not present

## 2016-04-07 DIAGNOSIS — C50512 Malignant neoplasm of lower-outer quadrant of left female breast: Secondary | ICD-10-CM

## 2016-04-07 DIAGNOSIS — Z79899 Other long term (current) drug therapy: Secondary | ICD-10-CM | POA: Diagnosis not present

## 2016-04-07 DIAGNOSIS — Z7982 Long term (current) use of aspirin: Secondary | ICD-10-CM | POA: Diagnosis not present

## 2016-04-07 MED ORDER — SONAFINE EX EMUL
1.0000 "application " | Freq: Two times a day (BID) | CUTANEOUS | Status: DC
  Administered 2016-04-07: 1 via TOPICAL

## 2016-04-07 NOTE — Progress Notes (Signed)
Complex simulation note  Diagnosis: Left-sided breast cancer  Narrative The patient has initially been planned to receive a course of whole breast radiation to a dose of 42.5 Gy in 17 fractions. The patient will now receive an additional boost to the seroma cavity which has been contoured. This will correspond to a boost of 7.5 Gy at 2.5 Gy per fraction. To accomplish this, an additional 3 customized blocks have been designed for this purpose. A complex isodose plan is requested to ensure that the target area is adequately covered with radiation dose and that the nearby normal structures such as the lung are adequately spared. The patient's final total dose will be 50 Gy.  ------------------------------------------------  Katrina Brose S. Elvis Boot, MD, PhD 

## 2016-04-07 NOTE — Progress Notes (Addendum)
Weekly radiation treatments left breast,18/17 completed,  dermatitis on breat and under inframmary fold, blister is forming, no pain.  Having some over the past week itching to dermatitis area on upper breast and chest area , using radiaplex bid.  Appetite is good.  Reports she is having fatigue in the afternoons.  EOT instructions discussed for skin to continue Radiaplex gel two more weeks, then purchase a lotion with vitamin E.  One month card given to come back to see Shona Simpson, P.A. For a follow up visit.  Changed to Sonafine today given with instructions. Wt Readings from Last 3 Encounters:  04/07/16 162 lb 11.2 oz (73.8 kg)  03/31/16 161 lb 14.4 oz (73.4 kg)  03/26/16 161 lb 3.2 oz (73.1 kg)  BP (!) 145/68 (BP Location: Right Arm, Patient Position: Sitting, Cuff Size: Normal)   Pulse (!) 50   Temp 97.6 F (36.4 C) (Oral)   Resp 18   Ht 5' 3.5" (1.613 m)   Wt 162 lb 11.2 oz (73.8 kg)   BMI 28.37 kg/m

## 2016-04-07 NOTE — Progress Notes (Signed)
Department of Radiation Oncology  Phone:  [phone] Fax:        [phone]  Weekly Treatment Note    Name: Katrina Young Date: 04/07/2016 MRN: [id number] DOB: Jan 09, 1941   Diagnosis:     ICD-9-CM ICD-10-CM   1. Breast cancer of lower-outer quadrant of left female breast (HCC) 174.5 C50.512 SONAFINE emulsion 1 application     Current dose: 45 Gy  Current fraction: 18   MEDICATIONS: Current Outpatient Prescriptions  Medication Sig Dispense Refill  . aspirin EC 81 MG tablet Take 81 mg by mouth daily.    Marland Kitchen atenolol (TENORMIN) 50 MG tablet Take 50 mg by mouth every morning.     . Calcium-Vitamin D-Vitamin K (CALCIUM SOFT CHEWS PO) Take by mouth.    . cholecalciferol (VITAMIN D) 1000 UNITS tablet Take 1,000 Units by mouth daily.    Marland Kitchen esomeprazole (NEXIUM) 40 MG capsule Take 40 mg by mouth daily before breakfast.     . hyaluronate sodium (RADIAPLEXRX) GEL Apply 1 application topically 2 (two) times daily.    Marland Kitchen LORazepam (ATIVAN) 1 MG tablet Take 1 mg by mouth 2 (two) times daily as needed for anxiety.     Marland Kitchen losartan (COZAAR) 100 MG tablet Take 100 mg by mouth daily.     . Misc Natural Products (OSTEO BI-FLEX/5-LOXIN ADVANCED PO) Take by mouth.    . Multiple Vitamin (MULTIVITAMIN WITH MINERALS) TABS tablet Take 1 tablet by mouth daily.    . non-metallic deodorant Jethro Poling) MISC Apply 1 application topically daily as needed.    . Omega 3 1000 MG CAPS Take 1 capsule by mouth daily.    . promethazine (PHENERGAN) 12.5 MG tablet Take 1 tablet (12.5 mg total) by mouth every 6 (six) hours as needed for nausea or vomiting. 30 tablet 0  . vitamin B-12 (CYANOCOBALAMIN) 1000 MCG tablet Take 1,000 mcg by mouth daily.    Marland Kitchen ibuprofen (ADVIL,MOTRIN) 200 MG tablet Take 200 mg by mouth every 6 (six) hours as needed.    . Phenazopyridine HCl (AZO TABS PO) Take 2 tablets by mouth daily.     . pravastatin (PRAVACHOL) 20 MG tablet Take 20 mg by mouth daily.      Current Facility-Administered  Medications  Medication Dose Route Frequency Provider Last Rate Last Dose  . SONAFINE emulsion 1 application  1 application Topical BID Kyung Rudd, MD   1 application at [id number]     ALLERGIES: Codeine and Morphine and related   LABORATORY DATA:  Lab Results  Component Value Date   WBC 4.7 01/19/2016   HGB 12.7 01/19/2016   HCT 38.4 01/19/2016   MCV 86.3 01/19/2016   PLT 191 01/19/2016   Lab Results  Component Value Date   NA 141 01/19/2016   K 4.5 01/19/2016   CL 105 01/19/2016   CO2 29 01/19/2016   Lab Results  Component Value Date   ALT 25 01/19/2016   AST 28 01/19/2016   ALKPHOS 93 01/19/2016   BILITOT 0.6 01/19/2016     NARRATIVE: Katrina Young was seen today for weekly treatment management. The chart was checked and the patient's films were reviewed.  Weekly radiation treatments left breast,18/17 completed,  dermatitis on breat and under inframmary fold, blister is forming, no pain.  Having some over the past week itching to dermatitis area on upper breast and chest area , using radiaplex bid.  Appetite is good.  Reports she is having fatigue in the afternoons.  EOT instructions discussed  for skin to continue Radiaplex gel two more weeks, then purchase a lotion with vitamin E.  One month card given to come back to see Shona Simpson, P.A. For a follow up visit.  Changed to Sonafine today given with instructions. Wt Readings from Last 3 Encounters:  04/07/16 162 lb 11.2 oz (73.8 kg)  03/31/16 161 lb 14.4 oz (73.4 kg)  03/26/16 161 lb 3.2 oz (73.1 kg)  BP (!) 145/68 (BP Location: Right Arm, Patient Position: Sitting, Cuff Size: Normal)   Pulse (!) 50   Temp 97.6 F (36.4 C) (Oral)   Resp 18   Ht 5' 3.5" (1.613 m)   Wt 162 lb 11.2 oz (73.8 kg)   BMI 28.37 kg/m   PHYSICAL EXAMINATION: height is 5' 3.5" (1.613 m) and weight is 162 lb 11.2 oz (73.8 kg). Her oral temperature is 97.6 F (36.4 C). Her blood pressure is 145/68 (abnormal) and her pulse is 50  (abnormal). Her respiration is 18.        ASSESSMENT: The patient is doing satisfactorily with treatment.  PLAN: We will continue with the patient's radiation treatment as planned. The patient will follow-up in our clinic in 1 month.

## 2016-04-08 ENCOUNTER — Ambulatory Visit: Payer: Medicare Other

## 2016-04-08 DIAGNOSIS — Z510 Encounter for antineoplastic radiation therapy: Secondary | ICD-10-CM | POA: Diagnosis not present

## 2016-04-08 DIAGNOSIS — C50512 Malignant neoplasm of lower-outer quadrant of left female breast: Secondary | ICD-10-CM | POA: Diagnosis not present

## 2016-04-08 DIAGNOSIS — Z7982 Long term (current) use of aspirin: Secondary | ICD-10-CM | POA: Diagnosis not present

## 2016-04-08 DIAGNOSIS — Z79899 Other long term (current) drug therapy: Secondary | ICD-10-CM | POA: Diagnosis not present

## 2016-04-09 ENCOUNTER — Other Ambulatory Visit: Payer: Self-pay | Admitting: *Deleted

## 2016-04-09 ENCOUNTER — Encounter: Payer: Self-pay | Admitting: Radiation Oncology

## 2016-04-09 ENCOUNTER — Telehealth: Payer: Self-pay | Admitting: *Deleted

## 2016-04-09 ENCOUNTER — Ambulatory Visit: Payer: Medicare Other

## 2016-04-09 DIAGNOSIS — Z79899 Other long term (current) drug therapy: Secondary | ICD-10-CM | POA: Diagnosis not present

## 2016-04-09 DIAGNOSIS — Z7982 Long term (current) use of aspirin: Secondary | ICD-10-CM | POA: Diagnosis not present

## 2016-04-09 DIAGNOSIS — C50512 Malignant neoplasm of lower-outer quadrant of left female breast: Secondary | ICD-10-CM | POA: Diagnosis not present

## 2016-04-09 DIAGNOSIS — Z510 Encounter for antineoplastic radiation therapy: Secondary | ICD-10-CM | POA: Diagnosis not present

## 2016-04-09 NOTE — Telephone Encounter (Signed)
  Oncology Nurse Navigator Documentation  Navigator Location: [facility] (04/09/16 1500) Navigator Encounter Type: Telephone (04/09/16 1500) Telephone: Outgoing Call (04/09/16 1500) Abnormal Finding Date: 12/03/15 (04/09/16 1500) Confirmed Diagnosis Date: 12/09/15 (04/09/16 1500)     Patient Visit Type: RadOnc (04/09/16 1500) Treatment Phase: Final Radiation Tx (04/09/16 1500)                            Time Spent with Patient: 15 (04/09/16 1500)

## 2016-05-02 NOTE — Progress Notes (Signed)
  Radiation Oncology         [phone] ________________________________  Name: Katrina Young MRN: [id number]  Date: 04/09/2016  DOB: 23-Oct-1940  End of Treatment Note  Diagnosis:   Left-sided breast cancer     Indication for treatment:  Curative       Radiation treatment dates:   03/11/2016 through 04/09/2016  Site/dose:   The patient initially received a dose of 42.5 Gy in 17 fractions to the breast using whole-breast tangent fields. This was delivered using a 3-D conformal technique. The patient then received a boost to the seroma. This delivered an additional 7.5 Gy in 3 fractions using a 3 field photon technique due to the depth of the seroma. The total dose was 50 Gy.  Narrative: The patient tolerated radiation treatment relatively well.   The patient had some expected skin irritation as she progressed during treatment. Moist desquamation was not present at the end of treatment.  Plan: The patient has completed radiation treatment. The patient will return to radiation oncology clinic for routine followup in one month. I advised the patient to call or return sooner if they have any questions or concerns related to their recovery or treatment. ________________________________  Jodelle Gross, M.D., Ph.D.

## 2016-05-06 ENCOUNTER — Encounter: Payer: Self-pay | Admitting: Hematology and Oncology

## 2016-05-06 ENCOUNTER — Ambulatory Visit (HOSPITAL_BASED_OUTPATIENT_CLINIC_OR_DEPARTMENT_OTHER): Payer: Medicare Other | Admitting: Hematology and Oncology

## 2016-05-06 VITALS — BP 156/58 | HR 54 | Temp 97.6°F | Resp 18 | Ht 63.5 in | Wt 162.4 lb

## 2016-05-06 DIAGNOSIS — C50512 Malignant neoplasm of lower-outer quadrant of left female breast: Secondary | ICD-10-CM | POA: Diagnosis not present

## 2016-05-06 DIAGNOSIS — Z23 Encounter for immunization: Secondary | ICD-10-CM | POA: Diagnosis not present

## 2016-05-06 DIAGNOSIS — Z170 Estrogen receptor positive status [ER+]: Secondary | ICD-10-CM | POA: Diagnosis not present

## 2016-05-06 MED ORDER — ANASTROZOLE 1 MG PO TABS: 1.0000 mg | ORAL_TABLET | Freq: Every day | ORAL | 3 refills | Status: DC

## 2016-05-06 MED ORDER — INFLUENZA VAC SPLIT QUAD 0.5 ML IM SUSY
0.5000 mL | PREFILLED_SYRINGE | Freq: Once | INTRAMUSCULAR | Status: AC
  Administered 2016-05-06: 0.5 mL via INTRAMUSCULAR
  Filled 2016-05-06: qty 0.5

## 2016-05-06 NOTE — Assessment & Plan Note (Signed)
Left lumpectomy 01/22/2016: IDC grade 1, 2.1 cm, with intermediate grade DCIS, LVI present, PNI present, 0/2 lymph nodes negative, ER 95%, PR 90%, HER-2 negative ratio 1.28, Ki-67 15%, T2 N0 stage II a Oncotype DX score 23, 15% risk of recurrence  Adjuvant radiation therapy from 03/11/2016 to 04/07/2016  Treatment plan: Anastrozole 1 mg by mouth daily Anastrozole counseling:We discussed the risks and benefits of anti-estrogen therapy with aromatase inhibitors. These include but not limited to insomnia, hot flashes, mood changes, vaginal dryness, bone density loss, and weight gain. We strongly believe that the benefits far outweigh the risks. Patient understands these risks and consented to starting treatment. Planned treatment duration is 5 years.  Return to clinic in 3 months for follow-up

## 2016-05-06 NOTE — Addendum Note (Signed)
Addended by: Cheree Ditto on: 05/06/2016 10:54 AM   Modules accepted: Orders

## 2016-05-06 NOTE — Progress Notes (Signed)
Patient Care Team: Leanna Battles, [name], [name], [name], MD as Consulting Physician (Radiation Oncology)  DIAGNOSIS: Breast cancer of lower-outer quadrant of left female breast [facility])   Staging form: Breast, AJCC 7th Edition   - Clinical stage from 12/17/2015: Stage IA (T1c, N0, M0) - Signed by Nicholas Lose, MD on 12/17/2015  SUMMARY OF ONCOLOGIC HISTORY:   Breast cancer of lower-outer quadrant of left female breast ([place])   12/11/2015 Initial Diagnosis    Left breast mass at 3:00: 1.7 x 1.6 x 1.2 cm, axilla negative, grade 2 IDC with DCIS, ER/PR positive HER-2 negative Ki-67 15%, T1 cN0 stage IA clinical stage      01/22/2016 Surgery    Left lumpectomy: IDC grade 1, 2.1 cm, with intermediate grade DCIS, LVI present, PNI present, 0/2 lymph nodes negative, ER 95%, PR 90%, HER-2 negative ratio 1.28, Ki-67 15%, T2 N0 stage II a, Oncotype 23, 15% ROR      03/11/2016 - 04/07/2016 Radiation Therapy    Adjuvant radiation therapy       CHIEF COMPLIANT: follow-up after radiation is complete  INTERVAL HISTORY: Katrina Young is a 75 year old with above-mentioned history left breast cancer treated with lumpectomy and radiation and is here today to discuss starting antiestrogen therapy. She has tolerated radiation fairly well. 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  REVIEW OF SYSTEMS:   Constitutional: Denies fevers, chills or abnormal weight loss Eyes: Denies blurriness of vision Ears, nose, mouth, throat, and face: Denies mucositis or sore throat Respiratory: Denies cough, dyspnea or wheezes Cardiovascular: Denies palpitation, chest discomfort Gastrointestinal:  Denies nausea, heartburn or change in bowel habits Skin: Denies abnormal skin rashes Lymphatics: Denies new lymphadenopathy or easy  bruising Neurological:Denies numbness, tingling or new weaknesses Behavioral/Psych: Mood is stable, no new changes  Extremities: No lower extremity edema Breast:  Intermittent left breast pain All other systems were reviewed with the patient and are negative.  I have reviewed the past medical history, past surgical history, social history and family history with the patient and they are unchanged from previous note.  ALLERGIES:  is allergic to codeine and morphine and related.  MEDICATIONS:  Current Outpatient Prescriptions  Medication Sig Dispense Refill  . aspirin EC 81 MG tablet Take 81 mg by mouth daily.    Marland Kitchen atenolol (TENORMIN) 50 MG tablet Take 50 mg by mouth every morning.     . Calcium-Vitamin D-Vitamin K (CALCIUM SOFT CHEWS PO) Take by mouth.    . cholecalciferol (VITAMIN D) 1000 UNITS tablet Take 1,000 Units by mouth daily.    Marland Kitchen esomeprazole (NEXIUM) 40 MG capsule Take 40 mg by mouth daily before breakfast.     . hyaluronate sodium (RADIAPLEXRX) GEL Apply 1 application topically 2 (two) times daily.    Marland Kitchen ibuprofen (ADVIL,MOTRIN) 200 MG tablet Take 200 mg by mouth every 6 (six) hours as needed.    Marland Kitchen LORazepam (ATIVAN) 1 MG tablet Take 1 mg by mouth 2 (two) times daily as needed for anxiety.     Marland Kitchen losartan (COZAAR) 100 MG tablet Take 100 mg by mouth daily.     . Misc Natural Products (OSTEO BI-FLEX/5-LOXIN ADVANCED PO) Take by mouth.    . Multiple Vitamin (MULTIVITAMIN WITH MINERALS) TABS tablet Take 1 tablet by mouth daily.    . non-metallic deodorant Jethro Poling) MISC Apply 1 application topically daily as  needed.    . Omega 3 1000 MG CAPS Take 1 capsule by mouth daily.    . Phenazopyridine HCl (AZO TABS PO) Take 2 tablets by mouth daily.     . pravastatin (PRAVACHOL) 20 MG tablet Take 20 mg by mouth daily.     . promethazine (PHENERGAN) 12.5 MG tablet Take 1 tablet (12.5 mg total) by mouth every 6 (six) hours as needed for nausea or vomiting. 30 tablet 0  . vitamin B-12  (CYANOCOBALAMIN) 1000 MCG tablet Take 1,000 mcg by mouth daily.     No current facility-administered medications for this visit.     PHYSICAL EXAMINATION: ECOG PERFORMANCE STATUS: 1 - Symptomatic but completely ambulatory  Vitals:   05/06/16 1024  BP: (!) 156/58  Pulse: (!) 54  Resp: 18  Temp: 97.6 F (36.4 C)   Filed Weights   05/06/16 1024  Weight: 162 lb 6.4 oz (73.7 kg)    GENERAL:alert, no distress and comfortable SKIN: skin color, texture, turgor are normal, no rashes or significant lesions EYES: normal, Conjunctiva are pink and non-injected, sclera clear OROPHARYNX:no exudate, no erythema and lips, buccal mucosa, and tongue normal  NECK: supple, thyroid normal size, non-tender, without nodularity LYMPH:  no palpable lymphadenopathy in the cervical, axillary or inguinal LUNGS: clear to auscultation and percussion with normal breathing effort HEART: regular rate & rhythm and no murmurs and no lower extremity edema ABDOMEN:abdomen soft, non-tender and normal bowel sounds MUSCULOSKELETAL:no cyanosis of digits and no clubbing  NEURO: alert & oriented x 3 with fluent speech, no focal motor/sensory deficits EXTREMITIES: No lower extremity edema  LABORATORY DATA:  I have reviewed the data as listed   Chemistry      Component Value Date/Time   NA 141 01/19/2016 1020   NA 144 12/17/2015 0810   K 4.5 01/19/2016 1020   K 4.4 12/17/2015 0810   CL 105 01/19/2016 1020   CO2 29 01/19/2016 1020   CO2 25 12/17/2015 0810   BUN 13 01/19/2016 1020   BUN 17.0 12/17/2015 0810   CREATININE 0.64 01/19/2016 1020   CREATININE 0.8 12/17/2015 0810      Component Value Date/Time   CALCIUM 9.9 01/19/2016 1020   CALCIUM 9.8 12/17/2015 0810   ALKPHOS 93 01/19/2016 1020   ALKPHOS 104 12/17/2015 0810   AST 28 01/19/2016 1020   AST 24 12/17/2015 0810   ALT 25 01/19/2016 1020   ALT 23 12/17/2015 0810   BILITOT 0.6 01/19/2016 1020   BILITOT 0.59 12/17/2015 0810       Lab Results   Component Value Date   WBC 4.7 01/19/2016   HGB 12.7 01/19/2016   HCT 38.4 01/19/2016   MCV 86.3 01/19/2016   PLT 191 01/19/2016   NEUTROABS 2.8 01/19/2016     ASSESSMENT & PLAN:  Breast cancer of lower-outer quadrant of left female breast ([place]) Left lumpectomy 01/22/2016: IDC grade 1, 2.1 cm, with intermediate grade DCIS, LVI present, PNI present, 0/2 lymph nodes negative, ER 95%, PR 90%, HER-2 negative ratio 1.28, Ki-67 15%, T2 N0 stage II a Oncotype DX score 23, 15% risk of recurrence  Adjuvant radiation therapy from 03/11/2016 to 04/07/2016  Treatment plan: Anastrozole 1 mg by mouth daily Anastrozole counseling:We discussed the risks and benefits of anti-estrogen therapy with aromatase inhibitors. These include but not limited to insomnia, hot flashes, mood changes, vaginal dryness, bone density loss, and weight gain. We strongly believe that the benefits far outweigh the risks. Patient understands these risks and consented to  starting treatment. Planned treatment duration is 5 years.  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 Surveillance: Patient will need annual mammograms and breast exams. Prior history of ovarian malignancy: Patient follows with her gynecologist for the surveillance with CA-125's.  Return to clinic in 3 months for follow-up  No orders of the defined types were placed in this encounter.  The patient has a good understanding of the overall plan. she agrees with it. she will call with any problems that may develop before the next visit here.   Rulon Eisenmenger, MD 05/06/16

## 2016-05-11 DIAGNOSIS — L570 Actinic keratosis: Secondary | ICD-10-CM | POA: Diagnosis not present

## 2016-05-13 ENCOUNTER — Encounter: Payer: Self-pay | Admitting: Radiation Oncology

## 2016-05-13 ENCOUNTER — Telehealth: Payer: Self-pay | Admitting: *Deleted

## 2016-05-13 ENCOUNTER — Ambulatory Visit
Admission: RE | Admit: 2016-05-13 | Discharge: 2016-05-13 | Disposition: A | Discharge status: Home / Self Care | Payer: Medicare Other | Source: Ambulatory Visit | Attending: Radiation Oncology | Admitting: Radiation Oncology

## 2016-05-13 VITALS — BP 155/74 | HR 54 | Temp 97.9°F | Ht 63.5 in | Wt 162.4 lb

## 2016-05-13 DIAGNOSIS — C50912 Malignant neoplasm of unspecified site of left female breast: Secondary | ICD-10-CM | POA: Insufficient documentation

## 2016-05-13 DIAGNOSIS — Z79899 Other long term (current) drug therapy: Secondary | ICD-10-CM | POA: Diagnosis not present

## 2016-05-13 DIAGNOSIS — Z7982 Long term (current) use of aspirin: Secondary | ICD-10-CM | POA: Insufficient documentation

## 2016-05-13 DIAGNOSIS — Z79811 Long term (current) use of aromatase inhibitors: Secondary | ICD-10-CM | POA: Diagnosis not present

## 2016-05-13 DIAGNOSIS — Z885 Allergy status to narcotic agent status: Secondary | ICD-10-CM | POA: Diagnosis not present

## 2016-05-13 DIAGNOSIS — Z170 Estrogen receptor positive status [ER+]: Secondary | ICD-10-CM | POA: Insufficient documentation

## 2016-05-13 DIAGNOSIS — Z923 Personal history of irradiation: Secondary | ICD-10-CM | POA: Diagnosis not present

## 2016-05-13 DIAGNOSIS — C50512 Malignant neoplasm of lower-outer quadrant of left female breast: Secondary | ICD-10-CM

## 2016-05-13 NOTE — Telephone Encounter (Signed)
I CAN'T TELL WHAT TIME I'M TO BE THERE.   3:30 OR 1:30.  IT LOOKS LIKE A CHECK MARK , A 1, 30 AND a "P."  Appointment is at 1:30 with Shona Simpson in [place]

## 2016-05-13 NOTE — Progress Notes (Addendum)
Ms. Sandrock here for reassessment S/P XRT to her left breast.  She is currently taking Arimidex and reports headaches on the left occipital region daily at ~ 4-5 pm, but denies any muscle or joint pain.  Reports that she has very mild erythema to her left breast, and has intermittent shooting pains in this area.  BP (!) 155/74 (BP Location: Right Arm, Patient Position: Sitting, Cuff Size: Small)   Pulse (!) 54   Temp 97.9 F (36.6 C) (Oral)   Ht 5' 3.5" (1.613 m)   Wt 162 lb 6.4 oz (73.7 kg)   BMI 28.32 kg/m    Wt Readings from Last 3 Encounters:  05/13/16 162 lb 6.4 oz (73.7 kg)  05/06/16 162 lb 6.4 oz (73.7 kg)  04/07/16 162 lb 11.2 oz (73.8 kg)

## 2016-05-14 ENCOUNTER — Other Ambulatory Visit: Payer: Self-pay | Admitting: Radiation Oncology

## 2016-05-14 DIAGNOSIS — C50512 Malignant neoplasm of lower-outer quadrant of left female breast: Secondary | ICD-10-CM

## 2016-05-14 DIAGNOSIS — Z170 Estrogen receptor positive status [ER+]: Principal | ICD-10-CM

## 2016-05-14 NOTE — Progress Notes (Signed)
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  Interval Since Last Radiation:  4 weeks   03/11/2016 through 04/09/2016: The patient initially received a dose of 42.5 Gy in 17 fractions to the breast using whole-breast tangent fields. This was delivered using a 3-D conformal technique. The patient then received a boost to the seroma. This delivered an additional 7.5 Gy in 3 fractions using a 3 field photon technique due to the depth of the seroma. The total dose was 50 Gy.  Narrative:  The patient returns today for routine follow-up.  She tolerated radiotherapy well without desquamation.                            On review of systems, the patient states she is feeling quite well. She denies any chest pain or shortness of breath. No complaints are otherwise noted.  ALLERGIES:  is allergic to codeine and morphine and related.  Meds: Current Outpatient Prescriptions  Medication Sig Dispense Refill  . anastrozole (ARIMIDEX) 1 MG tablet Take 1 tablet (1 mg total) by mouth daily. 90 tablet 3  . Ascorbic Acid (VITAMIN C) 100 MG tablet Take 100 mg by mouth daily.    Marland Kitchen aspirin EC 81 MG tablet Take 81 mg by mouth daily.    Marland Kitchen atenolol (TENORMIN) 50 MG tablet Take 50 mg by mouth every morning.     . Calcium-Vitamin D-Vitamin K (CALCIUM SOFT CHEWS PO) Take by mouth.    . cholecalciferol (VITAMIN D) 1000 UNITS tablet Take 1,000 Units by mouth daily.    Marland Kitchen esomeprazole (NEXIUM) 40 MG capsule Take 40 mg by mouth daily before breakfast.     . ibuprofen (ADVIL,MOTRIN) 200 MG tablet Take 200 mg by mouth every 6 (six) hours as needed.    Marland Kitchen LORazepam (ATIVAN) 1 MG tablet Take 1 mg by mouth 2 (two) times daily as needed for anxiety.       Marland Kitchen losartan (COZAAR) 100 MG tablet Take 100 mg by mouth daily.     . Misc Natural Products (OSTEO BI-FLEX/5-LOXIN ADVANCED PO) Take by mouth.    . Multiple Vitamin (MULTIVITAMIN WITH MINERALS) TABS tablet Take 1 tablet by mouth daily.    . Omega 3 1000 MG CAPS Take 1 capsule by mouth daily.    . Phenazopyridine HCl (AZO TABS PO) Take 2 tablets by mouth daily.     . vitamin B-12 (CYANOCOBALAMIN) 1000 MCG tablet Take 1,000 mcg by mouth daily.    . pravastatin (PRAVACHOL) 20 MG tablet Take 20 mg by mouth daily.      No current facility-administered medications for this encounter.     Physical Findings:  height is 5' 3.5" (1.613 m) and weight is 162 lb 6.4 oz (73.7 kg). Her oral temperature is 97.9 F (36.6 C). Her blood pressure is 155/74 (abnormal) and her pulse is 54 (abnormal).  In general this is a well appearing  in no acute distress. She's alert and oriented x4 and appropriate throughout the examination. Cardiopulmonary assessment is negative for acute distress and she exhibits normal effort. The left breast is intact without desquamation.  Lab Findings: Lab Results  Component Value Date  WBC 4.7 01/19/2016   HGB 12.7 01/19/2016   HCT 38.4 01/19/2016   MCV 86.3 01/19/2016   PLT 191 01/19/2016     Radiographic Findings: No results found.  Impression/Plan: 1. 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 The patient has done well since completion of radiotherapy. She will continue to follow up with medical oncology and is doing well with arimidex. We will be happy to see her in the future as needed. We did discuss skin care and sun exposure as well as precautions.  2. Survivorship. She has not met with Mike Craze, NP and is encouraged to proceed with an evaluation. A referral will be made for this.      Carola Rhine, PAC

## 2016-05-17 ENCOUNTER — Telehealth: Payer: Self-pay | Admitting: *Deleted

## 2016-05-17 NOTE — Telephone Encounter (Signed)
Called patient to inform of survivorship appt. With Mike Craze on 06-24-16 @ 1 pm, lvm for a return call

## 2016-05-18 NOTE — Addendum Note (Signed)
Encounter addended by: Doreen Beam, RN on: 05/18/2016 12:32 PM<BR>    Actions taken: Charge Capture section accepted

## 2016-05-20 DIAGNOSIS — Z8543 Personal history of malignant neoplasm of ovary: Secondary | ICD-10-CM | POA: Diagnosis not present

## 2016-05-20 DIAGNOSIS — Z124 Encounter for screening for malignant neoplasm of cervix: Secondary | ICD-10-CM | POA: Diagnosis not present

## 2016-05-20 DIAGNOSIS — Z6828 Body mass index (BMI) 28.0-28.9, adult: Secondary | ICD-10-CM | POA: Diagnosis not present

## 2016-05-24 DIAGNOSIS — N3941 Urge incontinence: Secondary | ICD-10-CM | POA: Diagnosis not present

## 2016-05-24 DIAGNOSIS — N3020 Other chronic cystitis without hematuria: Secondary | ICD-10-CM | POA: Diagnosis not present

## 2016-06-08 DIAGNOSIS — C44309 Unspecified malignant neoplasm of skin of other parts of face: Secondary | ICD-10-CM | POA: Diagnosis not present

## 2016-06-24 ENCOUNTER — Other Ambulatory Visit: Payer: Self-pay | Admitting: *Deleted

## 2016-06-24 ENCOUNTER — Ambulatory Visit (HOSPITAL_BASED_OUTPATIENT_CLINIC_OR_DEPARTMENT_OTHER): Payer: Medicare Other | Admitting: Adult Health

## 2016-06-24 ENCOUNTER — Telehealth: Payer: Self-pay | Admitting: *Deleted

## 2016-06-24 ENCOUNTER — Ambulatory Visit (HOSPITAL_BASED_OUTPATIENT_CLINIC_OR_DEPARTMENT_OTHER): Payer: Medicare Other

## 2016-06-24 VITALS — BP 139/71 | HR 56 | Temp 97.9°F | Resp 18 | Ht 63.5 in | Wt 165.7 lb

## 2016-06-24 DIAGNOSIS — R300 Dysuria: Secondary | ICD-10-CM

## 2016-06-24 DIAGNOSIS — G479 Sleep disorder, unspecified: Secondary | ICD-10-CM | POA: Diagnosis not present

## 2016-06-24 DIAGNOSIS — R530 Neoplastic (malignant) related fatigue: Secondary | ICD-10-CM | POA: Diagnosis not present

## 2016-06-24 DIAGNOSIS — N390 Urinary tract infection, site not specified: Secondary | ICD-10-CM

## 2016-06-24 DIAGNOSIS — C50512 Malignant neoplasm of lower-outer quadrant of left female breast: Secondary | ICD-10-CM | POA: Diagnosis not present

## 2016-06-24 DIAGNOSIS — R319 Hematuria, unspecified: Principal | ICD-10-CM

## 2016-06-24 LAB — URINALYSIS, MICROSCOPIC - CHCC
Bilirubin (Urine): NEGATIVE
Glucose: NEGATIVE mg/dL
Ketones: NEGATIVE mg/dL
Nitrite: NEGATIVE
Protein: NEGATIVE mg/dL
Specific Gravity, Urine: 1.01 (ref 1.003–1.035)
Urobilinogen, UR: 0.2 mg/dL (ref 0.2–1)
pH: 7.5 (ref 4.6–8.0)

## 2016-06-24 MED ORDER — NITROFURANTOIN MONOHYD MACRO 100 MG PO CAPS
100.0000 mg | ORAL_CAPSULE | Freq: Two times a day (BID) | ORAL | 0 refills | Status: DC
Start: 2016-06-24 — End: 2016-06-28

## 2016-06-24 NOTE — Progress Notes (Signed)
CLINIC:  Survivorship   REASON FOR VISIT:  Routine follow-up post-treatment for a recent history of breast cancer.  BRIEF ONCOLOGIC HISTORY:    Breast cancer of lower-outer quadrant of left female breast ([place])   12/11/2015 Initial Diagnosis    Left breast mass at 3:00: 1.7 x 1.6 x 1.2 cm, axilla negative, grade 2 IDC with DCIS, ER/PR positive HER-2 negative Ki-67 15%, T1 cN0 stage IA clinical stage      01/02/2016 Procedure    Genetic testing: VUS on POLD1 gene called c.3989G>A. Genes analyzed: APC, ATM, AXIN2, BAP1, BARD1, BMPR1A, BRCA1, BRCA2, BRIP1, CDH1, CDK4, CDKN2A, CHEK2, EPCAM, FANCC, MITF, MLH1, MSH2, MSH6, MUTYH, NBN, PALB2, PMS2, POLD1, POLE, PTEN, RAD51C, RAD51D, SCG5/GREM1, SMAD4, STK11, TP53, VHL, and XRCC2      01/22/2016 Surgery    Left lumpectomy ([name]): IDC grade 1, 2.1 cm, with intermediate grade DCIS, LVI present, PNI present, 0/2 lymph nodes negative, ER 95%, PR 90%, HER-2 negative ratio 1.28, Ki-67 15%, T2 N0 stage II a, Oncotype 23, 15% ROR      01/22/2016 Oncotype testing    Recurrence score: 23; ROR 15% (intermediate risk)       03/11/2016 - 04/07/2016 Radiation Therapy    Adjuvant radiation therapy [name]). Left breast: 42.5 Gy in 17 treatments. Left breast "boost": 7.5 Gy in 3 treatments.       04/2016 -  Anti-estrogen oral therapy    Anastrozole 1 mg daily. Planned duration of therapy: 5 years.        INTERVAL HISTORY:  Ms. Essex presents to the [facility] today for our initial meeting to review her survivorship care plan detailing her treatment course for breast cancer, as well as monitoring long-term side effects of that treatment, education regarding health maintenance, screening, and overall wellness and health promotion.     Overall, Ms. Sterba reports feeling quite well since completing her radiation therapy approximately 2 months ago.  She Started her antiestrogen therapy with anastrozole about one month ago. Thus far, she is tolerating  the medication well. She reports occasional hot flashes, which she states are not severe or very bothersome for her. "I don't break out in any sweats or anything. They don't even happen every day."  Denies any worsening arthralgias/myalgias since starting Anastrozole.  She does report continuing to have some fatigue, and states "well and 75 years old." Her fatigue does not interfere with her ability to do her activities independently. She does feel like the fatigue is improving since completing radiation.  She does report that she will up this morning with burning with urination. She tells me she has had several UTIs in the past, where she had frank blood in her urine, pelvic pressure and pain. She denies any pelvic pressure or hematuria. She is wondering if she may have an "early UTI."  She states that she had a DEXA scan done in 03/2015 at [facility]. She sees her gynecologist regularly for her history of ovarian cancer.    REVIEW OF SYSTEMS:  Review of Systems  Constitutional: Positive for fatigue.  HENT:  Negative.   Eyes: Negative.   Respiratory: Negative.   Cardiovascular: Negative.   Gastrointestinal: Negative.   Endocrine: Positive for hot flashes.  Genitourinary: Positive for dysuria. Negative for hematuria and vaginal bleeding.   Neurological: Negative.   Hematological: Negative.   Psychiatric/Behavioral: Negative.   Breast: Denies any new nodularity, masses, tenderness, nipple changes, or nipple discharge.    A 14-point review of  systems was completed and was negative, except as noted above.   ONCOLOGY TREATMENT TEAM:  1. Surgeon:  Dr. Brantley Stage at [facility] 2. Medical Oncologist: Dr. Lindi Adie 3. Radiation Oncologist: Dr. [name]    PAST MEDICAL/SURGICAL HISTORY:  Past Medical History:  Diagnosis Date  . Arthritis    legs, back. neck  . Breast cancer ([place])   . Breast cancer of lower-outer quadrant of left female breast ([place]) 12/11/2015   . Cancer ([place])    ovarian cancer (hysterectomy-12-14 years ago) no txs  . Complication of anesthesia   . Constipation   . Diminished eyesight change in eyesight   cataracts  . Family history of breast cancer   . Family history of colon cancer   . Family history of melanoma   . Family history of prostate cancer   . GERD (gastroesophageal reflux disease)   . 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 Hyperlipidemia   . Hypertension   . Joint pain   . Leg pain   . PONV (postoperative nausea and vomiting)   . Reflux   . Shortness of breath dyspnea    increased walking or climbing stairs  . Varicose veins   . Weight gain    Past Surgical History:  Procedure Laterality Date  . ABDOMINAL HYSTERECTOMY     total hx of ovarian cancer   . BLADDER SURGERY    . BREAST SURGERY  30-40 years   hx of removal of calcum build up -right breast   . CESAREAN SECTION  1966, 1968, 1973  . CHOLECYSTECTOMY    . colonscopy     . ENDOVENOUS ABLATION SAPHENOUS VEIN W/ LASER  12-30-2011   left greater saphenous vein  . EVLA  R  GSV  02-10-2011  . EVLA   RIGHT SMALL SAPHENOUS VEIN 09-13-2007  . RADIOACTIVE SEED GUIDED MASTECTOMY WITH AXILLARY SENTINEL LYMPH NODE BIOPSY Left 01/22/2016   Procedure: LEFT BREAST RADIOACTIVE SEED GUIDED PARTIAL MASTECTOMY WITH AXILLARY SENTINEL LYMPH NODE BIOPSY;  Surgeon: Erroll Luna, MD;  Location: [facility];  Service: General;  Laterality: Left;  . SHOULDER SURGERY     bilat - rotaor cuff repair secondary to tear  . TONSILLECTOMY    . VENTRAL HERNIA REPAIR Katrina Young 07/23/2014   Procedure: VENTRAL INCISIONAL HERNIA REPAIR WITH MESH;  Surgeon: Armandina Gemma, MD;  Location: [facility];  Service: General;  Laterality: Katrina Young;     ALLERGIES:  Allergies  Allergen Reactions  . Codeine Nausea Only  . Morphine And Related Nausea Only     CURRENT MEDICATIONS:  Outpatient Encounter Prescriptions as of 06/24/2016  Medication Sig Note  . anastrozole (ARIMIDEX) 1 MG  tablet Take 1 tablet (1 mg total) by mouth daily.   . Ascorbic Acid (VITAMIN C) 100 MG tablet Take 100 mg by mouth daily.   Marland Kitchen aspirin EC 81 MG tablet Take 81 mg by mouth daily. 03/01/2016: Restarted taking  . atenolol (TENORMIN) 50 MG tablet Take 50 mg by mouth every morning.    . Calcium-Vitamin D-Vitamin K (CALCIUM SOFT CHEWS PO) Take by mouth.   . cholecalciferol (VITAMIN D) 1000 UNITS tablet Take 1,000 Units by mouth daily.   Marland Kitchen esomeprazole (NEXIUM) 40 MG capsule Take 40 mg by mouth daily before breakfast.    . LORazepam (ATIVAN) 1 MG tablet Take 1 mg by mouth 2 (two) times daily as needed for anxiety.    Marland Kitchen losartan (COZAAR) 100 MG tablet Take 100 mg by mouth daily.  12/17/2015: Received from:  [facility]  . Misc Natural Products (OSTEO BI-FLEX/5-LOXIN ADVANCED PO) Take by mouth.   . Multiple Vitamin (MULTIVITAMIN WITH MINERALS) TABS tablet Take 1 tablet by mouth daily.   . Omega 3 1000 MG CAPS Take 1 capsule by mouth daily. 03/01/2016: restarted  . Phenazopyridine HCl (AZO TABS PO) Take 2 tablets by mouth daily. Pt takes as needed when she has a flare-up   . pravastatin (PRAVACHOL) 20 MG tablet Take 20 mg by mouth daily.    . vitamin B-12 (CYANOCOBALAMIN) 1000 MCG tablet Take 1,000 mcg by mouth daily.   Marland Kitchen ibuprofen (ADVIL,MOTRIN) 200 MG tablet Take 200 mg by mouth every 6 (six) hours as needed.    No facility-administered encounter medications on file as of 06/24/2016.      ONCOLOGIC FAMILY HISTORY:  Family History  Problem Relation Age of Onset  . Heart disease Mother   . Hypertension Father   . Heart disease Father   . Breast cancer Sister 80  . Leukemia Brother 11  . Melanoma Brother 25    on his chest  . Prostate cancer Brother 26  . Colon cancer Sister 10  . Melanoma Brother 71  . Diabetes Sister   . Heart disease Sister   . Prostate cancer Brother 1  . Stroke Brother   . Breast cancer Other 50  . Cancer Other     unknown - died quickly  . Breast  cancer Cousin     dx 35-40 yrs, maternal first cousin  . Dementia Cousin      GENETIC COUNSELING/TESTING: 01/02/16-Genetic testing: VUS on POLD1 gene called c.3989G>A. Genes analyzed: APC, ATM, AXIN2, BAP1, BARD1, BMPR1A, BRCA1, BRCA2, BRIP1, CDH1, CDK4, CDKN2A, CHEK2, EPCAM, FANCC, MITF, MLH1, MSH2, MSH6, MUTYH, NBN, PALB2, PMS2, POLD1, POLE, PTEN, RAD51C, RAD51D, SCG5/GREM1, SMAD4, STK11, TP53, VHL, and XRCC2   SOCIAL HISTORY:  NAZYIA GAUGH is married and lives with her husband in [place], [place].  They have 3 children (1 son and 2 daughters).  Her children all live in the [place] area.  They have 9 grandchildren and 2 great grandchildren.  Ms. Leaman is retired. She denies any current tobacco, alcohol, or illicit drug use.     PHYSICAL EXAMINATION:  Vital Signs:   Vitals:   06/24/16 1300  BP: 139/71  Pulse: (!) 56  Resp: 18  Temp: 97.9 F (36.6 C)   Filed Weights   06/24/16 1300  Weight: 165 lb 11.2 oz (75.2 kg)   General: Well-nourished, well-appearing female in no acute distress.  She is accompanied by her husband today.   HEENT: Head is normocephalic.  Pupils equal and reactive to light. Conjunctivae clear without exudate.  Sclerae anicteric. Oral mucosa is pink, moist.  Oropharynx is pink without lesions or erythema.  Lymph: No cervical, supraclavicular, or infraclavicular lymphadenopathy noted on palpation.  Cardiovascular: Regular rate and rhythm. Respiratory: Clear to auscultation bilaterally. Chest expansion symmetric; breathing non-labored.  GI: Abdomen soft and round; non-tender, non-distended. Bowel sounds normoactive. No suprapubic tenderness with palpation.  GU: Deferred.  Neuro: No focal deficits. Steady gait.  Psych: Mood and affect normal and appropriate for situation.  Extremities: No edema. Skin: Warm and dry.  LABORATORY DATA:    DIAGNOSTIC IMAGING:  None for this visit.      ASSESSMENT AND PLAN:  Ms.. Coreas is a pleasant 75 y.o. 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  She presents to the Survivorship Clinic for our initial meeting and routine follow-up post-completion of treatment for breast cancer.    1. Stage IA left breast cancer:  Ms. Erichsen is continuing to recover from definitive treatment for breast cancer. She will follow-up with her medical oncologist, Dr. Lindi Adie, in 07/2016 with history and physical exam per surveillance protocol.  She will continue her anti-estrogen therapy with anastrozole. Thus far, she is tolerating the medication well, with minimal side effects. She was instructed to make Dr. Lindi Adie or myself aware if she begins to experience any worsening side effects of the medication and I could see her back in clinic to help manage those side effects, as needed. Common side effects of anastrozole were again reviewed with her as well. Today, a comprehensive survivorship care plan and treatment summary was reviewed with the patient today detailing her breast cancer diagnosis, treatment course, potential late/long-term effects of treatment, appropriate follow-up care with recommendations for the future, and patient education resources.  A copy of this summary, along with a letter will be sent to the patient's primary care provider via mail/fax/In Basket message after today's visit.    2. Dysuria: Ms. Rudy has a history of UTIs/chronic cystitis. She has not had an infection in quite some time.  She tells me she woke up this morning with dysuria; denies frank hematuria, pelvic pressure, fever/chills, or new low back pain.  UA collected in clinic today and revealed trace blood, moderate leukocyte esterase, 11-20 WBCs, and few bacteria. Nitrite negative. Given her symptoms, I will empirically treat her for UTI with Macrobid 100 mg twice daily 5 days. Urine culture has  been sent for antimicrobial susceptibility. I encouraged her to follow-up with her PCP, if her symptoms do not improve with antibiotic therapy.  3. Fatigue/Sleep disturbance: Ms. Barbee endorses fatigue, which is slowly improving since completing radiation therapy. She is still able to do all of her activities independently despite the fatigue. She doesn't sleep well at night, which has been a chronic problem. She does tell me that she often during the day. We discussed the use of physical activity in helping combat treatment-related fatigue, which also may help her sleep better at night.  We discussed the [employer] fitness program, which is designed for cancer survivors to help them become more physically fit after cancer treatments.  We also discussed the importance of sleep hygiene as well. Likely her symptoms will continue to improve on their own.  4. Bone health:  Given Ms. Dinger age, history of breast cancer, and her current treatment regimen including anti-estrogen therapy with anastrozole, she is at risk for bone demineralization. Reportedly, [facility] We do not have records available for review. Therefore, we will request the records be faxed to [place] and that we may have baseline information of her bone density. She understands that anastrozole may weaken bone density over time, & she was encouraged to increase her consumption of foods [place] in calcium, as well as increase her weight-bearing activities.  She was given education on specific activities to promote bone health.  5. Cancer screening:  Due to Ms. Kipp's history and her age, she should receive screening for skin cancers, colon cancer, and gynecologic cancers.  The information and recommendations are listed on the patient's comprehensive care plan/treatment summary and were reviewed in detail with the patient.    6. Health maintenance and wellness promotion: Ms.  Curtice was encouraged to  consume 5-7 servings of fruits and vegetables per day. We reviewed the "Nutrition Rainbow" handout, as well as the handout "Take Control of Your Health and Reduce Your Cancer Risk" from the [employer].  She was also encouraged to engage in moderate to vigorous exercise for 30 minutes per day most days of the week.  She was instructed to limit her alcohol consumption and continue to abstain from tobacco use.   7. Support services/counseling: It is not uncommon for this period of the patient's cancer care trajectory to be one of many emotions and stressors.  We discussed an opportunity for her to participate in the next session of [facility] ("Finding Your New Normal") support group series designed for patients after they have completed treatment.   Ms. Sowder was encouraged to take advantage of our many other support services programs, support groups, and/or counseling in coping with her new life as a cancer survivor after completing anti-cancer treatment.  She was offered support today through active listening and expressive supportive counseling.  She was given information regarding our available services and encouraged to contact me with any questions or for help enrolling in any of our support group/programs.    Dispo:   -Return to cancer center to see Dr. Lindi Adie in 07/2016.   -She is welcome to return back to the Survivorship Clinic at any time; no additional follow-up needed at this time.  -Consider referral back to survivorship as a long-term survivor for continued surveillance  A total of 60 minutes of face-to-face time was spent with this patient with greater than 50% of that time in counseling and care-coordination.   Mike Craze, NP Survivorship Program [place] [phone]   Note: PRIMARY CARE PROVIDER Donnajean Lopes, [place] [phone]

## 2016-06-24 NOTE — Telephone Encounter (Signed)
Called pt with results of U/A. No answer but left a detailed message to VM about the results and to [facility], also left message with details how often to take medication x 5 days. Left my number if pt has any questions , she can call this nurse back. Message to be fwd to G. Dawson,NP.

## 2016-06-25 ENCOUNTER — Encounter: Payer: Self-pay | Admitting: Adult Health

## 2016-06-26 LAB — URINE CULTURE

## 2016-06-28 ENCOUNTER — Telehealth: Payer: Self-pay | Admitting: *Deleted

## 2016-06-28 ENCOUNTER — Other Ambulatory Visit: Payer: Self-pay | Admitting: Adult Health

## 2016-06-28 ENCOUNTER — Encounter: Payer: Self-pay | Admitting: Adult Health

## 2016-06-28 DIAGNOSIS — C50512 Malignant neoplasm of lower-outer quadrant of left female breast: Secondary | ICD-10-CM

## 2016-06-28 DIAGNOSIS — N390 Urinary tract infection, site not specified: Secondary | ICD-10-CM

## 2016-06-28 DIAGNOSIS — R319 Hematuria, unspecified: Principal | ICD-10-CM

## 2016-06-28 DIAGNOSIS — Z170 Estrogen receptor positive status [ER+]: Secondary | ICD-10-CM

## 2016-06-28 MED ORDER — SULFAMETHOXAZOLE-TRIMETHOPRIM 800-160 MG PO TABS: 1.0000 | ORAL_TABLET | Freq: Two times a day (BID) | ORAL | 0 refills | Status: DC

## 2016-06-28 NOTE — Progress Notes (Signed)
  Urine culture suggests resistance to Macrobid.  Will change to Bactrim DS.   Mike Craze, NP [place] [phone]

## 2016-06-28 NOTE — Telephone Encounter (Signed)
Per Mike Craze, NP, I called and left a message for the patient stating,"Gretchen has called in a new antibiotic for you based off of your urine culture. You need to stop the old antibiotic and start this one today. If you have any questions, please call [phone]. "

## 2016-06-28 NOTE — Progress Notes (Signed)
Urine culture with the following results.  Will stop Macrobid and start Bactrim DS x 5 days.  [facility].                           Review of Systems - Oncology  Physical Exam

## 2016-07-05 ENCOUNTER — Encounter: Payer: Self-pay | Admitting: Adult Health

## 2016-07-05 NOTE — Progress Notes (Signed)
            Review of Systems - Oncology  Physical Exam    This encounter was created in error - please disregard.

## 2016-08-04 NOTE — Assessment & Plan Note (Signed)
Left lumpectomy 01/22/2016: IDC grade 1, 2.1 cm, with intermediate grade DCIS, LVI present, PNI present, 0/2 lymph nodes negative, ER 95%, PR 90%, HER-2 negative ratio 1.28, Ki-67 15%, T2 N0 stage II a Oncotype DX score 23, 15% risk of recurrence  Adjuvant radiation therapy from 03/11/2016 to 04/07/2016  Treatment plan: Anastrozole 1 mg by mouth daily Anastrozole Toxicities:  Surveillance:  1. Breast exam 08/05/16: No palpable concerns 2. Mammogram to be done in May 2018  Prior history of ovarian malignancy: Patient follows with her gynecologist for the surveillance with CA-125's.  Return to clinic in 6 months for follow-up

## 2016-08-05 ENCOUNTER — Ambulatory Visit (HOSPITAL_BASED_OUTPATIENT_CLINIC_OR_DEPARTMENT_OTHER): Payer: Medicare Other | Admitting: Hematology and Oncology

## 2016-08-05 ENCOUNTER — Encounter: Payer: Self-pay | Admitting: Hematology and Oncology

## 2016-08-05 DIAGNOSIS — C50512 Malignant neoplasm of lower-outer quadrant of left female breast: Secondary | ICD-10-CM | POA: Diagnosis not present

## 2016-08-05 DIAGNOSIS — Z170 Estrogen receptor positive status [ER+]: Secondary | ICD-10-CM | POA: Diagnosis not present

## 2016-08-05 DIAGNOSIS — Z8543 Personal history of malignant neoplasm of ovary: Secondary | ICD-10-CM

## 2016-08-05 DIAGNOSIS — M25552 Pain in left hip: Secondary | ICD-10-CM | POA: Diagnosis not present

## 2016-08-05 DIAGNOSIS — N951 Menopausal and female climacteric states: Secondary | ICD-10-CM

## 2016-08-05 NOTE — Progress Notes (Signed)
Patient Care Team: Leanna Battles, [name], [name], [name], MD as Consulting Physician (Radiation Oncology)  DIAGNOSIS:  Encounter Diagnosis  Name Primary?  . Malignant neoplasm of lower-outer quadrant of left breast of female, estrogen receptor positive ([place])     SUMMARY OF ONCOLOGIC HISTORY:   Breast cancer of lower-outer quadrant of left female breast ([place])   12/11/2015 Initial Diagnosis    Left breast mass at 3:00: 1.7 x 1.6 x 1.2 cm, axilla negative, grade 2 IDC with DCIS, ER/PR positive HER-2 negative Ki-67 15%, T1 cN0 stage IA clinical stage      01/02/2016 Procedure    Genetic testing: VUS on POLD1 gene called c.3989G>A. Genes analyzed: APC, ATM, AXIN2, BAP1, BARD1, BMPR1A, BRCA1, BRCA2, BRIP1, CDH1, CDK4, CDKN2A, CHEK2, EPCAM, FANCC, MITF, MLH1, MSH2, MSH6, MUTYH, NBN, PALB2, PMS2, POLD1, POLE, PTEN, RAD51C, RAD51D, SCG5/GREM1, SMAD4, STK11, TP53, VHL, and XRCC2      01/22/2016 Surgery    Left lumpectomy ([name]): IDC grade 1, 2.1 cm, with intermediate grade DCIS, LVI present, PNI present, 0/2 lymph nodes negative, ER 95%, PR 90%, HER-2 negative ratio 1.28, Ki-67 15%, T2 N0 stage II a, Oncotype 23, 15% ROR      01/22/2016 Oncotype testing    Recurrence score: 23; ROR 15% (intermediate risk)       03/11/2016 - 04/07/2016 Radiation Therapy    Adjuvant radiation therapy [name]). Left breast: 42.5 Gy in 17 treatments. Left breast "boost": 7.5 Gy in 3 treatments.       04/2016 -  Anti-estrogen oral therapy    Anastrozole 1 mg daily. Planned duration of therapy: 5 years.        CHIEF COMPLIANT: Follow-up on anastrozole  INTERVAL HISTORY: Katrina Young is a 76 year old with above-mentioned history left breast cancer treated with lumpectomy followed by adjuvant radiation. She is currently on anastrozole started 04/25/2016. She is  here for toxicity check and evaluation. She reports that she is tolerating it fairly well. She does have mild hot flashes. She is having some stiffness in the left shoulder and she is trying to stretch it out. She also complained of left hip pain. It appears to be getting better.  REVIEW OF SYSTEMS:   Constitutional: Denies fevers, chills or abnormal weight loss Eyes: Denies blurriness of vision Ears, nose, mouth, throat, and face: Denies mucositis or sore throat Respiratory: Denies cough, dyspnea or wheezes Cardiovascular: Denies palpitation, chest discomfort Gastrointestinal:  Denies nausea, heartburn or change in bowel habits Skin: Denies abnormal skin rashes Lymphatics: Denies new lymphadenopathy or easy bruising Neurological:Denies numbness, tingling or new weaknesses Behavioral/Psych: Mood is stable, no new changes  Extremities: No lower extremity edema Breast:  denies any pain or lumps or nodules in either breasts All other systems were reviewed with the patient and are negative.  I have reviewed the past medical history, past surgical history, social history and family history with the patient and they are unchanged from previous note.  ALLERGIES:  is allergic to codeine and morphine and related.  MEDICATIONS:  Current Outpatient Prescriptions  Medication Sig Dispense Refill  . anastrozole (ARIMIDEX) 1 MG tablet Take 1 tablet (1 mg total) by mouth daily. 90 tablet 3  . Ascorbic Acid (VITAMIN C) 100 MG tablet Take 100 mg by mouth daily.    Marland Kitchen aspirin EC 81 MG tablet Take 81 mg by mouth daily.    Marland Kitchen atenolol (  TENORMIN) 50 MG tablet Take 50 mg by mouth every morning.     . Calcium-Vitamin D-Vitamin K (CALCIUM SOFT CHEWS PO) Take by mouth.    . cholecalciferol (VITAMIN D) 1000 UNITS tablet Take 1,000 Units by mouth daily.    Marland Kitchen esomeprazole (NEXIUM) 40 MG capsule Take 40 mg by mouth daily before breakfast.     . ibuprofen (ADVIL,MOTRIN) 200 MG tablet Take 200 mg by mouth every 6 (six)  hours as needed.    Marland Kitchen LORazepam (ATIVAN) 1 MG tablet Take 1 mg by mouth 2 (two) times daily as needed for anxiety.     Marland Kitchen losartan (COZAAR) 100 MG tablet Take 100 mg by mouth daily.     . Misc Natural Products (OSTEO BI-FLEX/5-LOXIN ADVANCED PO) Take by mouth.    . Multiple Vitamin (MULTIVITAMIN WITH MINERALS) TABS tablet Take 1 tablet by mouth daily.    . Omega 3 1000 MG CAPS Take 1 capsule by mouth daily.    . Phenazopyridine HCl (AZO TABS PO) Take 2 tablets by mouth daily. Pt takes as needed when she has a flare-up    . pravastatin (PRAVACHOL) 20 MG tablet Take 20 mg by mouth daily.     . vitamin B-12 (CYANOCOBALAMIN) 1000 MCG tablet Take 1,000 mcg by mouth daily.     No current facility-administered medications for this visit.     PHYSICAL EXAMINATION: ECOG PERFORMANCE STATUS: 1 - Symptomatic but completely ambulatory  Vitals:   08/05/16 1027  BP: 132/66  Pulse: (!) 49  Resp: 17  Temp: 98 F (36.7 C)   Filed Weights   08/05/16 1027  Weight: 165 lb 11.2 oz (75.2 kg)    GENERAL:alert, no distress and comfortable SKIN: skin color, texture, turgor are normal, no rashes or significant lesions EYES: normal, Conjunctiva are pink and non-injected, sclera clear OROPHARYNX:no exudate, no erythema and lips, buccal mucosa, and tongue normal  NECK: supple, thyroid normal size, non-tender, without nodularity LYMPH:  no palpable lymphadenopathy in the cervical, axillary or inguinal LUNGS: clear to auscultation and percussion with normal breathing effort HEART: regular rate & rhythm and no murmurs and no lower extremity edema ABDOMEN:abdomen soft, non-tender and normal bowel sounds MUSCULOSKELETAL:no cyanosis of digits and no clubbing  NEURO: alert & oriented x 3 with fluent speech, no focal motor/sensory deficits EXTREMITIES: No lower extremity edema  LABORATORY DATA:  I have reviewed the data as listed   Chemistry      Component Value Date/Time   NA 141 01/19/2016 1020   NA 144  12/17/2015 0810   K 4.5 01/19/2016 1020   K 4.4 12/17/2015 0810   CL 105 01/19/2016 1020   CO2 29 01/19/2016 1020   CO2 25 12/17/2015 0810   BUN 13 01/19/2016 1020   BUN 17.0 12/17/2015 0810   CREATININE 0.64 01/19/2016 1020   CREATININE 0.8 12/17/2015 0810      Component Value Date/Time   CALCIUM 9.9 01/19/2016 1020   CALCIUM 9.8 12/17/2015 0810   ALKPHOS 93 01/19/2016 1020   ALKPHOS 104 12/17/2015 0810   AST 28 01/19/2016 1020   AST 24 12/17/2015 0810   ALT 25 01/19/2016 1020   ALT 23 12/17/2015 0810   BILITOT 0.6 01/19/2016 1020   BILITOT 0.59 12/17/2015 0810       Lab Results  Component Value Date   WBC 4.7 01/19/2016   HGB 12.7 01/19/2016   HCT 38.4 01/19/2016   MCV 86.3 01/19/2016   PLT 191 01/19/2016   NEUTROABS 2.8  01/19/2016    ASSESSMENT & PLAN:  Breast cancer of lower-outer quadrant of left female breast ([place]) Left lumpectomy 01/22/2016: IDC grade 1, 2.1 cm, with intermediate grade DCIS, LVI present, PNI present, 0/2 lymph nodes negative, ER 95%, PR 90%, HER-2 negative ratio 1.28, Ki-67 15%, T2 N0 stage II a Oncotype DX score 23, 15% risk of recurrence  Adjuvant radiation therapy from 03/11/2016 to 04/07/2016  Treatment plan: Anastrozole 1 mg by mouth daily Anastrozole Toxicities: 1. Left hip pain 2. intermittent hot flashes  Surveillance:  1. Breast exam 08/05/16: No palpable concerns 2. Mammogram to be done in May 2018  Prior history of ovarian malignancy: Patient follows with her gynecologist for the surveillance with CA-125's.  Return to clinic in 6 months for follow-up  I spent 15 minutes talking to the patient of which more than half was spent in counseling and coordination of care.  No orders of the defined types were placed in this encounter.  The patient has a good understanding of the overall plan. she agrees with it. she will call with any problems that may develop before the next visit here.   Rulon Eisenmenger, MD 08/05/16

## 2016-09-01 DIAGNOSIS — R1310 Dysphagia, unspecified: Secondary | ICD-10-CM | POA: Diagnosis not present

## 2016-09-06 DIAGNOSIS — Z853 Personal history of malignant neoplasm of breast: Secondary | ICD-10-CM | POA: Diagnosis not present

## 2016-10-06 DIAGNOSIS — K449 Diaphragmatic hernia without obstruction or gangrene: Secondary | ICD-10-CM | POA: Diagnosis not present

## 2016-10-06 DIAGNOSIS — R1310 Dysphagia, unspecified: Secondary | ICD-10-CM | POA: Diagnosis not present

## 2016-11-09 DIAGNOSIS — L570 Actinic keratosis: Secondary | ICD-10-CM | POA: Diagnosis not present

## 2016-11-09 DIAGNOSIS — I781 Nevus, non-neoplastic: Secondary | ICD-10-CM | POA: Diagnosis not present

## 2016-11-22 DIAGNOSIS — N3020 Other chronic cystitis without hematuria: Secondary | ICD-10-CM | POA: Diagnosis not present

## 2016-11-22 DIAGNOSIS — N3941 Urge incontinence: Secondary | ICD-10-CM | POA: Diagnosis not present

## 2017-02-01 ENCOUNTER — Ambulatory Visit (HOSPITAL_BASED_OUTPATIENT_CLINIC_OR_DEPARTMENT_OTHER): Payer: Medicare Other | Admitting: Hematology and Oncology

## 2017-02-01 ENCOUNTER — Encounter: Payer: Self-pay | Admitting: Hematology and Oncology

## 2017-02-01 DIAGNOSIS — Z8543 Personal history of malignant neoplasm of ovary: Secondary | ICD-10-CM | POA: Diagnosis not present

## 2017-02-01 DIAGNOSIS — C50512 Malignant neoplasm of lower-outer quadrant of left female breast: Secondary | ICD-10-CM | POA: Diagnosis not present

## 2017-02-01 DIAGNOSIS — N951 Menopausal and female climacteric states: Secondary | ICD-10-CM

## 2017-02-01 DIAGNOSIS — Z170 Estrogen receptor positive status [ER+]: Secondary | ICD-10-CM

## 2017-02-01 MED ORDER — ANASTROZOLE 1 MG PO TABS: 1.0000 mg | ORAL_TABLET | Freq: Every day | ORAL | 3 refills | Status: DC

## 2017-02-01 NOTE — Assessment & Plan Note (Signed)
Left lumpectomy 01/22/2016: IDC grade 1, 2.1 cm, with intermediate grade DCIS, LVI present, PNI present, 0/2 lymph nodes negative, ER 95%, PR 90%, HER-2 negative ratio 1.28, Ki-67 15%, T2 N0 stage II a Oncotype DX score 23, 15% risk of recurrence  Adjuvant radiation therapy from 03/11/2016 to 04/07/2016  Treatment plan: Anastrozole 1 mg by mouth daily started 04/2016 Anastrozole Toxicities: 1. Left hip pain 2. intermittent hot flashes  Surveillance:  1. Breast exam 02/01/2017: No palpable concerns 2. Mammogram was supposed to be done in May 2018. Needs to be scheduled  Prior history of ovarian malignancy: Patient follows with her gynecologist for the surveillance with CA-125's.  Return to clinic in 1 year for follow-up

## 2017-02-01 NOTE — Progress Notes (Signed)
Patient Care Team: Leanna Battles, [name], [name], [name], MD (Inactive) as Consulting Physician (Radiation Oncology)  DIAGNOSIS:  Encounter Diagnosis  Name Primary?  . Malignant neoplasm of lower-outer quadrant of left breast of female, estrogen receptor positive ([place])     SUMMARY OF ONCOLOGIC HISTORY:   Breast cancer of lower-outer quadrant of left female breast ([place])   12/11/2015 Initial Diagnosis    Left breast mass at 3:00: 1.7 x 1.6 x 1.2 cm, axilla negative, grade 2 IDC with DCIS, ER/PR positive HER-2 negative Ki-67 15%, T1 cN0 stage IA clinical stage      01/02/2016 Procedure    Genetic testing: VUS on POLD1 gene called c.3989G>A. Genes analyzed: APC, ATM, AXIN2, BAP1, BARD1, BMPR1A, BRCA1, BRCA2, BRIP1, CDH1, CDK4, CDKN2A, CHEK2, EPCAM, FANCC, MITF, MLH1, MSH2, MSH6, MUTYH, NBN, PALB2, PMS2, POLD1, POLE, PTEN, RAD51C, RAD51D, SCG5/GREM1, SMAD4, STK11, TP53, VHL, and XRCC2      01/22/2016 Surgery    Left lumpectomy ([name]): IDC grade 1, 2.1 cm, with intermediate grade DCIS, LVI present, PNI present, 0/2 lymph nodes negative, ER 95%, PR 90%, HER-2 negative ratio 1.28, Ki-67 15%, T2 N0 stage II a, Oncotype 23, 15% ROR      01/22/2016 Oncotype testing    Recurrence score: 23; ROR 15% (intermediate risk)       03/11/2016 - 04/07/2016 Radiation Therapy    Adjuvant radiation therapy [name]). Left breast: 42.5 Gy in 17 treatments. Left breast "boost": 7.5 Gy in 3 treatments.       04/2016 -  Anti-estrogen oral therapy    Anastrozole 1 mg daily. Planned duration of therapy: 5 years.        CHIEF COMPLIANT: Follow-up on anastrozole therapy  INTERVAL HISTORY: Katrina Young is a 76 year old with above-mentioned history of left breast cancer currently on anastrozole therapy. She is tolerating anastrozole extremely well. She has a  very occasional hot flashes. She is very busy with her garden and stays very active. She denies any lumps or nodules in the breast.  REVIEW OF SYSTEMS:   Constitutional: Denies fevers, chills or abnormal weight loss Eyes: Denies blurriness of vision Ears, nose, mouth, throat, and face: Denies mucositis or sore throat Respiratory: Denies cough, dyspnea or wheezes Cardiovascular: Denies palpitation, chest discomfort Gastrointestinal:  Denies nausea, heartburn or change in bowel habits Skin: Denies abnormal skin rashes Lymphatics: Denies new lymphadenopathy or easy bruising Neurological:Denies numbness, tingling or new weaknesses Behavioral/Psych: Mood is stable, no new changes  Extremities: No lower extremity edema Breast:  denies any pain or lumps or nodules in either breasts All other systems were reviewed with the patient and are negative.  I have reviewed the past medical history, past surgical history, social history and family history with the patient and they are unchanged from previous note.  ALLERGIES:  is allergic to codeine and morphine and related.  MEDICATIONS:  Current Outpatient Prescriptions  Medication Sig Dispense Refill  . anastrozole (ARIMIDEX) 1 MG tablet Take 1 tablet (1 mg total) by mouth daily. 90 tablet 3  . Ascorbic Acid (VITAMIN C) 100 MG tablet Take 100 mg by mouth daily.    Marland Kitchen aspirin EC 81 MG tablet Take 81 mg by mouth daily.    Marland Kitchen atenolol (TENORMIN) 50 MG tablet Take 50 mg by mouth every morning.     . Calcium-Vitamin D-Vitamin K (CALCIUM SOFT CHEWS PO) Take by mouth.    Marland Kitchen  cholecalciferol (VITAMIN D) 1000 UNITS tablet Take 1,000 Units by mouth daily.    . esomeprazole (NEXIUM) 40 MG capsule Take 40 mg by mouth daily before breakfast.     . ibuprofen (ADVIL,MOTRIN) 200 MG tablet Take 200 mg by mouth every 6 (six) hours as needed.    . LORazepam (ATIVAN) 1 MG tablet Take 1 mg by mouth 2 (two) times daily as needed for anxiety.     . losartan (COZAAR) 100 MG  tablet Take 100 mg by mouth daily.     . Misc Natural Products (OSTEO BI-FLEX/5-LOXIN ADVANCED PO) Take by mouth.    . Multiple Vitamin (MULTIVITAMIN WITH MINERALS) TABS tablet Take 1 tablet by mouth daily.    . Omega 3 1000 MG CAPS Take 1 capsule by mouth daily.    . Phenazopyridine HCl (AZO TABS PO) Take 2 tablets by mouth daily. Pt takes as needed when she has a flare-up    . pravastatin (PRAVACHOL) 20 MG tablet Take 20 mg by mouth daily.     . vitamin B-12 (CYANOCOBALAMIN) 1000 MCG tablet Take 1,000 mcg by mouth daily.     No current facility-administered medications for this visit.     PHYSICAL EXAMINATION: ECOG PERFORMANCE STATUS: 1 - Symptomatic but completely ambulatory  Vitals:   02/01/17 1201  BP: (!) 152/58  Pulse: (!) 57  Resp: 18  Temp: 97.6 F (36.4 C)   Filed Weights   02/01/17 1201  Weight: 168 lb 4.8 oz (76.3 kg)    GENERAL:alert, no distress and comfortable SKIN: skin color, texture, turgor are normal, no rashes or significant lesions EYES: normal, Conjunctiva are pink and non-injected, sclera clear OROPHARYNX:no exudate, no erythema and lips, buccal mucosa, and tongue normal  NECK: supple, thyroid normal size, non-tender, without nodularity LYMPH:  no palpable lymphadenopathy in the cervical, axillary or inguinal LUNGS: clear to auscultation and percussion with normal breathing effort HEART: regular rate & rhythm and no murmurs and no lower extremity edema ABDOMEN:abdomen soft, non-tender and normal bowel sounds MUSCULOSKELETAL:no cyanosis of digits and no clubbing  NEURO: alert & oriented x 3 with fluent speech, no focal motor/sensory deficits EXTREMITIES: No lower extremity edema BREAST: No palpable masses or nodules in either right or left breasts. No palpable axillary supraclavicular or infraclavicular adenopathy no breast tenderness or nipple discharge. (exam performed in the presence of a chaperone)  LABORATORY DATA:  I have reviewed the data as  listed   Chemistry      Component Value Date/Time   NA 141 01/19/2016 1020   NA 144 12/17/2015 0810   K 4.5 01/19/2016 1020   K 4.4 12/17/2015 0810   CL 105 01/19/2016 1020   CO2 29 01/19/2016 1020   CO2 25 12/17/2015 0810   BUN 13 01/19/2016 1020   BUN 17.0 12/17/2015 0810   CREATININE 0.64 01/19/2016 1020   CREATININE 0.8 12/17/2015 0810      Component Value Date/Time   CALCIUM 9.9 01/19/2016 1020   CALCIUM 9.8 12/17/2015 0810   ALKPHOS 93 01/19/2016 1020   ALKPHOS 104 12/17/2015 0810   AST 28 01/19/2016 1020   AST 24 12/17/2015 0810   ALT 25 01/19/2016 1020   ALT 23 12/17/2015 0810   BILITOT 0.6 01/19/2016 1020   BILITOT 0.59 12/17/2015 0810       Lab Results  Component Value Date   WBC 4.7 01/19/2016   HGB 12.7 01/19/2016   HCT 38.4 01/19/2016   MCV 86.3 01/19/2016   PLT 191 01/19/2016     NEUTROABS 2.8 01/19/2016    ASSESSMENT & PLAN:  Breast cancer of lower-outer quadrant of left female breast ([facility]) Left lumpectomy 01/22/2016: IDC grade 1, 2.1 cm, with intermediate grade DCIS, LVI present, PNI present, 0/2 lymph nodes negative, ER 95%, PR 90%, HER-2 negative ratio 1.28, Ki-67 15%, T2 N0 stage II a Oncotype DX score 23, 15% risk of recurrence  Adjuvant radiation therapy from 03/11/2016 to 04/07/2016  Treatment plan: Anastrozole 1 mg by mouth daily started 04/2016 Anastrozole Toxicities: 1. Left hip pain: Resolved 2. intermittent hot flashes  Surveillance:  1. Breast exam 02/01/2017: No palpable concerns 2. Mammogram was supposed to be done in May 2018. Needs to be scheduled  Prior history of ovarian malignancy: Patient follows with her gynecologist for the surveillance with CA-125's.  Return to clinic in 1 year for follow-up   I spent 25 minutes talking to the patient of which more than half was spent in counseling and coordination of care.  No orders of the defined types were placed in this encounter.  The patient has a good understanding of  the overall plan. she agrees with it. she will call with any problems that may develop before the next visit here.   Gudena, Vinay K, MD 02/01/17    

## 2017-02-03 DIAGNOSIS — I10 Essential (primary) hypertension: Secondary | ICD-10-CM | POA: Diagnosis not present

## 2017-02-03 DIAGNOSIS — E784 Other hyperlipidemia: Secondary | ICD-10-CM | POA: Diagnosis not present

## 2017-02-03 DIAGNOSIS — M859 Disorder of bone density and structure, unspecified: Secondary | ICD-10-CM | POA: Diagnosis not present

## 2017-02-10 DIAGNOSIS — R030 Elevated blood-pressure reading, without diagnosis of hypertension: Secondary | ICD-10-CM | POA: Diagnosis not present

## 2017-02-10 DIAGNOSIS — M859 Disorder of bone density and structure, unspecified: Secondary | ICD-10-CM | POA: Diagnosis not present

## 2017-02-10 DIAGNOSIS — C569 Malignant neoplasm of unspecified ovary: Secondary | ICD-10-CM | POA: Diagnosis not present

## 2017-02-10 DIAGNOSIS — E784 Other hyperlipidemia: Secondary | ICD-10-CM | POA: Diagnosis not present

## 2017-02-10 DIAGNOSIS — Z1212 Encounter for screening for malignant neoplasm of rectum: Secondary | ICD-10-CM | POA: Diagnosis not present

## 2017-02-10 DIAGNOSIS — G4709 Other insomnia: Secondary | ICD-10-CM | POA: Diagnosis not present

## 2017-02-10 DIAGNOSIS — Z1389 Encounter for screening for other disorder: Secondary | ICD-10-CM | POA: Diagnosis not present

## 2017-02-10 DIAGNOSIS — C50912 Malignant neoplasm of unspecified site of left female breast: Secondary | ICD-10-CM | POA: Diagnosis not present

## 2017-02-10 DIAGNOSIS — Z6829 Body mass index (BMI) 29.0-29.9, adult: Secondary | ICD-10-CM | POA: Diagnosis not present

## 2017-02-10 DIAGNOSIS — Z0000 Encounter for general adult medical examination without abnormal findings: Secondary | ICD-10-CM | POA: Diagnosis not present

## 2017-02-23 DIAGNOSIS — K921 Melena: Secondary | ICD-10-CM | POA: Diagnosis not present

## 2017-03-21 DIAGNOSIS — R300 Dysuria: Secondary | ICD-10-CM | POA: Diagnosis not present

## 2017-03-21 DIAGNOSIS — N390 Urinary tract infection, site not specified: Secondary | ICD-10-CM | POA: Diagnosis not present

## 2017-04-07 DIAGNOSIS — K921 Melena: Secondary | ICD-10-CM | POA: Diagnosis not present

## 2017-04-23 DIAGNOSIS — Z23 Encounter for immunization: Secondary | ICD-10-CM | POA: Diagnosis not present

## 2017-07-13 DIAGNOSIS — Z6829 Body mass index (BMI) 29.0-29.9, adult: Secondary | ICD-10-CM | POA: Diagnosis not present

## 2017-07-13 DIAGNOSIS — M8588 Other specified disorders of bone density and structure, other site: Secondary | ICD-10-CM | POA: Diagnosis not present

## 2017-07-13 DIAGNOSIS — Z779 Other contact with and (suspected) exposures hazardous to health: Secondary | ICD-10-CM | POA: Diagnosis not present

## 2017-07-13 DIAGNOSIS — Z8543 Personal history of malignant neoplasm of ovary: Secondary | ICD-10-CM | POA: Diagnosis not present

## 2017-07-13 DIAGNOSIS — N958 Other specified menopausal and perimenopausal disorders: Secondary | ICD-10-CM | POA: Diagnosis not present

## 2017-07-13 DIAGNOSIS — Z01419 Encounter for gynecological examination (general) (routine) without abnormal findings: Secondary | ICD-10-CM | POA: Diagnosis not present

## 2017-07-30 IMAGING — US US BREAST*L* LIMITED INC AXILLA
1 series · 10 of 10 positions shown · non-contrast
Comparison: 05/29/2015, 05/20/2015, 05/16/2014, 05/15/2013.

CLINICAL DATA: Six month follow-up evaluation of left breast
finding.

EXAM:
2D DIGITAL DIAGNOSTIC LEFT MAMMOGRAM WITH CAD AND ADJUNCT TOMO
ULTRASOUND LEFT BREAST

[Series 1: us breast*left* limited inc axilla · 0.07mm/px · 10 of 10 slices shown]
[im 1/10]
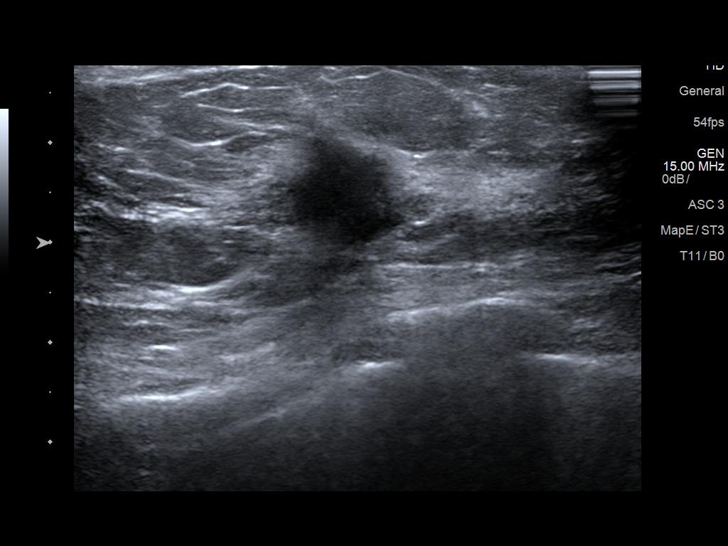
[im 2/10]
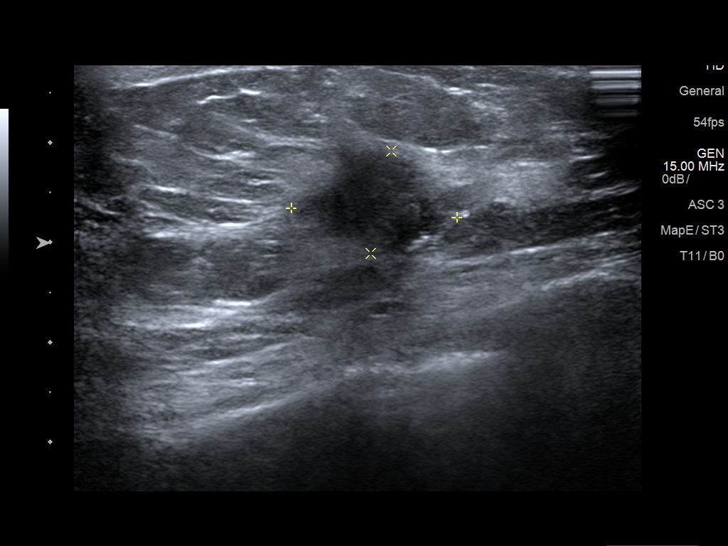
[im 3/10]
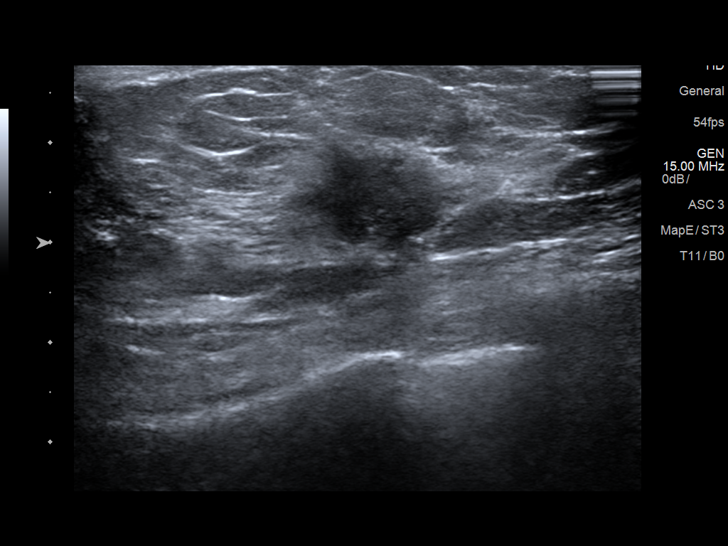
[im 4/10]
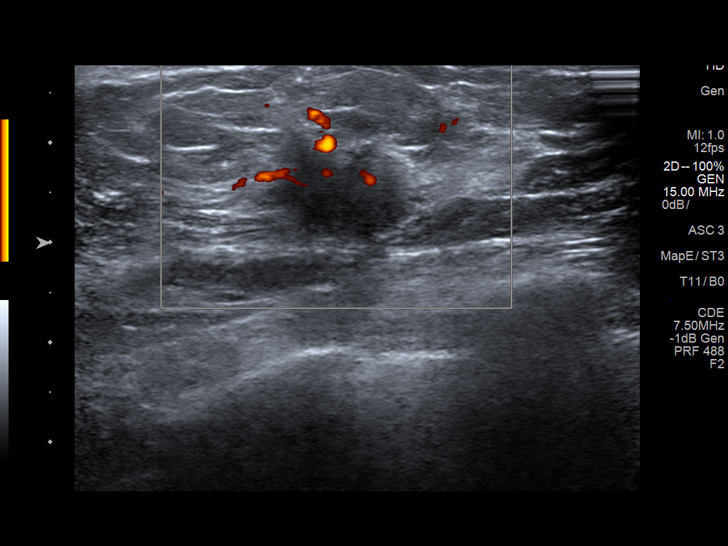
[im 5/10]
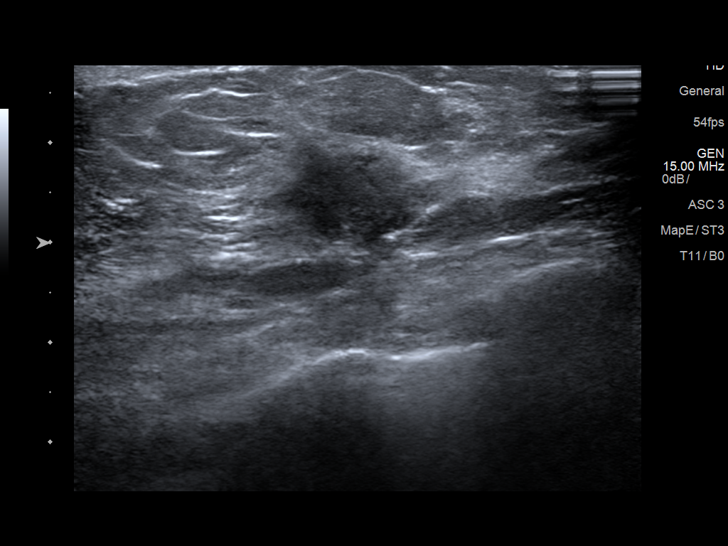
[im 6/10]
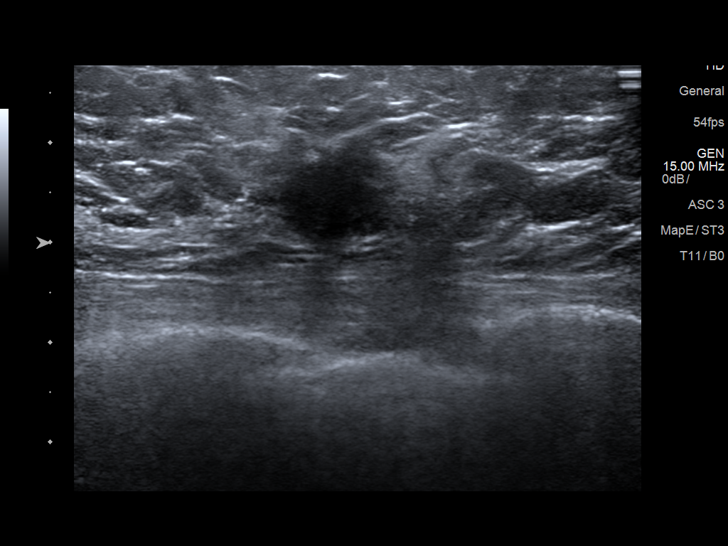
[im 7/10]
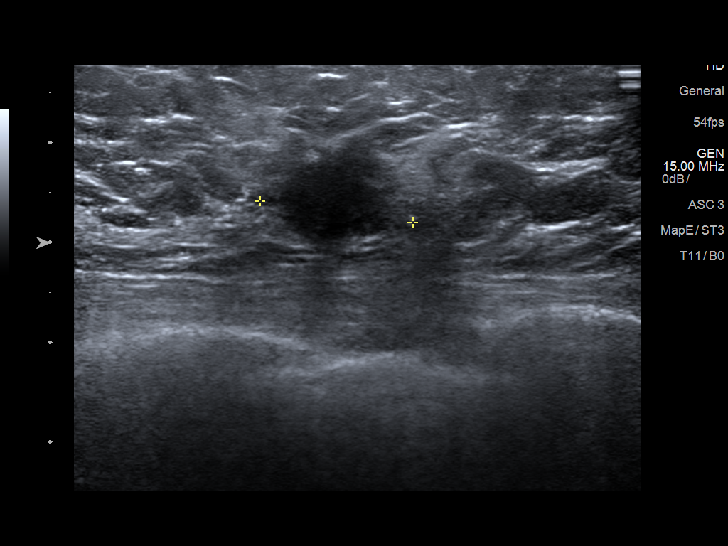
[im 8/10]
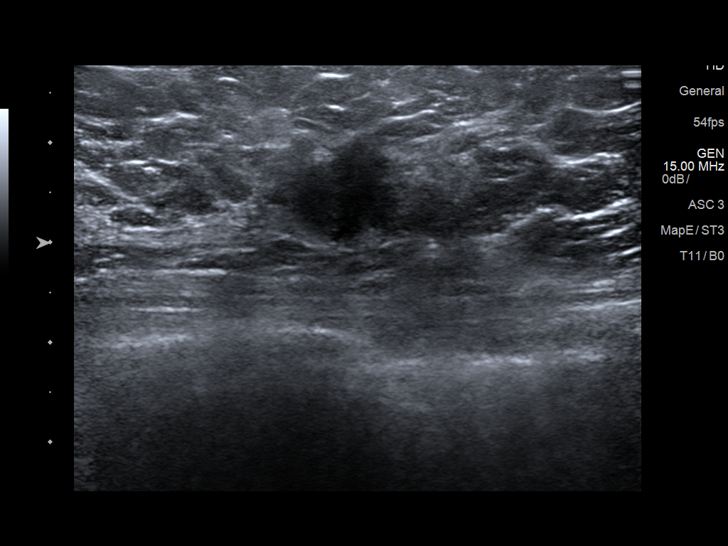
[im 9/10]
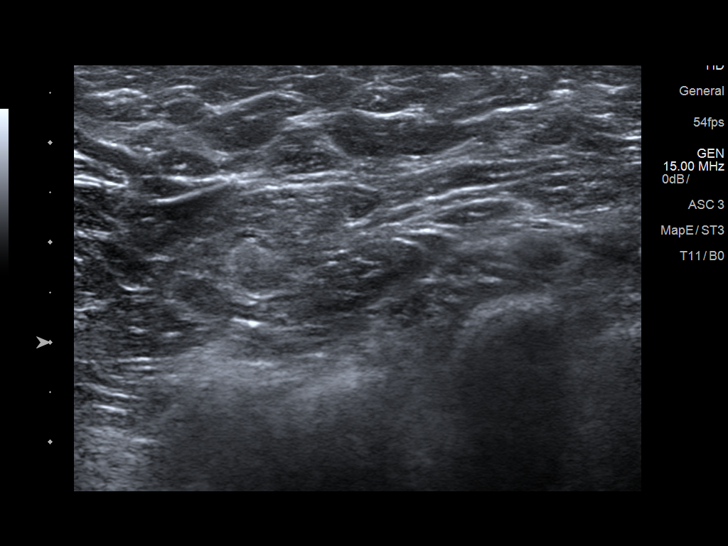
[im 10/10]
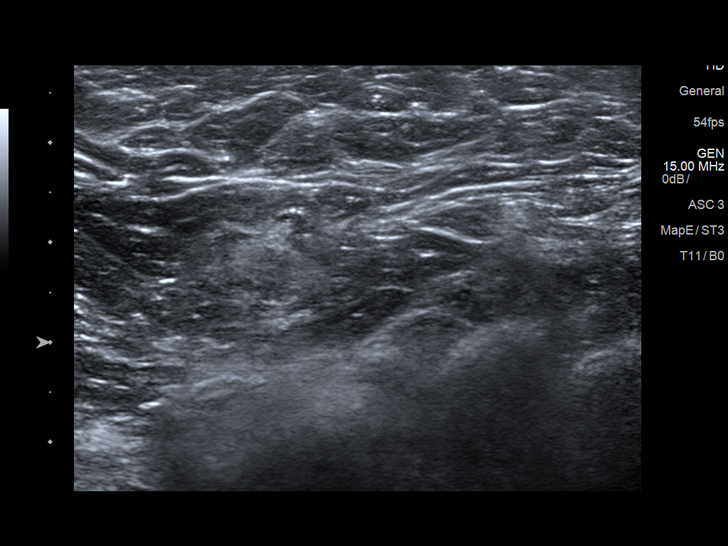

[10 of 10 positions shown; findings below may reference images not displayed]

ACR Breast Density Category c: The breast tissue is heterogeneously
dense, which may obscure small masses.
FINDINGS: There is a spiculated mass present located within the upper-outer
quadrant of the left breast. There are numerous punctate
calcifications present throughout the left breast which appear
stable compared with prior studies.

Mammographic images were processed with CAD.

On physical exam, there is a firm palpable mass located within the
left breast at the 3:30 o'clock position 3 cm from the nipple. By
physical examination this measures approximately 1.5 cm in size.
There is no palpable left axillary adenopathy.

Targeted ultrasound is performed, showing an irregularly marginated
hypoechoic mass located within the left breast at 3:30 o'clock
position 3 cm from the nipple corresponding to the mammographic and
palpable finding. This measures 1.7 x 1.6 x 1.1 cm in size and is
associated with mild increased vascularity. Tissue sampling via
ultrasound-guided core biopsy is recommended.

Ultrasound of the left axilla demonstrates normal axillary contents
and no evidence for adenopathy.
IMPRESSION: 1.7 cm irregularly marginated mass located within the left breast at
3:30 o'clock position 3 cm from the nipple. Tissue sampling is
recommended and ultrasound-guided core biopsy is currently scheduled
for 12/09/2015 at 1 a.m..

RECOMMENDATION:
Left breast ultrasound-guided core biopsy.

I have discussed the findings and recommendations with the patient.
Results were also provided in writing at the conclusion of the
visit. If applicable, a reminder letter will be sent to the patient
regarding the next appointment.

BI-RADS CATEGORY  5: Highly suggestive of malignancy.

## 2017-08-23 DIAGNOSIS — M65332 Trigger finger, left middle finger: Secondary | ICD-10-CM | POA: Diagnosis not present

## 2017-08-23 DIAGNOSIS — M654 Radial styloid tenosynovitis [de Quervain]: Secondary | ICD-10-CM | POA: Diagnosis not present

## 2017-09-16 DIAGNOSIS — Z853 Personal history of malignant neoplasm of breast: Secondary | ICD-10-CM | POA: Diagnosis not present

## 2017-09-21 ENCOUNTER — Other Ambulatory Visit: Payer: Self-pay | Admitting: Surgery

## 2017-09-21 DIAGNOSIS — Z853 Personal history of malignant neoplasm of breast: Secondary | ICD-10-CM

## 2017-09-27 DIAGNOSIS — M654 Radial styloid tenosynovitis [de Quervain]: Secondary | ICD-10-CM | POA: Diagnosis not present

## 2017-09-28 ENCOUNTER — Ambulatory Visit
Admission: RE | Admit: 2017-09-28 | Discharge: 2017-09-28 | Disposition: A | Discharge status: Home / Self Care | Payer: Medicare Other | Source: Ambulatory Visit | Attending: Surgery | Admitting: Surgery

## 2017-09-28 DIAGNOSIS — Z853 Personal history of malignant neoplasm of breast: Secondary | ICD-10-CM

## 2017-09-28 DIAGNOSIS — R922 Inconclusive mammogram: Secondary | ICD-10-CM | POA: Diagnosis not present

## 2017-10-25 DIAGNOSIS — M654 Radial styloid tenosynovitis [de Quervain]: Secondary | ICD-10-CM | POA: Diagnosis not present

## 2017-10-25 DIAGNOSIS — M65332 Trigger finger, left middle finger: Secondary | ICD-10-CM | POA: Diagnosis not present

## 2017-10-26 DIAGNOSIS — H524 Presbyopia: Secondary | ICD-10-CM | POA: Diagnosis not present

## 2017-10-26 DIAGNOSIS — H5203 Hypermetropia, bilateral: Secondary | ICD-10-CM | POA: Diagnosis not present

## 2017-10-26 DIAGNOSIS — H43811 Vitreous degeneration, right eye: Secondary | ICD-10-CM | POA: Diagnosis not present

## 2017-10-26 DIAGNOSIS — H2513 Age-related nuclear cataract, bilateral: Secondary | ICD-10-CM | POA: Diagnosis not present

## 2017-10-26 DIAGNOSIS — H11041 Peripheral pterygium, stationary, right eye: Secondary | ICD-10-CM | POA: Diagnosis not present

## 2017-11-14 DIAGNOSIS — L814 Other melanin hyperpigmentation: Secondary | ICD-10-CM | POA: Diagnosis not present

## 2017-11-14 DIAGNOSIS — L570 Actinic keratosis: Secondary | ICD-10-CM | POA: Diagnosis not present

## 2017-11-14 DIAGNOSIS — D225 Melanocytic nevi of trunk: Secondary | ICD-10-CM | POA: Diagnosis not present

## 2017-11-28 DIAGNOSIS — L7211 Pilar cyst: Secondary | ICD-10-CM | POA: Diagnosis not present

## 2017-11-28 DIAGNOSIS — D485 Neoplasm of uncertain behavior of skin: Secondary | ICD-10-CM | POA: Diagnosis not present

## 2017-11-28 DIAGNOSIS — R208 Other disturbances of skin sensation: Secondary | ICD-10-CM | POA: Diagnosis not present

## 2017-12-12 DIAGNOSIS — L578 Other skin changes due to chronic exposure to nonionizing radiation: Secondary | ICD-10-CM | POA: Diagnosis not present

## 2017-12-12 DIAGNOSIS — L570 Actinic keratosis: Secondary | ICD-10-CM | POA: Diagnosis not present

## 2017-12-12 DIAGNOSIS — L720 Epidermal cyst: Secondary | ICD-10-CM | POA: Diagnosis not present

## 2017-12-23 ENCOUNTER — Telehealth: Payer: Self-pay | Admitting: Hematology and Oncology

## 2017-12-23 NOTE — Telephone Encounter (Signed)
Spoke w/ pt.  Rescheduled appt with VG from 7/10 to 7/12 @ 11:15 am.

## 2018-02-01 ENCOUNTER — Ambulatory Visit: Payer: Medicare Other | Admitting: Hematology and Oncology

## 2018-02-03 ENCOUNTER — Telehealth: Payer: Self-pay | Admitting: Hematology and Oncology

## 2018-02-03 ENCOUNTER — Inpatient Hospital Stay: Payer: Medicare Other | Attending: Hematology and Oncology | Admitting: Hematology and Oncology

## 2018-02-03 DIAGNOSIS — Z79811 Long term (current) use of aromatase inhibitors: Secondary | ICD-10-CM | POA: Diagnosis not present

## 2018-02-03 DIAGNOSIS — C50512 Malignant neoplasm of lower-outer quadrant of left female breast: Secondary | ICD-10-CM | POA: Insufficient documentation

## 2018-02-03 DIAGNOSIS — Z923 Personal history of irradiation: Secondary | ICD-10-CM | POA: Diagnosis not present

## 2018-02-03 DIAGNOSIS — Z170 Estrogen receptor positive status [ER+]: Secondary | ICD-10-CM | POA: Diagnosis not present

## 2018-02-03 DIAGNOSIS — Z79899 Other long term (current) drug therapy: Secondary | ICD-10-CM | POA: Diagnosis not present

## 2018-02-03 DIAGNOSIS — Z7982 Long term (current) use of aspirin: Secondary | ICD-10-CM

## 2018-02-03 MED ORDER — ATENOLOL 100 MG PO TABS: 100.0000 mg | ORAL_TABLET | Freq: Every morning | ORAL | Status: AC

## 2018-02-03 NOTE — Assessment & Plan Note (Signed)
Left lumpectomy 01/22/2016: IDC grade 1, 2.1 cm, with intermediate grade DCIS, LVI present, PNI present, 0/2 lymph nodes negative, ER 95%, PR 90%, HER-2 negative ratio 1.28, Ki-67 15%, T2 N0 stage II a Oncotype DX score 23, 15% risk of recurrence  Adjuvant radiation therapy from 03/11/2016 to 04/07/2016  Treatment plan: Anastrozole 1 mg by mouth daily started 04/2016  Anastrozole Toxicities: 1. Left hip pain: Resolved 2. intermittent hot flashes  Surveillance:  1. Breast exam  02/03/2018: No evidence of malignancy. 2. Mammogram 09/28/2017: Benign breast density category C  Prior history of ovarian malignancy: Patient follows with her gynecologist for the surveillance with CA-125's.  Return to clinic in 1 year for follow-up

## 2018-02-03 NOTE — Progress Notes (Signed)
Patient Care Team: Leanna Battles, [name], [name], [name], MD as Consulting Physician (Radiation Oncology)  DIAGNOSIS:  Encounter Diagnosis  Name Primary?  . Malignant neoplasm of lower-outer quadrant of left breast of female, estrogen receptor positive ([place])     SUMMARY OF ONCOLOGIC HISTORY:   Breast cancer of lower-outer quadrant of left female breast ([place])   12/11/2015 Initial Diagnosis    Left breast mass at 3:00: 1.7 x 1.6 x 1.2 cm, axilla negative, grade 2 IDC with DCIS, ER/PR positive HER-2 negative Ki-67 15%, T1 cN0 stage IA clinical stage      01/02/2016 Procedure    Genetic testing: VUS on POLD1 gene called c.3989G>A. Genes analyzed: APC, ATM, AXIN2, BAP1, BARD1, BMPR1A, BRCA1, BRCA2, BRIP1, CDH1, CDK4, CDKN2A, CHEK2, EPCAM, FANCC, MITF, MLH1, MSH2, MSH6, MUTYH, NBN, PALB2, PMS2, POLD1, POLE, PTEN, RAD51C, RAD51D, SCG5/GREM1, SMAD4, STK11, TP53, VHL, and XRCC2      01/22/2016 Surgery    Left lumpectomy ([name]): IDC grade 1, 2.1 cm, with intermediate grade DCIS, LVI present, PNI present, 0/2 lymph nodes negative, ER 95%, PR 90%, HER-2 negative ratio 1.28, Ki-67 15%, T2 N0 stage II a, Oncotype 23, 15% ROR      01/22/2016 Oncotype testing    Recurrence score: 23; ROR 15% (intermediate risk)       03/11/2016 - 04/07/2016 Radiation Therapy    Adjuvant radiation therapy Katrina Young). Left breast: 42.5 Gy in 17 treatments. Left breast "boost": 7.5 Gy in 3 treatments.       04/2016 -  Anti-estrogen oral therapy    Anastrozole 1 mg daily. Planned duration of therapy: 5 years.        CHIEF COMPLIANT: Follow-up on anastrozole therapy  INTERVAL HISTORY: Katrina Young is a 77 year old with above-mentioned history of left breast cancer currently on anastrozole therapy and appears to be tolerating extremely well.  She reports that her  fingers appeared to be stiffened out.  Sometimes she has a trigger finger.  She has seen orthopedic specialist for this and they recommended some surgical approaches but she is not willing to do it.  REVIEW OF SYSTEMS:   Constitutional: Denies fevers, chills or abnormal weight loss Eyes: Denies blurriness of vision Ears, nose, mouth, throat, and face: Denies mucositis or sore throat Respiratory: Denies cough, dyspnea or wheezes Cardiovascular: Denies palpitation, chest discomfort Gastrointestinal:  Denies nausea, heartburn or change in bowel habits Skin: Denies abnormal skin rashes Lymphatics: Denies new lymphadenopathy or easy bruising Neurological:Denies numbness, tingling or new weaknesses Behavioral/Psych: Mood is stable, no new changes  Extremities: Trigger finger Breast:  denies any pain or lumps or nodules in either breasts All other systems were reviewed with the patient and are negative.  I have reviewed the past medical history, past surgical history, social history and family history with the patient and they are unchanged from previous note.  ALLERGIES:  is allergic to codeine and morphine and related.  MEDICATIONS:  Current Outpatient Medications  Medication Sig Dispense Refill  . anastrozole (ARIMIDEX) 1 MG tablet Take 1 tablet (1 mg total) by mouth daily. 90 tablet 3  . Ascorbic Acid (VITAMIN C) 100 MG tablet Take 100 mg by mouth daily.    Marland Kitchen aspirin EC 81 MG tablet Take 81 mg by mouth daily.    Marland Kitchen atenolol (TENORMIN) 100 MG tablet Take 1 tablet (100 mg total) by mouth every morning.    Marland Kitchen  Calcium-Vitamin D-Vitamin K (CALCIUM SOFT CHEWS PO) Take by mouth.    . cholecalciferol (VITAMIN D) 1000 UNITS tablet Take 1,000 Units by mouth daily.    . esomeprazole (NEXIUM) 40 MG capsule Take 40 mg by mouth daily before breakfast.     . ibuprofen (ADVIL,MOTRIN) 200 MG tablet Take 200 mg by mouth every 6 (six) hours as needed.    . LORazepam (ATIVAN) 1 MG tablet Take 1 mg by mouth 2  (two) times daily as needed for anxiety.     . Misc Natural Products (OSTEO BI-FLEX/5-LOXIN ADVANCED PO) Take by mouth.    . Multiple Vitamin (MULTIVITAMIN WITH MINERALS) TABS tablet Take 1 tablet by mouth daily.    . Omega 3 1000 MG CAPS Take 1 capsule by mouth daily.    . Phenazopyridine HCl (AZO TABS PO) Take 2 tablets by mouth daily. Pt takes as needed when she has a flare-up    . pravastatin (PRAVACHOL) 20 MG tablet Take 20 mg by mouth daily.     . vitamin B-12 (CYANOCOBALAMIN) 1000 MCG tablet Take 1,000 mcg by mouth daily.     No current facility-administered medications for this visit.     PHYSICAL EXAMINATION: ECOG PERFORMANCE STATUS: 1 - Symptomatic but completely ambulatory  Vitals:   02/03/18 1127  BP: (!) 159/64  Pulse: (!) 50  Resp: 17  Temp: 97.8 F (36.6 C)  SpO2: 97%   Filed Weights   02/03/18 1127  Weight: 167 lb 3.2 oz (75.8 kg)    GENERAL:alert, no distress and comfortable SKIN: skin color, texture, turgor are normal, no rashes or significant lesions EYES: normal, Conjunctiva are pink and non-injected, sclera clear OROPHARYNX:no exudate, no erythema and lips, buccal mucosa, and tongue normal  NECK: supple, thyroid normal size, non-tender, without nodularity LYMPH:  no palpable lymphadenopathy in the cervical, axillary or inguinal LUNGS: clear to auscultation and percussion with normal breathing effort HEART: regular rate & rhythm and no murmurs and no lower extremity edema ABDOMEN:abdomen soft, non-tender and normal bowel sounds MUSCULOSKELETAL:no cyanosis of digits and no clubbing  NEURO: alert & oriented x 3 with fluent speech, no focal motor/sensory deficits EXTREMITIES: No lower extremity edema BREAST: No palpable masses or nodules in either right or left breasts. No palpable axillary supraclavicular or infraclavicular adenopathy no breast tenderness or nipple discharge. (exam performed in the presence of a chaperone)  LABORATORY DATA:  I have  reviewed the data as listed CMP Latest Ref Rng & Units 01/19/2016 12/17/2015 07/15/2014  Glucose 65 - 99 mg/dL 109(H) 109 102(H)  BUN 6 - 20 mg/dL 13 17.0 14  Creatinine 0.44 - 1.00 mg/dL 0.64 0.8 0.72  Sodium 135 - 145 mmol/L 141 144 147  Potassium 3.5 - 5.1 mmol/L 4.5 4.4 5.1  Chloride 101 - 111 mmol/L 105 - 106  CO2 22 - 32 mmol/L 29 25 26  Calcium 8.9 - 10.3 mg/dL 9.9 9.8 10.3  Total Protein 6.5 - 8.1 g/dL 6.5 7.4 -  Total Bilirubin 0.3 - 1.2 mg/dL 0.6 0.59 -  Alkaline Phos 38 - 126 U/L 93 104 -  AST 15 - 41 U/L 28 24 -  ALT 14 - 54 U/L 25 23 -    Lab Results  Component Value Date   WBC 4.7 01/19/2016   HGB 12.7 01/19/2016   HCT 38.4 01/19/2016   MCV 86.3 01/19/2016   PLT 191 01/19/2016   NEUTROABS 2.8 01/19/2016    ASSESSMENT & PLAN:  Breast cancer of lower-outer quadrant of   left female breast [facility]) Left lumpectomy 01/22/2016: IDC grade 1, 2.1 cm, with intermediate grade DCIS, LVI present, PNI present, 0/2 lymph nodes negative, ER 95%, PR 90%, HER-2 negative ratio 1.28, Ki-67 15%, T2 N0 stage II a Oncotype DX score 23, 15% risk of recurrence  Adjuvant radiation therapy from 03/11/2016 to 04/07/2016  Treatment plan: Anastrozole 1 mg by mouth daily started 04/2016  Anastrozole Toxicities:  muscle stiffness and cramping in the fingers along with trigger finger: I discussed with her that she could stop anastrozole for 2 weeks and see if her symptoms get better.  If the symptoms do get better than we can switch her to letrozole.  If they do not get better than she will go ahead with starting back on anastrozole therapy.    Surveillance:  1. Breast exam  02/03/2018: No evidence of malignancy. 2. Mammogram 09/28/2017: Benign breast density category C  Prior history of ovarian malignancy: Patient follows with her gynecologist for the surveillance with CA-125's.  Return to clinic in 1 year for follow-up      No orders of the defined types were placed in this  encounter.  The patient has a good understanding of the overall plan. she agrees with it. she will call with any problems that may develop before the next visit here.   Harriette Ohara, MD 02/03/18

## 2018-02-03 NOTE — Telephone Encounter (Signed)
Gave patient avs and calendar of upcoming July 2020 appts.  °

## 2018-02-17 ENCOUNTER — Telehealth: Payer: Self-pay

## 2018-02-17 NOTE — Telephone Encounter (Signed)
Returned pt's call regarding her last visit with Dr Lindi Adie and her anastrozole.  Pt reports she stopped the anastrozole for 2 weeks per Dr Lindi Adie for "muscle stiffness and cramping in the fingers along with trigger finger".  Pt reports her symptoms are not any better. Pt wanted to know if she should go back on anastrozole then.  Per Dr Lindi Adie from last note:  "If the symptoms do get better than we can switch her to letrozole.  If they do not get better than she will go ahead with starting back on anastrozole therapy."  Pt agreeable with and will restart anastrozole today.  Encouraged her to f/u with her hand doctor. She said she may consider having surgery for trigger surgery in Fall. Pt said she is taking Tylenol every other day and it helps.  Told her to keep in touch with [place] on how she feels, as she starts back on anastrozole. Pt voiced understanding.  No other needs per pt at this time.

## 2018-03-28 DIAGNOSIS — R3129 Other microscopic hematuria: Secondary | ICD-10-CM | POA: Diagnosis not present

## 2018-03-28 DIAGNOSIS — N390 Urinary tract infection, site not specified: Secondary | ICD-10-CM | POA: Diagnosis not present

## 2018-04-14 DIAGNOSIS — L570 Actinic keratosis: Secondary | ICD-10-CM | POA: Diagnosis not present

## 2018-04-14 DIAGNOSIS — L578 Other skin changes due to chronic exposure to nonionizing radiation: Secondary | ICD-10-CM | POA: Diagnosis not present

## 2018-04-15 ENCOUNTER — Other Ambulatory Visit: Payer: Self-pay | Admitting: Hematology and Oncology

## 2018-04-20 DIAGNOSIS — I10 Essential (primary) hypertension: Secondary | ICD-10-CM | POA: Diagnosis not present

## 2018-04-20 DIAGNOSIS — R82998 Other abnormal findings in urine: Secondary | ICD-10-CM | POA: Diagnosis not present

## 2018-04-20 DIAGNOSIS — E7849 Other hyperlipidemia: Secondary | ICD-10-CM | POA: Diagnosis not present

## 2018-04-20 DIAGNOSIS — M859 Disorder of bone density and structure, unspecified: Secondary | ICD-10-CM | POA: Diagnosis not present

## 2018-04-22 DIAGNOSIS — Z23 Encounter for immunization: Secondary | ICD-10-CM | POA: Diagnosis not present

## 2018-04-25 DIAGNOSIS — Z1212 Encounter for screening for malignant neoplasm of rectum: Secondary | ICD-10-CM | POA: Diagnosis not present

## 2018-04-27 DIAGNOSIS — R600 Localized edema: Secondary | ICD-10-CM | POA: Diagnosis not present

## 2018-04-27 DIAGNOSIS — Z1389 Encounter for screening for other disorder: Secondary | ICD-10-CM | POA: Diagnosis not present

## 2018-04-27 DIAGNOSIS — I10 Essential (primary) hypertension: Secondary | ICD-10-CM | POA: Diagnosis not present

## 2018-04-27 DIAGNOSIS — E7849 Other hyperlipidemia: Secondary | ICD-10-CM | POA: Diagnosis not present

## 2018-04-27 DIAGNOSIS — Z6829 Body mass index (BMI) 29.0-29.9, adult: Secondary | ICD-10-CM | POA: Diagnosis not present

## 2018-04-27 DIAGNOSIS — C50912 Malignant neoplasm of unspecified site of left female breast: Secondary | ICD-10-CM | POA: Diagnosis not present

## 2018-04-27 DIAGNOSIS — R740 Nonspecific elevation of levels of transaminase and lactic acid dehydrogenase [LDH]: Secondary | ICD-10-CM | POA: Diagnosis not present

## 2018-04-27 DIAGNOSIS — M859 Disorder of bone density and structure, unspecified: Secondary | ICD-10-CM | POA: Diagnosis not present

## 2018-04-27 DIAGNOSIS — Z0000 Encounter for general adult medical examination without abnormal findings: Secondary | ICD-10-CM | POA: Diagnosis not present

## 2018-05-23 DIAGNOSIS — L578 Other skin changes due to chronic exposure to nonionizing radiation: Secondary | ICD-10-CM | POA: Diagnosis not present

## 2018-07-25 DIAGNOSIS — Z01419 Encounter for gynecological examination (general) (routine) without abnormal findings: Secondary | ICD-10-CM | POA: Diagnosis not present

## 2018-07-25 DIAGNOSIS — Z6829 Body mass index (BMI) 29.0-29.9, adult: Secondary | ICD-10-CM | POA: Diagnosis not present

## 2018-08-03 ENCOUNTER — Other Ambulatory Visit: Payer: Self-pay | Admitting: Surgery

## 2018-08-03 DIAGNOSIS — Z853 Personal history of malignant neoplasm of breast: Secondary | ICD-10-CM

## 2018-08-17 DIAGNOSIS — M25561 Pain in right knee: Secondary | ICD-10-CM | POA: Diagnosis not present

## 2018-08-22 ENCOUNTER — Other Ambulatory Visit: Payer: Self-pay | Admitting: Orthopedic Surgery

## 2018-08-22 DIAGNOSIS — M25561 Pain in right knee: Secondary | ICD-10-CM

## 2018-08-22 DIAGNOSIS — R531 Weakness: Secondary | ICD-10-CM

## 2018-08-30 ENCOUNTER — Ambulatory Visit
Admission: RE | Admit: 2018-08-30 | Discharge: 2018-08-30 | Disposition: A | Discharge status: Home / Self Care | Payer: Medicare Other | Source: Ambulatory Visit | Attending: Orthopedic Surgery | Admitting: Orthopedic Surgery

## 2018-08-30 DIAGNOSIS — M23221 Derangement of posterior horn of medial meniscus due to old tear or injury, right knee: Secondary | ICD-10-CM | POA: Diagnosis not present

## 2018-08-30 DIAGNOSIS — R531 Weakness: Secondary | ICD-10-CM

## 2018-08-30 DIAGNOSIS — M25561 Pain in right knee: Secondary | ICD-10-CM

## 2018-08-30 DIAGNOSIS — N390 Urinary tract infection, site not specified: Secondary | ICD-10-CM | POA: Diagnosis not present

## 2018-08-30 DIAGNOSIS — R300 Dysuria: Secondary | ICD-10-CM | POA: Diagnosis not present

## 2018-09-07 DIAGNOSIS — S83241A Other tear of medial meniscus, current injury, right knee, initial encounter: Secondary | ICD-10-CM | POA: Diagnosis not present

## 2018-09-18 DIAGNOSIS — Z853 Personal history of malignant neoplasm of breast: Secondary | ICD-10-CM | POA: Diagnosis not present

## 2018-09-20 DIAGNOSIS — S83271A Complex tear of lateral meniscus, current injury, right knee, initial encounter: Secondary | ICD-10-CM | POA: Diagnosis not present

## 2018-09-20 DIAGNOSIS — X58XXXA Exposure to other specified factors, initial encounter: Secondary | ICD-10-CM | POA: Diagnosis not present

## 2018-09-20 DIAGNOSIS — Y999 Unspecified external cause status: Secondary | ICD-10-CM | POA: Diagnosis not present

## 2018-09-20 DIAGNOSIS — M6751 Plica syndrome, right knee: Secondary | ICD-10-CM | POA: Diagnosis not present

## 2018-09-20 DIAGNOSIS — M659 Synovitis and tenosynovitis, unspecified: Secondary | ICD-10-CM | POA: Diagnosis not present

## 2018-09-20 DIAGNOSIS — S83231A Complex tear of medial meniscus, current injury, right knee, initial encounter: Secondary | ICD-10-CM | POA: Diagnosis not present

## 2018-09-20 DIAGNOSIS — M94261 Chondromalacia, right knee: Secondary | ICD-10-CM | POA: Diagnosis not present

## 2018-09-20 DIAGNOSIS — G8918 Other acute postprocedural pain: Secondary | ICD-10-CM | POA: Diagnosis not present

## 2018-09-28 DIAGNOSIS — R262 Difficulty in walking, not elsewhere classified: Secondary | ICD-10-CM | POA: Diagnosis not present

## 2018-09-28 DIAGNOSIS — Z9889 Other specified postprocedural states: Secondary | ICD-10-CM | POA: Diagnosis not present

## 2018-09-28 DIAGNOSIS — S83231A Complex tear of medial meniscus, current injury, right knee, initial encounter: Secondary | ICD-10-CM | POA: Diagnosis not present

## 2018-09-28 DIAGNOSIS — M25661 Stiffness of right knee, not elsewhere classified: Secondary | ICD-10-CM | POA: Diagnosis not present

## 2018-10-02 ENCOUNTER — Ambulatory Visit
Admission: RE | Admit: 2018-10-02 | Discharge: 2018-10-02 | Disposition: A | Discharge status: Home / Self Care | Payer: Medicare Other | Source: Ambulatory Visit | Attending: Surgery | Admitting: Surgery

## 2018-10-02 DIAGNOSIS — Z853 Personal history of malignant neoplasm of breast: Secondary | ICD-10-CM

## 2018-10-02 DIAGNOSIS — R921 Mammographic calcification found on diagnostic imaging of breast: Secondary | ICD-10-CM | POA: Diagnosis not present

## 2018-10-03 DIAGNOSIS — L570 Actinic keratosis: Secondary | ICD-10-CM | POA: Diagnosis not present

## 2019-01-31 ENCOUNTER — Telehealth: Payer: Self-pay | Admitting: Hematology and Oncology

## 2019-01-31 NOTE — Assessment & Plan Note (Signed)
Left lumpectomy 01/22/2016: IDC grade 1, 2.1 cm, with intermediate grade DCIS, LVI present, PNI present, 0/2 lymph nodes negative, ER 95%, PR 90%, HER-2 negative ratio 1.28, Ki-67 15%, T2 N0 stage II a Oncotype DX score 23, 15% risk of recurrence  Adjuvant radiation therapy from 03/11/2016 to 04/07/2016  Treatment plan: Anastrozole 1 mg by mouth dailystarted 04/2016  Anastrozole Toxicities:  muscle stiffness and cramping in the fingers along with trigger finger: I discussed with her that she could stop anastrozole for 2 weeks and see if her symptoms get better.  If the symptoms do get better than we can switch her to letrozole.  If they do not get better than she will go ahead with starting back on anastrozole therapy.    Surveillance:  1. Breast exam 02/03/2018: No evidence of malignancy. 2. Mammogram  10/02/2018: Benign breast density category C  Prior history of ovarian malignancy: Patient follows with her gynecologist for the surveillance with CA-125's.  Return to clinic in1 yearfor follow-up

## 2019-01-31 NOTE — Telephone Encounter (Signed)
I talk with patient regarding visit  °

## 2019-02-05 DIAGNOSIS — R300 Dysuria: Secondary | ICD-10-CM | POA: Diagnosis not present

## 2019-02-05 DIAGNOSIS — N390 Urinary tract infection, site not specified: Secondary | ICD-10-CM | POA: Diagnosis not present

## 2019-02-05 NOTE — Progress Notes (Signed)
HEMATOLOGY-ONCOLOGY TELEPHONE VISIT PROGRESS NOTE  I connected with [name] on 02/06/2019 at 10:00 AM EDT by telephone and verified that I am speaking with the correct person using two identifiers.  I discussed the limitations, risks, security and privacy concerns of performing an evaluation and management service by telephone and the availability of in person appointments.  I also discussed with the patient that there may be a patient responsible charge related to this service. The patient expressed understanding and agreed to proceed.   History of Present Illness: Katrina Young is a 78 y.o. female with above-mentioned history of left breast cancer treated with lumpectomy, radiation, and who is currently on anastrozole therapy. I last saw her a year ago. Mammogram on 10/02/18 showed no evidence of malignancy bilaterally. She is over the phone today for annual follow-up.   Oncology History  Breast cancer of lower-outer quadrant of left female breast ([place])  12/11/2015 Initial Diagnosis   Left breast mass at 3:00: 1.7 x 1.6 x 1.2 cm, axilla negative, grade 2 IDC with DCIS, ER/PR positive HER-2 negative Ki-67 15%, T1 cN0 stage IA clinical stage   01/02/2016 Procedure   Genetic testing: VUS on POLD1 gene called c.3989G>A. Genes analyzed: APC, ATM, AXIN2, BAP1, BARD1, BMPR1A, BRCA1, BRCA2, BRIP1, CDH1, CDK4, CDKN2A, CHEK2, EPCAM, FANCC, MITF, MLH1, MSH2, MSH6, MUTYH, NBN, PALB2, PMS2, POLD1, POLE, PTEN, RAD51C, RAD51D, SCG5/GREM1, SMAD4, STK11, TP53, VHL, and XRCC2   01/22/2016 Surgery   Left lumpectomy ([name]): IDC grade 1, 2.1 cm, with intermediate grade DCIS, LVI present, PNI present, 0/2 lymph nodes negative, ER 95%, PR 90%, HER-2 negative ratio 1.28, Ki-67 15%, T2 N0 stage II a, Oncotype 23, 15% ROR   01/22/2016 Oncotype testing   Recurrence score: 23; ROR 15% (intermediate risk)    03/11/2016 - 04/07/2016 Radiation Therapy   Adjuvant radiation therapy [name]). Left breast: 42.5 Gy in 17  treatments. Left breast "boost": 7.5 Gy in 3 treatments.    04/2016 -  Anti-estrogen oral therapy   Anastrozole 1 mg daily. Planned duration of therapy: 5 years.      Observations/Objective:  No clinical evidence of disease recurrence   Assessment Plan:  Breast cancer of lower-outer quadrant of left female breast ([place]) Left lumpectomy 01/22/2016: IDC grade 1, 2.1 cm, with intermediate grade DCIS, LVI present, PNI present, 0/2 lymph nodes negative, ER 95%, PR 90%, HER-2 negative ratio 1.28, Ki-67 15%, T2 N0 stage II a Oncotype DX score 23, 15% risk of recurrence  Adjuvant radiation therapy from 03/11/2016 to 04/07/2016  Treatment plan: Anastrozole 1 mg by mouth dailystarted 04/2016  Anastrozole Toxicities: Muscle stiffness and cramping in the fingers along with trigger finger Pain in the fingers  Surveillance:  1. Breast exam 02/03/2018: No evidence of malignancy. 2. Mammogram  10/02/2018: Benign breast density category C  Prior history of ovarian malignancy: Patient follows with her gynecologist for the surveillance with CA-125's.  Return to clinic in1 yearfor follow-up  I discussed the assessment and treatment plan with the patient. The patient was provided an opportunity to ask questions and all were answered. The patient agreed with the plan and demonstrated an understanding of the instructions. The patient was advised to call back or seek an in-person evaluation if the symptoms worsen or if the condition fails to improve as anticipated.   I provided 15 minutes of non-face-to-face time during this encounter.   Rulon Eisenmenger, MD 02/06/2019    Julious Oka Dorshimer, am acting as scribe for Nicholas Lose, MD.  I have reviewed the above documentation for accuracy and completeness, and I agree with the above.

## 2019-02-06 ENCOUNTER — Inpatient Hospital Stay: Payer: Medicare Other | Attending: Hematology and Oncology | Admitting: Hematology and Oncology

## 2019-02-06 DIAGNOSIS — Z79811 Long term (current) use of aromatase inhibitors: Secondary | ICD-10-CM | POA: Diagnosis not present

## 2019-02-06 DIAGNOSIS — C50512 Malignant neoplasm of lower-outer quadrant of left female breast: Secondary | ICD-10-CM

## 2019-02-06 DIAGNOSIS — Z170 Estrogen receptor positive status [ER+]: Secondary | ICD-10-CM | POA: Diagnosis not present

## 2019-02-06 DIAGNOSIS — Z923 Personal history of irradiation: Secondary | ICD-10-CM

## 2019-02-06 MED ORDER — ANASTROZOLE 1 MG PO TABS: 1.0000 mg | ORAL_TABLET | Freq: Every day | ORAL | 3 refills | Status: DC

## 2019-02-08 ENCOUNTER — Telehealth: Payer: Self-pay | Admitting: Hematology and Oncology

## 2019-02-08 NOTE — Telephone Encounter (Signed)
I could not reach I will mail

## 2019-02-13 ENCOUNTER — Other Ambulatory Visit: Payer: Self-pay | Admitting: Hematology and Oncology

## 2019-02-14 DIAGNOSIS — R300 Dysuria: Secondary | ICD-10-CM | POA: Diagnosis not present

## 2019-03-13 DIAGNOSIS — R8290 Unspecified abnormal findings in urine: Secondary | ICD-10-CM | POA: Diagnosis not present

## 2019-03-13 DIAGNOSIS — N390 Urinary tract infection, site not specified: Secondary | ICD-10-CM | POA: Diagnosis not present

## 2019-04-05 DIAGNOSIS — Z23 Encounter for immunization: Secondary | ICD-10-CM | POA: Diagnosis not present

## 2019-04-10 DIAGNOSIS — H11041 Peripheral pterygium, stationary, right eye: Secondary | ICD-10-CM | POA: Diagnosis not present

## 2019-04-10 DIAGNOSIS — H5203 Hypermetropia, bilateral: Secondary | ICD-10-CM | POA: Diagnosis not present

## 2019-04-10 DIAGNOSIS — H2513 Age-related nuclear cataract, bilateral: Secondary | ICD-10-CM | POA: Diagnosis not present

## 2019-04-10 DIAGNOSIS — H43811 Vitreous degeneration, right eye: Secondary | ICD-10-CM | POA: Diagnosis not present

## 2019-04-12 DIAGNOSIS — L570 Actinic keratosis: Secondary | ICD-10-CM | POA: Diagnosis not present

## 2019-04-12 DIAGNOSIS — L578 Other skin changes due to chronic exposure to nonionizing radiation: Secondary | ICD-10-CM | POA: Diagnosis not present

## 2019-05-01 DIAGNOSIS — E7849 Other hyperlipidemia: Secondary | ICD-10-CM | POA: Diagnosis not present

## 2019-05-01 DIAGNOSIS — M859 Disorder of bone density and structure, unspecified: Secondary | ICD-10-CM | POA: Diagnosis not present

## 2019-05-03 DIAGNOSIS — R82998 Other abnormal findings in urine: Secondary | ICD-10-CM | POA: Diagnosis not present

## 2019-05-03 DIAGNOSIS — I10 Essential (primary) hypertension: Secondary | ICD-10-CM | POA: Diagnosis not present

## 2019-05-08 DIAGNOSIS — E785 Hyperlipidemia, unspecified: Secondary | ICD-10-CM | POA: Diagnosis not present

## 2019-05-08 DIAGNOSIS — R600 Localized edema: Secondary | ICD-10-CM | POA: Diagnosis not present

## 2019-05-08 DIAGNOSIS — M25561 Pain in right knee: Secondary | ICD-10-CM | POA: Diagnosis not present

## 2019-05-08 DIAGNOSIS — M8580 Other specified disorders of bone density and structure, unspecified site: Secondary | ICD-10-CM | POA: Diagnosis not present

## 2019-05-08 DIAGNOSIS — Z1331 Encounter for screening for depression: Secondary | ICD-10-CM | POA: Diagnosis not present

## 2019-05-08 DIAGNOSIS — I10 Essential (primary) hypertension: Secondary | ICD-10-CM | POA: Diagnosis not present

## 2019-05-08 DIAGNOSIS — G4700 Insomnia, unspecified: Secondary | ICD-10-CM | POA: Diagnosis not present

## 2019-05-08 DIAGNOSIS — C50912 Malignant neoplasm of unspecified site of left female breast: Secondary | ICD-10-CM | POA: Diagnosis not present

## 2019-05-08 DIAGNOSIS — Z0000 Encounter for general adult medical examination without abnormal findings: Secondary | ICD-10-CM | POA: Diagnosis not present

## 2019-05-08 DIAGNOSIS — K5900 Constipation, unspecified: Secondary | ICD-10-CM | POA: Diagnosis not present

## 2019-05-08 DIAGNOSIS — Z1339 Encounter for screening examination for other mental health and behavioral disorders: Secondary | ICD-10-CM | POA: Diagnosis not present

## 2019-05-11 DIAGNOSIS — Z1212 Encounter for screening for malignant neoplasm of rectum: Secondary | ICD-10-CM | POA: Diagnosis not present

## 2019-08-09 DIAGNOSIS — E785 Hyperlipidemia, unspecified: Secondary | ICD-10-CM | POA: Diagnosis not present

## 2019-08-09 DIAGNOSIS — I10 Essential (primary) hypertension: Secondary | ICD-10-CM | POA: Diagnosis not present

## 2019-08-12 ENCOUNTER — Ambulatory Visit: Payer: Medicare Other

## 2019-08-28 ENCOUNTER — Other Ambulatory Visit: Payer: Self-pay | Admitting: Hematology and Oncology

## 2019-08-28 DIAGNOSIS — Z9889 Other specified postprocedural states: Secondary | ICD-10-CM

## 2019-09-18 DIAGNOSIS — Z124 Encounter for screening for malignant neoplasm of cervix: Secondary | ICD-10-CM | POA: Diagnosis not present

## 2019-09-18 DIAGNOSIS — Z6829 Body mass index (BMI) 29.0-29.9, adult: Secondary | ICD-10-CM | POA: Diagnosis not present

## 2019-09-21 DIAGNOSIS — Z86010 Personal history of colonic polyps: Secondary | ICD-10-CM | POA: Diagnosis not present

## 2019-09-21 DIAGNOSIS — K219 Gastro-esophageal reflux disease without esophagitis: Secondary | ICD-10-CM | POA: Diagnosis not present

## 2019-09-21 DIAGNOSIS — K639 Disease of intestine, unspecified: Secondary | ICD-10-CM | POA: Diagnosis not present

## 2019-10-01 DIAGNOSIS — Z853 Personal history of malignant neoplasm of breast: Secondary | ICD-10-CM | POA: Diagnosis not present

## 2019-10-01 DIAGNOSIS — C50912 Malignant neoplasm of unspecified site of left female breast: Secondary | ICD-10-CM | POA: Diagnosis not present

## 2019-10-09 DIAGNOSIS — L570 Actinic keratosis: Secondary | ICD-10-CM | POA: Diagnosis not present

## 2019-10-09 DIAGNOSIS — L728 Other follicular cysts of the skin and subcutaneous tissue: Secondary | ICD-10-CM | POA: Diagnosis not present

## 2019-10-16 ENCOUNTER — Ambulatory Visit
Admission: RE | Admit: 2019-10-16 | Discharge: 2019-10-16 | Disposition: A | Discharge status: Home / Self Care | Payer: Medicare Other | Source: Ambulatory Visit | Attending: Hematology and Oncology | Admitting: Hematology and Oncology

## 2019-10-16 ENCOUNTER — Other Ambulatory Visit: Payer: Self-pay

## 2019-10-16 DIAGNOSIS — Z9889 Other specified postprocedural states: Secondary | ICD-10-CM

## 2019-10-16 DIAGNOSIS — R922 Inconclusive mammogram: Secondary | ICD-10-CM | POA: Diagnosis not present

## 2019-10-16 HISTORY — DX: Personal history of irradiation: Z92.3

## 2019-10-30 DIAGNOSIS — Z1159 Encounter for screening for other viral diseases: Secondary | ICD-10-CM | POA: Diagnosis not present

## 2019-11-02 DIAGNOSIS — K621 Rectal polyp: Secondary | ICD-10-CM | POA: Diagnosis not present

## 2019-11-02 DIAGNOSIS — K635 Polyp of colon: Secondary | ICD-10-CM | POA: Diagnosis not present

## 2019-11-02 DIAGNOSIS — D122 Benign neoplasm of ascending colon: Secondary | ICD-10-CM | POA: Diagnosis not present

## 2019-11-02 DIAGNOSIS — D123 Benign neoplasm of transverse colon: Secondary | ICD-10-CM | POA: Diagnosis not present

## 2019-11-02 DIAGNOSIS — D120 Benign neoplasm of cecum: Secondary | ICD-10-CM | POA: Diagnosis not present

## 2019-11-02 DIAGNOSIS — Z86010 Personal history of colonic polyps: Secondary | ICD-10-CM | POA: Diagnosis not present

## 2019-11-02 DIAGNOSIS — Z800 Family history of malignant neoplasm of digestive organs: Secondary | ICD-10-CM | POA: Diagnosis not present

## 2019-11-06 DIAGNOSIS — K635 Polyp of colon: Secondary | ICD-10-CM | POA: Diagnosis not present

## 2019-11-06 DIAGNOSIS — K621 Rectal polyp: Secondary | ICD-10-CM | POA: Diagnosis not present

## 2019-11-06 DIAGNOSIS — D120 Benign neoplasm of cecum: Secondary | ICD-10-CM | POA: Diagnosis not present

## 2019-11-06 DIAGNOSIS — D122 Benign neoplasm of ascending colon: Secondary | ICD-10-CM | POA: Diagnosis not present

## 2019-11-06 DIAGNOSIS — D123 Benign neoplasm of transverse colon: Secondary | ICD-10-CM | POA: Diagnosis not present

## 2020-01-09 ENCOUNTER — Other Ambulatory Visit: Payer: Self-pay | Admitting: *Deleted

## 2020-01-09 MED ORDER — ANASTROZOLE 1 MG PO TABS: 1.0000 mg | ORAL_TABLET | Freq: Every day | ORAL | 3 refills | Status: DC

## 2020-02-06 NOTE — Progress Notes (Signed)
Patient Care Team: Leanna Battles, [name], [name], [name], MD as Consulting Physician (Radiation Oncology)  DIAGNOSIS:    ICD-10-CM   1. Malignant neoplasm of lower-outer quadrant of left breast of female, estrogen receptor positive ([place])  C50.512    Z17.0     SUMMARY OF ONCOLOGIC HISTORY: Oncology History  Breast cancer of lower-outer quadrant of left female breast ([place])  12/11/2015 Initial Diagnosis   Left breast mass at 3:00: 1.7 x 1.6 x 1.2 cm, axilla negative, grade 2 IDC with DCIS, ER/PR positive HER-2 negative Ki-67 15%, T1 cN0 stage IA clinical stage   01/02/2016 Procedure   Genetic testing: VUS on POLD1 gene called c.3989G>A. Genes analyzed: APC, ATM, AXIN2, BAP1, BARD1, BMPR1A, BRCA1, BRCA2, BRIP1, CDH1, CDK4, CDKN2A, CHEK2, EPCAM, FANCC, MITF, MLH1, MSH2, MSH6, MUTYH, NBN, PALB2, PMS2, POLD1, POLE, PTEN, RAD51C, RAD51D, SCG5/GREM1, SMAD4, STK11, TP53, VHL, and XRCC2   01/22/2016 Surgery   Left lumpectomy ([name]): IDC grade 1, 2.1 cm, with intermediate grade DCIS, LVI present, PNI present, 0/2 lymph nodes negative, ER 95%, PR 90%, HER-2 negative ratio 1.28, Ki-67 15%, T2 N0 stage II a, Oncotype 23, 15% ROR   01/22/2016 Oncotype testing   Recurrence score: 23; ROR 15% (intermediate risk)    03/11/2016 - 04/07/2016 Radiation Therapy   Adjuvant radiation therapy [name]). Left breast: 42.5 Gy in 17 treatments. Left breast "boost": 7.5 Gy in 3 treatments.    04/2016 -  Anti-estrogen oral therapy   Anastrozole 1 mg daily. Planned duration of therapy: 5 years.      CHIEF COMPLIANT: Follow-up of left breast cancer on anastrozole  INTERVAL HISTORY: Katrina Young is a 79 y.o. with above-mentioned history of left breast cancer treated with lumpectomy, radiation, and who is currently on anastrozole therapy. Mammogram on 10/16/19 showed  no evidence of malignancy bilaterally. She presents to the clinic today for annual follow-up.    ALLERGIES:  is allergic to codeine and morphine and related.  MEDICATIONS:  Current Outpatient Medications  Medication Sig Dispense Refill  . anastrozole (ARIMIDEX) 1 MG tablet Take 1 tablet (1 mg total) by mouth daily. 90 tablet 3  . Ascorbic Acid (VITAMIN C) 100 MG tablet Take 100 mg by mouth daily.    Marland Kitchen aspirin EC 81 MG tablet Take 81 mg by mouth daily.    Marland Kitchen atenolol (TENORMIN) 100 MG tablet Take 1 tablet (100 mg total) by mouth every morning.    . Calcium-Vitamin D-Vitamin K (CALCIUM SOFT CHEWS PO) Take by mouth.    . cholecalciferol (VITAMIN D) 1000 UNITS tablet Take 1,000 Units by mouth daily.    Marland Kitchen esomeprazole (NEXIUM) 40 MG capsule Take 40 mg by mouth daily before breakfast.     . ibuprofen (ADVIL,MOTRIN) 200 MG tablet Take 200 mg by mouth every 6 (six) hours as needed.    Marland Kitchen LORazepam (ATIVAN) 1 MG tablet Take 1 mg by mouth 2 (two) times daily as needed for anxiety.     . Misc Natural Products (OSTEO BI-FLEX/5-LOXIN ADVANCED PO) Take by mouth.    . Multiple Vitamin (MULTIVITAMIN WITH MINERALS) TABS tablet Take 1 tablet by mouth daily.    . Omega 3 1000 MG CAPS Take 1 capsule by mouth daily.    . Phenazopyridine HCl (AZO TABS PO) Take 2 tablets by mouth daily. Pt takes as needed when she has a flare-up    .  pravastatin (PRAVACHOL) 20 MG tablet Take 20 mg by mouth daily.     . vitamin B-12 (CYANOCOBALAMIN) 1000 MCG tablet Take 1,000 mcg by mouth daily.     No current facility-administered medications for this visit.    PHYSICAL EXAMINATION: ECOG PERFORMANCE STATUS: 1 - Symptomatic but completely ambulatory  There were no vitals filed for this visit. There were no vitals filed for this visit.  BREAST: No palpable masses or nodules in either right or left breasts. No palpable axillary supraclavicular or infraclavicular adenopathy no breast tenderness or nipple discharge. (exam  performed in the presence of a chaperone)  LABORATORY DATA:  I have reviewed the data as listed CMP Latest Ref Rng & Units 01/19/2016 12/17/2015 07/15/2014  Glucose 65 - 99 mg/dL 109(H) 109 102(H)  BUN 6 - 20 mg/dL 13 17.0 14  Creatinine 0.44 - 1.00 mg/dL 0.64 0.8 0.72  Sodium 135 - 145 mmol/L 141 144 147  Potassium 3.5 - 5.1 mmol/L 4.5 4.4 5.1  Chloride 101 - 111 mmol/L 105 - 106  CO2 22 - 32 mmol/L 29 25 26   Calcium 8.9 - 10.3 mg/dL 9.9 9.8 10.3  Total Protein 6.5 - 8.1 g/dL 6.5 7.4 -  Total Bilirubin 0.3 - 1.2 mg/dL 0.6 0.59 -  Alkaline Phos 38 - 126 U/L 93 104 -  AST 15 - 41 U/L 28 24 -  ALT 14 - 54 U/L 25 23 -    Lab Results  Component Value Date   WBC 4.7 01/19/2016   HGB 12.7 01/19/2016   HCT 38.4 01/19/2016   MCV 86.3 01/19/2016   PLT 191 01/19/2016   NEUTROABS 2.8 01/19/2016    ASSESSMENT & PLAN:  Breast cancer of lower-outer quadrant of left female breast ([place]) Left lumpectomy 01/22/2016: IDC grade 1, 2.1 cm, with intermediate grade DCIS, LVI present, PNI present, 0/2 lymph nodes negative, ER 95%, PR 90%, HER-2 negative ratio 1.28, Ki-67 15%, T2 N0 stage II a Oncotype DX score 23, 15% risk of recurrence  Adjuvant radiation therapy from 03/11/2016 to 04/07/2016  Treatment plan: Anastrozole 1 mg by mouth dailystarted 04/2016  Anastrozole Toxicities: Muscle stiffness and cramping in the fingers along with trigger finger Pain in the fingers  I discussed with her about extending antiestrogen therapy for total of 7 years and she is agreeable to it.  She tells me that she would rather take it her whole life if it is going to help.  Surveillance:  1. Breast exam7/15/2021: Noevidence of malignancy. 2. Mammogram3/23/2021: Benign breast density category C  Prior history of ovarian malignancy: Patient follows with her gynecologist for the surveillance with CA-125's.  Return to clinic in1 yearfor follow-up    No orders of the defined types were placed in  this encounter.  The patient has a good understanding of the overall plan. she agrees with it. she will call with any problems that may develop before the next visit here.  Total time spent: 20 mins including face to face time and time spent for planning, charting and coordination of care  Nicholas Lose, MD 02/07/2020  I, Cloyde Reams Dorshimer, am acting as scribe for Dr. Nicholas Lose.  I have reviewed the above documentation for accuracy and completeness, and I agree with the above.

## 2020-02-07 ENCOUNTER — Inpatient Hospital Stay: Payer: Medicare Other | Attending: Hematology and Oncology | Admitting: Hematology and Oncology

## 2020-02-07 ENCOUNTER — Other Ambulatory Visit: Payer: Self-pay

## 2020-02-07 DIAGNOSIS — Z79811 Long term (current) use of aromatase inhibitors: Secondary | ICD-10-CM | POA: Insufficient documentation

## 2020-02-07 DIAGNOSIS — Z170 Estrogen receptor positive status [ER+]: Secondary | ICD-10-CM | POA: Insufficient documentation

## 2020-02-07 DIAGNOSIS — Z923 Personal history of irradiation: Secondary | ICD-10-CM | POA: Diagnosis not present

## 2020-02-07 DIAGNOSIS — Z7982 Long term (current) use of aspirin: Secondary | ICD-10-CM | POA: Diagnosis not present

## 2020-02-07 DIAGNOSIS — Z79899 Other long term (current) drug therapy: Secondary | ICD-10-CM | POA: Diagnosis not present

## 2020-02-07 DIAGNOSIS — C50512 Malignant neoplasm of lower-outer quadrant of left female breast: Secondary | ICD-10-CM | POA: Insufficient documentation

## 2020-02-07 MED ORDER — ANASTROZOLE 1 MG PO TABS: 1.0000 mg | ORAL_TABLET | Freq: Every day | ORAL | 3 refills | Status: DC

## 2020-02-07 NOTE — Assessment & Plan Note (Signed)
Left lumpectomy 01/22/2016: IDC grade 1, 2.1 cm, with intermediate grade DCIS, LVI present, PNI present, 0/2 lymph nodes negative, ER 95%, PR 90%, HER-2 negative ratio 1.28, Ki-67 15%, T2 N0 stage II a Oncotype DX score 23, 15% risk of recurrence  Adjuvant radiation therapy from 03/11/2016 to 04/07/2016  Treatment plan: Anastrozole 1 mg by mouth dailystarted 04/2016  Anastrozole Toxicities: Muscle stiffness and cramping in the fingers along with trigger finger Pain in the fingers  Surveillance:  1. Breast exam7/15/2021: Noevidence of malignancy. 2. Mammogram3/23/2021: Benign breast density category C  Prior history of ovarian malignancy: Patient follows with her gynecologist for the surveillance with CA-125's.  Return to clinic in1 yearfor follow-up

## 2020-04-18 DIAGNOSIS — H11041 Peripheral pterygium, stationary, right eye: Secondary | ICD-10-CM | POA: Diagnosis not present

## 2020-04-18 DIAGNOSIS — H5203 Hypermetropia, bilateral: Secondary | ICD-10-CM | POA: Diagnosis not present

## 2020-04-18 DIAGNOSIS — H2513 Age-related nuclear cataract, bilateral: Secondary | ICD-10-CM | POA: Diagnosis not present

## 2020-04-18 DIAGNOSIS — H43811 Vitreous degeneration, right eye: Secondary | ICD-10-CM | POA: Diagnosis not present

## 2020-04-18 DIAGNOSIS — H524 Presbyopia: Secondary | ICD-10-CM | POA: Diagnosis not present

## 2020-04-22 DIAGNOSIS — D485 Neoplasm of uncertain behavior of skin: Secondary | ICD-10-CM | POA: Diagnosis not present

## 2020-04-22 DIAGNOSIS — L578 Other skin changes due to chronic exposure to nonionizing radiation: Secondary | ICD-10-CM | POA: Diagnosis not present

## 2020-04-22 DIAGNOSIS — B079 Viral wart, unspecified: Secondary | ICD-10-CM | POA: Diagnosis not present

## 2020-04-22 DIAGNOSIS — L570 Actinic keratosis: Secondary | ICD-10-CM | POA: Diagnosis not present

## 2020-04-30 DIAGNOSIS — Z23 Encounter for immunization: Secondary | ICD-10-CM | POA: Diagnosis not present

## 2020-05-06 DIAGNOSIS — E785 Hyperlipidemia, unspecified: Secondary | ICD-10-CM | POA: Diagnosis not present

## 2020-05-13 DIAGNOSIS — Z853 Personal history of malignant neoplasm of breast: Secondary | ICD-10-CM | POA: Diagnosis not present

## 2020-05-13 DIAGNOSIS — K219 Gastro-esophageal reflux disease without esophagitis: Secondary | ICD-10-CM | POA: Diagnosis not present

## 2020-05-13 DIAGNOSIS — Z0000 Encounter for general adult medical examination without abnormal findings: Secondary | ICD-10-CM | POA: Diagnosis not present

## 2020-05-13 DIAGNOSIS — I10 Essential (primary) hypertension: Secondary | ICD-10-CM | POA: Diagnosis not present

## 2020-05-13 DIAGNOSIS — I872 Venous insufficiency (chronic) (peripheral): Secondary | ICD-10-CM | POA: Diagnosis not present

## 2020-05-13 DIAGNOSIS — R195 Other fecal abnormalities: Secondary | ICD-10-CM | POA: Diagnosis not present

## 2020-05-13 DIAGNOSIS — G4700 Insomnia, unspecified: Secondary | ICD-10-CM | POA: Diagnosis not present

## 2020-05-13 DIAGNOSIS — R82998 Other abnormal findings in urine: Secondary | ICD-10-CM | POA: Diagnosis not present

## 2020-05-21 DIAGNOSIS — K130 Diseases of lips: Secondary | ICD-10-CM | POA: Diagnosis not present

## 2020-05-21 DIAGNOSIS — B078 Other viral warts: Secondary | ICD-10-CM | POA: Diagnosis not present

## 2020-05-31 DIAGNOSIS — Z23 Encounter for immunization: Secondary | ICD-10-CM | POA: Diagnosis not present

## 2020-06-18 DIAGNOSIS — Z1212 Encounter for screening for malignant neoplasm of rectum: Secondary | ICD-10-CM | POA: Diagnosis not present

## 2020-06-20 DIAGNOSIS — R509 Fever, unspecified: Secondary | ICD-10-CM | POA: Diagnosis not present

## 2020-06-20 DIAGNOSIS — J189 Pneumonia, unspecified organism: Secondary | ICD-10-CM | POA: Diagnosis not present

## 2020-06-20 DIAGNOSIS — R059 Cough, unspecified: Secondary | ICD-10-CM | POA: Diagnosis not present

## 2020-06-20 DIAGNOSIS — Z1152 Encounter for screening for COVID-19: Secondary | ICD-10-CM | POA: Diagnosis not present

## 2020-09-09 ENCOUNTER — Other Ambulatory Visit: Payer: Self-pay | Admitting: Hematology and Oncology

## 2020-09-09 DIAGNOSIS — Z9889 Other specified postprocedural states: Secondary | ICD-10-CM

## 2020-09-20 ENCOUNTER — Other Ambulatory Visit: Payer: Self-pay

## 2020-09-20 ENCOUNTER — Inpatient Hospital Stay (HOSPITAL_COMMUNITY)
Admission: EM | Admit: 2020-09-20 | Discharge: 2020-09-22 | DRG: 287 | Disposition: A | Discharge status: Home / Self Care | Payer: Medicare Other | Attending: Internal Medicine | Admitting: Internal Medicine

## 2020-09-20 ENCOUNTER — Emergency Department (HOSPITAL_COMMUNITY): Payer: Medicare Other

## 2020-09-20 DIAGNOSIS — I447 Left bundle-branch block, unspecified: Secondary | ICD-10-CM

## 2020-09-20 DIAGNOSIS — M1990 Unspecified osteoarthritis, unspecified site: Secondary | ICD-10-CM | POA: Diagnosis present

## 2020-09-20 DIAGNOSIS — Z853 Personal history of malignant neoplasm of breast: Secondary | ICD-10-CM

## 2020-09-20 DIAGNOSIS — E785 Hyperlipidemia, unspecified: Secondary | ICD-10-CM | POA: Diagnosis present

## 2020-09-20 DIAGNOSIS — I5189 Other ill-defined heart diseases: Secondary | ICD-10-CM | POA: Diagnosis present

## 2020-09-20 DIAGNOSIS — Z8543 Personal history of malignant neoplasm of ovary: Secondary | ICD-10-CM

## 2020-09-20 DIAGNOSIS — I272 Pulmonary hypertension, unspecified: Secondary | ICD-10-CM | POA: Diagnosis present

## 2020-09-20 DIAGNOSIS — Z79899 Other long term (current) drug therapy: Secondary | ICD-10-CM

## 2020-09-20 DIAGNOSIS — M7989 Other specified soft tissue disorders: Secondary | ICD-10-CM | POA: Diagnosis present

## 2020-09-20 DIAGNOSIS — J811 Chronic pulmonary edema: Secondary | ICD-10-CM | POA: Diagnosis not present

## 2020-09-20 DIAGNOSIS — Z806 Family history of leukemia: Secondary | ICD-10-CM

## 2020-09-20 DIAGNOSIS — F419 Anxiety disorder, unspecified: Secondary | ICD-10-CM | POA: Diagnosis present

## 2020-09-20 DIAGNOSIS — I214 Non-ST elevation (NSTEMI) myocardial infarction: Secondary | ICD-10-CM

## 2020-09-20 DIAGNOSIS — I200 Unstable angina: Secondary | ICD-10-CM | POA: Diagnosis not present

## 2020-09-20 DIAGNOSIS — Z79811 Long term (current) use of aromatase inhibitors: Secondary | ICD-10-CM

## 2020-09-20 DIAGNOSIS — R0602 Shortness of breath: Secondary | ICD-10-CM | POA: Diagnosis not present

## 2020-09-20 DIAGNOSIS — H269 Unspecified cataract: Secondary | ICD-10-CM | POA: Diagnosis present

## 2020-09-20 DIAGNOSIS — I10 Essential (primary) hypertension: Secondary | ICD-10-CM | POA: Diagnosis not present

## 2020-09-20 DIAGNOSIS — Z8249 Family history of ischemic heart disease and other diseases of the circulatory system: Secondary | ICD-10-CM

## 2020-09-20 DIAGNOSIS — Z833 Family history of diabetes mellitus: Secondary | ICD-10-CM

## 2020-09-20 DIAGNOSIS — Z803 Family history of malignant neoplasm of breast: Secondary | ICD-10-CM

## 2020-09-20 DIAGNOSIS — R0902 Hypoxemia: Secondary | ICD-10-CM | POA: Diagnosis not present

## 2020-09-20 DIAGNOSIS — Z20822 Contact with and (suspected) exposure to covid-19: Secondary | ICD-10-CM | POA: Diagnosis present

## 2020-09-20 DIAGNOSIS — Z923 Personal history of irradiation: Secondary | ICD-10-CM

## 2020-09-20 DIAGNOSIS — Z885 Allergy status to narcotic agent status: Secondary | ICD-10-CM

## 2020-09-20 DIAGNOSIS — Z7982 Long term (current) use of aspirin: Secondary | ICD-10-CM

## 2020-09-20 DIAGNOSIS — R079 Chest pain, unspecified: Secondary | ICD-10-CM

## 2020-09-20 DIAGNOSIS — I517 Cardiomegaly: Secondary | ICD-10-CM | POA: Diagnosis not present

## 2020-09-20 DIAGNOSIS — Z800 Family history of malignant neoplasm of digestive organs: Secondary | ICD-10-CM

## 2020-09-20 DIAGNOSIS — K5900 Constipation, unspecified: Secondary | ICD-10-CM | POA: Diagnosis present

## 2020-09-20 DIAGNOSIS — J9811 Atelectasis: Secondary | ICD-10-CM | POA: Diagnosis not present

## 2020-09-20 DIAGNOSIS — Z90710 Acquired absence of both cervix and uterus: Secondary | ICD-10-CM

## 2020-09-20 DIAGNOSIS — Z823 Family history of stroke: Secondary | ICD-10-CM

## 2020-09-20 DIAGNOSIS — Z6829 Body mass index (BMI) 29.0-29.9, adult: Secondary | ICD-10-CM

## 2020-09-20 DIAGNOSIS — E669 Obesity, unspecified: Secondary | ICD-10-CM | POA: Diagnosis present

## 2020-09-20 DIAGNOSIS — K219 Gastro-esophageal reflux disease without esophagitis: Secondary | ICD-10-CM | POA: Diagnosis present

## 2020-09-20 DIAGNOSIS — Z87891 Personal history of nicotine dependence: Secondary | ICD-10-CM

## 2020-09-20 DIAGNOSIS — R0789 Other chest pain: Secondary | ICD-10-CM | POA: Diagnosis not present

## 2020-09-20 DIAGNOSIS — Z808 Family history of malignant neoplasm of other organs or systems: Secondary | ICD-10-CM

## 2020-09-20 DIAGNOSIS — Z8042 Family history of malignant neoplasm of prostate: Secondary | ICD-10-CM

## 2020-09-20 LAB — CBC
HCT: 40.4 % (ref 36.0–46.0)
HCT: 39 % (ref 36.0–46.0)
Hemoglobin: 13.6 g/dL (ref 12.0–15.0)
Hemoglobin: 13.6 g/dL (ref 12.0–15.0)
MCH: 29.4 pg (ref 26.0–34.0)
MCH: 29.9 pg (ref 26.0–34.0)
MCHC: 33.7 g/dL (ref 30.0–36.0)
MCHC: 34.9 g/dL (ref 30.0–36.0)
MCV: 87.4 fL (ref 80.0–100.0)
MCV: 85.7 fL (ref 80.0–100.0)
Platelets: 203 10*3/uL (ref 150–400)
Platelets: 160 10*3/uL (ref 150–400)
RBC: 4.62 MIL/uL (ref 3.87–5.11)
RBC: 4.55 MIL/uL (ref 3.87–5.11)
RDW: 12.2 % (ref 11.5–15.5)
RDW: 12.3 % (ref 11.5–15.5)
WBC: 6.6 10*3/uL (ref 4.0–10.5)
WBC: 8 10*3/uL (ref 4.0–10.5)
nRBC: 0 % (ref 0.0–0.2)
nRBC: 0 % (ref 0.0–0.2)

## 2020-09-20 LAB — TROPONIN I (HIGH SENSITIVITY)
Troponin I (High Sensitivity): 13 ng/L (ref <18)
Troponin I (High Sensitivity): 27 ng/L — ABNORMAL HIGH (ref <18)

## 2020-09-20 LAB — COMPREHENSIVE METABOLIC PANEL
ALT: 28 U/L (ref 0–44)
AST: 28 U/L (ref 15–41)
Albumin: 3.8 g/dL (ref 3.5–5.0)
Alkaline Phosphatase: 116 U/L (ref 38–126)
Anion gap: 12 (ref 5–15)
BUN: 17 mg/dL (ref 8–23)
CO2: 23 mmol/L (ref 22–32)
Calcium: 9.3 mg/dL (ref 8.9–10.3)
Chloride: 105 mmol/L (ref 98–111)
Creatinine, Ser: 0.89 mg/dL (ref 0.44–1.00)
GFR, Estimated: >60 mL/min (ref >60)
Glucose, Bld: 132 mg/dL — ABNORMAL HIGH (ref 70–99)
Potassium: 4 mmol/L (ref 3.5–5.1)
Sodium: 140 mmol/L (ref 135–145)
Total Bilirubin: 0.7 mg/dL (ref 0.3–1.2)
Total Protein: 6.6 g/dL (ref 6.5–8.1)

## 2020-09-20 LAB — CBG MONITORING, ED: Glucose-Capillary: 108 mg/dL — ABNORMAL HIGH (ref 70–99)

## 2020-09-20 LAB — BRAIN NATRIURETIC PEPTIDE: B Natriuretic Peptide: 93.4 pg/mL (ref 0.0–100.0)

## 2020-09-20 MED ORDER — NITROGLYCERIN IN D5W 200-5 MCG/ML-% IV SOLN
5.0000 ug/min | INTRAVENOUS | Status: DC
  Administered 2020-09-20: 5 ug/min via INTRAVENOUS
  Filled 2020-09-20: qty 250

## 2020-09-20 MED ORDER — HEPARIN (PORCINE) 25000 UT/250ML-% IV SOLN
1250.0000 [IU]/h | INTRAVENOUS | Status: DC
  Administered 2020-09-21: 1050 [IU]/h via INTRAVENOUS
  Administered 2020-09-21: 850 [IU]/h via INTRAVENOUS
  Filled 2020-09-20 (×3): qty 250

## 2020-09-20 MED ORDER — ADULT MULTIVITAMIN W/MINERALS CH
1.0000 | ORAL_TABLET | Freq: Every day | ORAL | Status: DC
  Administered 2020-09-20 – 2020-09-22 (×3): 1 via ORAL
  Filled 2020-09-20 (×3): qty 1

## 2020-09-20 MED ORDER — VITAMIN D 25 MCG (1000 UNIT) PO TABS
1000.0000 [IU] | ORAL_TABLET | Freq: Every day | ORAL | Status: DC
  Administered 2020-09-20 – 2020-09-22 (×3): 1000 [IU] via ORAL
  Filled 2020-09-20 (×3): qty 1

## 2020-09-20 MED ORDER — ATENOLOL 50 MG PO TABS: 100.0000 mg | ORAL_TABLET | Freq: Every morning | ORAL | Status: DC

## 2020-09-20 MED ORDER — IOHEXOL 350 MG/ML SOLN
100.0000 mL | Freq: Once | INTRAVENOUS | Status: AC | PRN
  Administered 2020-09-20: 100 mL via INTRAVENOUS

## 2020-09-20 MED ORDER — METOPROLOL TARTRATE 25 MG PO TABS
25.0000 mg | ORAL_TABLET | Freq: Two times a day (BID) | ORAL | Status: DC
  Administered 2020-09-21 – 2020-09-22 (×4): 25 mg via ORAL
  Filled 2020-09-20 (×4): qty 1

## 2020-09-20 MED ORDER — PRAVASTATIN SODIUM 10 MG PO TABS
20.0000 mg | ORAL_TABLET | Freq: Every day | ORAL | Status: DC
  Administered 2020-09-20: 20 mg via ORAL
  Filled 2020-09-20: qty 2

## 2020-09-20 MED ORDER — SODIUM CHLORIDE 0.9 % IV SOLN: INTRAVENOUS | Status: DC

## 2020-09-20 MED ORDER — ASPIRIN EC 81 MG PO TBEC
81.0000 mg | DELAYED_RELEASE_TABLET | Freq: Every day | ORAL | Status: DC
  Administered 2020-09-20 – 2020-09-21 (×2): 81 mg via ORAL
  Filled 2020-09-20 (×2): qty 1

## 2020-09-20 MED ORDER — HEPARIN BOLUS VIA INFUSION
4000.0000 [IU] | Freq: Once | INTRAVENOUS | Status: AC
  Administered 2020-09-21: 4000 [IU] via INTRAVENOUS
  Filled 2020-09-20: qty 4000

## 2020-09-20 MED ORDER — PANTOPRAZOLE SODIUM 40 MG PO TBEC
40.0000 mg | DELAYED_RELEASE_TABLET | Freq: Every day | ORAL | Status: DC
  Administered 2020-09-20 – 2020-09-22 (×3): 40 mg via ORAL
  Filled 2020-09-20 (×3): qty 1

## 2020-09-20 MED ORDER — LORAZEPAM 1 MG PO TABS
1.0000 mg | ORAL_TABLET | Freq: Every day | ORAL | Status: DC
  Administered 2020-09-21 – 2020-09-22 (×2): 1 mg via ORAL
  Filled 2020-09-20 (×3): qty 1

## 2020-09-20 MED ORDER — ANASTROZOLE 1 MG PO TABS
1.0000 mg | ORAL_TABLET | Freq: Every day | ORAL | Status: DC
  Administered 2020-09-21 – 2020-09-22 (×2): 1 mg via ORAL
  Filled 2020-09-20 (×2): qty 1

## 2020-09-20 MED ORDER — ATORVASTATIN CALCIUM 80 MG PO TABS
80.0000 mg | ORAL_TABLET | Freq: Every day | ORAL | Status: DC
  Administered 2020-09-21 – 2020-09-22 (×3): 80 mg via ORAL
  Filled 2020-09-20 (×4): qty 1

## 2020-09-20 NOTE — Progress Notes (Signed)
[place] for Heparin Indication: chest pain/ACS  Allergies  Allergen Reactions  . Codeine Nausea Only  . Morphine And Related Nausea Only    Patient Measurements: Height: 5\' 3"  (160 cm) Weight: 79.9 kg (176 lb 2.4 oz) IBW/kg (Calculated) : 52.4 Heparin Dosing Weight: 69.8 kg  Vital Signs: Temp: 97.5 F (36.4 C) (02/26 1841) Temp Source: Oral (02/26 1841) BP: 182/81 (02/26 1915) Pulse Rate: 65 (02/26 1915)  Labs: Recent Labs    09/20/20 1905 09/20/20 2050  HGB 13.6 13.6  HCT 40.4 39.0  PLT 203 160  CREATININE 0.89  --   TROPONINIHS 13  --     Estimated Creatinine Clearance: 51.3 mL/min (by C-G formula based on SCr of 0.89 mg/dL).   Medical History: Past Medical History:  Diagnosis Date  . Arthritis    legs, back. neck  . Breast cancer ([place])   . Breast cancer of lower-outer quadrant of left female breast ([place]) 12/11/2015  . Cancer ([place])    ovarian cancer (hysterectomy-12-14 years ago) no txs  . Complication of anesthesia   . Constipation   . Diminished eyesight change in eyesight   cataracts  . Family history of breast cancer   . Family history of colon cancer   . Family history of melanoma   . Family history of prostate cancer   . GERD (gastroesophageal reflux disease)   . 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 Hyperlipidemia   . Hypertension   . Joint pain   . Leg pain   . Personal history of radiation therapy   . PONV (postoperative nausea and vomiting)   . Reflux   . Shortness of breath dyspnea    increased walking or climbing stairs  . Varicose veins   . Weight gain     Medications:  Scheduled:  . [START ON 09/21/2020] anastrozole  1 mg Oral Daily  . aspirin EC  81 mg Oral Daily  . [START ON 09/21/2020] atenolol  100 mg Oral q morning  . cholecalciferol  1,000 Units Oral Daily  . heparin  4,000 Units Intravenous Once  . [START ON 09/21/2020] LORazepam  1 mg Oral Daily  . multivitamin with minerals  1  tablet Oral Daily  . pantoprazole  40 mg Oral Daily  . pravastatin  20 mg Oral Daily    Assessment: Patient is a 22 yof that is being admitted for chest pain. The patient was not found to an initial elevated trop however cardiology is concerned for ACS at this time and has asked pharmacy to dose heparin.    Goal of Therapy:  Heparin level 0.3-0.7 units/ml Monitor platelets by anticoagulation protocol: Yes   Plan:  - Heparin bolus 4000 units IV x 1 dose - Heparin drip @ 850 units/hr - Heparin level in ~ 6 hours  - Monitor patient for s/s of bleeding and CBC while on heparin   Duanne Limerick PharmD. BCPS  09/20/2020,9:36 PM

## 2020-09-20 NOTE — ED Provider Notes (Signed)
[facility] Provider Note   CSN: [id number] Arrival date & time: 09/20/20  1835     History No chief complaint on file.   [facility] is a 80 y.o. female with PMH significant for HTN, HLD, and breast cancer currently on anastrozole chemotherapy who presents to the ED via EMS for exertional chest pain.  I personally obtained history from EMS who reports exertional symptoms that they treated with sublingual NTG x2 and 324 mg aspirin.  On my examination, patient reports that for the past week while milking the goats she has been developing central chest "tightness" and shortness of breath.  She states that this is new and unusual for her.  She states that the past few days, it has gone away after going into the house and relaxing.  However, today it continued to persist.  She states that she had to leave the home and sit out on the porch and felt as though she was having difficulty catching her breath.  Her family encouraged her to take one of her prescribed benzodiazepines, but it failed to improve her symptoms.  She does admit that after being given sublingual nitroglycerin, her discomfort and shortness of breath improved considerably.  However, she still feels as though she is not back at her baseline is still endorsing dyspnea and 5 out of 10 central chest "tightness".  She denies any associated nausea, emesis, diaphoresis, or lightheadedness.    Patient reports that she was seen by cardiology, Dr. Martinique, but that it has been quite a long time.  I reviewed patient's medical record and she has not been seen by cardiology in approximately a decade.  HPI     Past Medical History:  Diagnosis Date  . Arthritis    legs, back. neck  . Breast cancer ([facility])   . Breast cancer of lower-outer quadrant of left female breast ([place]) 12/11/2015  . Cancer ([place])    ovarian cancer (hysterectomy-12-14 years ago) no txs  . Complication of anesthesia   . Constipation    . Diminished eyesight change in eyesight   cataracts  . Family history of breast cancer   . Family history of colon cancer   . Family history of melanoma   . Family history of prostate cancer   . GERD (gastroesophageal reflux disease)   . 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 Hyperlipidemia   . Hypertension   . Joint pain   . Leg pain   . Personal history of radiation therapy   . PONV (postoperative nausea and vomiting)   . Reflux   . Shortness of breath dyspnea    increased walking or climbing stairs  . Varicose veins   . Weight gain     Patient Active Problem List   Diagnosis Date Noted  . Chest pain 09/20/2020  . Genetic testing 01/05/2016  . Ovarian cancer ([place]) 12/17/2015  . Family history of prostate cancer   . Family history of breast cancer   . Family history of colon cancer   . Family history of melanoma   . Breast cancer of lower-outer quadrant of left female breast ([place]) 12/11/2015  . Ventral incisional hernia 07/22/2014  . Pain in limb 12/28/2011  . Varicose veins of lower extremities with other complications 22/08/5425  . Leg pain 02/02/2011  . Varicose veins of right lower extremity with pain 02/02/2011    Past Surgical History:  Procedure Laterality Date  . ABDOMINAL HYSTERECTOMY     total  hx of ovarian cancer   . BLADDER SURGERY    . BREAST EXCISIONAL BIOPSY Right    over 20 years ago  . BREAST LUMPECTOMY Left 12/2015  . BREAST SURGERY  30-40 years   hx of removal of calcum build up -right breast   . CESAREAN SECTION  1966, 1968, 1973  . CHOLECYSTECTOMY    . colonscopy     . ENDOVENOUS ABLATION SAPHENOUS VEIN W/ LASER  12-30-2011   left greater saphenous vein  . EVLA  R  GSV  02-10-2011  . EVLA   RIGHT SMALL SAPHENOUS VEIN 09-13-2007  . RADIOACTIVE SEED GUIDED PARTIAL MASTECTOMY WITH AXILLARY SENTINEL LYMPH NODE BIOPSY Left 01/22/2016   Procedure: LEFT BREAST RADIOACTIVE SEED GUIDED PARTIAL MASTECTOMY WITH AXILLARY SENTINEL LYMPH NODE  BIOPSY;  Surgeon: Erroll Luna, MD;  Location: [facility];  Service: General;  Laterality: Left;  . SHOULDER SURGERY     bilat - rotaor cuff repair secondary to tear  . TONSILLECTOMY    . VENTRAL HERNIA REPAIR N/A 07/23/2014   Procedure: VENTRAL INCISIONAL HERNIA REPAIR WITH MESH;  Surgeon: Armandina Gemma, MD;  Location: [facility];  Service: General;  Laterality: N/A;     OB History   No obstetric history on file.     Family History  Problem Relation Age of Onset  . Heart disease Mother   . Hypertension Father   . Heart disease Father   . Breast cancer Sister 30  . Leukemia Brother 11  . Melanoma Brother 25       on his chest  . Prostate cancer Brother 45  . Colon cancer Sister 62  . Melanoma Brother 9  . Diabetes Sister   . Heart disease Sister   . Prostate cancer Brother 57  . Stroke Brother   . Breast cancer Other 50  . Cancer Other        unknown - died quickly  . Breast cancer Cousin        dx 35-40 yrs, maternal first cousin  . Dementia Cousin     Social History   Tobacco Use  . Smoking status: Former Smoker    Years: 2.00    Types: Cigarettes    Quit date: 07/26/1966    Years since quitting: 54.1  . Smokeless tobacco: Never Used  Substance Use Topics  . Alcohol use: No  . Drug use: No    Home Medications Prior to Admission medications   Medication Sig Start Date End Date Taking? Authorizing Provider  anastrozole (ARIMIDEX) 1 MG tablet Take 1 tablet (1 mg total) by mouth daily. 02/07/20  Yes Nicholas Lose, MD  aspirin EC 81 MG tablet Take 81 mg by mouth daily.   Yes [provider]  atenolol (TENORMIN) 100 MG tablet Take 1 tablet (100 mg total) by mouth every morning. 02/03/18  Yes Nicholas Lose, MD  Calcium-Vitamin D-Vitamin K (CALCIUM SOFT CHEWS PO) Take by mouth.   Yes [provider]  cholecalciferol (VITAMIN D) 1000 UNITS tablet Take 1,000 Units by mouth daily.   Yes [provider]  esomeprazole (NEXIUM) 40 MG  capsule Take 40 mg by mouth daily before breakfast.   Yes [provider]  LORazepam (ATIVAN) 1 MG tablet Take 1 mg by mouth daily.   Yes [provider]  Misc Natural Products (OSTEO BI-FLEX/5-LOXIN ADVANCED PO) Take by mouth.   Yes [provider]  Multiple Vitamin (MULTIVITAMIN WITH MINERALS) TABS tablet Take 1 tablet by mouth daily.  Yes [provider]  Phenazopyridine HCl (AZO TABS PO) Take 2 tablets by mouth daily. Pt takes as needed when she has a flare-up   Yes [provider]  pravastatin (PRAVACHOL) 20 MG tablet Take 20 mg by mouth daily.   Yes [provider]  Probiotic Product (PROBIOTIC PO) Take 1 tablet by mouth once as needed (indigestion).   Yes [provider]    Allergies    Codeine and Morphine and related  Review of Systems   Review of Systems  All other systems reviewed and are negative.   Physical Exam Updated Vital Signs BP (!) 182/81   Pulse 65   Temp (!) 97.5 F (36.4 C) (Oral)   Resp 18   SpO2 96%   Physical Exam Vitals and nursing note reviewed. Exam conducted with a chaperone present.  Constitutional:      Appearance: She is not toxic-appearing.  HENT:     Head: Normocephalic and atraumatic.  Eyes:     General: No scleral icterus.    Conjunctiva/sclera: Conjunctivae normal.  Cardiovascular:     Rate and Rhythm: Normal rate and regular rhythm.     Pulses: Normal pulses.  Pulmonary:     Comments: Mildly increased work of breathing and tachypnea.  Saturating well on room air. Abdominal:     General: Abdomen is flat. There is no distension.     Palpations: Abdomen is soft.     Tenderness: There is no abdominal tenderness. There is no guarding.  Musculoskeletal:     Cervical back: Normal range of motion.     Right lower leg: Edema present.     Left lower leg: Edema present.     Comments: 3+ pitting edema bilaterally.  No erythema or unilateral swelling.  Skin:    General: Skin is  dry.  Neurological:     Mental Status: She is alert.     GCS: GCS eye subscore is 4. GCS verbal subscore is 5. GCS motor subscore is 6.  Psychiatric:        Mood and Affect: Mood normal.        Behavior: Behavior normal.        Thought Content: Thought content normal.     ED Results / Procedures / Treatments   Labs (all labs ordered are listed, but only abnormal results are displayed) Labs Reviewed  COMPREHENSIVE METABOLIC PANEL - Abnormal; Notable for the following components:      Result Value   Glucose, Bld 132 (*)    All other components within normal limits  CBG MONITORING, ED - Abnormal; Notable for the following components:   Glucose-Capillary 108 (*)    All other components within normal limits  CBC  BRAIN NATRIURETIC PEPTIDE  TROPONIN I (HIGH SENSITIVITY)  TROPONIN I (HIGH SENSITIVITY)    EKG EKG Interpretation  Date/Time:  Saturday September 20 2020 18:43:11 EST Ventricular Rate:  64 PR Interval:    QRS Duration: 158 QT Interval:  450 QTC Calculation: 465 R Axis:   -23 Text Interpretation: Sinus rhythm Consider left atrial enlargement Left bundle branch block No significant change since prior 6/17 Confirmed by Aletta Edouard [phone]) on 09/20/2020 6:46:10 PM   Radiology DG Chest Portable 1 View  Result Date: 09/20/2020 CLINICAL DATA:  Chest pain EXAM: PORTABLE CHEST 1 VIEW COMPARISON:  July 15, 2014 FINDINGS: The heart size and mediastinal contours are within normal limits. Aortic knob calcifications are seen. There is prominence of the central pulmonary vasculature. Surgical clips are  seen overlying the left lower lung. The visualized skeletal structures are unremarkable. IMPRESSION: Mild pulmonary vascular congestion. Electronically Signed   By: Prudencio Pair M.D.   On: 09/20/2020 19:13    Procedures Procedures   Medications Ordered in ED Medications  nitroGLYCERIN 50 mg in dextrose 5 % 250 mL (0.2 mg/mL) infusion (has no administration in time range)   iohexol (OMNIPAQUE) 350 MG/ML injection 100 mL (100 mLs Intravenous Contrast Given 09/20/20 2030)    ED Course  I have reviewed the triage vital signs and the nursing notes.  Pertinent labs & imaging results that were available during my care of the patient were reviewed by me and considered in my medical decision making (see chart for details).  Clinical Course as of 09/20/20 2040  Sat Sep 20, 2020  1934 She is here for evaluation of exertional chest pressure that is been going on and off for about a week.  Worsened today and resolved so took some nitro with some improvement.  EKG showing left bundle branch block.  Getting labs chest x-ray and likely will need admission for further cardiac work-up. [MB]    Clinical Course User Index [MB] Hayden Rasmussen, MD   MDM Rules/Calculators/A&P                          [facility] was evaluated in Emergency Department on 09/20/2020 for the symptoms described in the history of present illness. She was evaluated in the context of the global COVID-19 pandemic, which necessitated consideration that the patient might be at risk for infection with the SARS-CoV-2 virus that causes COVID-19. Institutional protocols and algorithms that pertain to the evaluation of patients at risk for COVID-19 are in a state of rapid change based on information released by regulatory bodies including the CDC and federal and state organizations. These policies and algorithms were followed during the patient's care in the ED.  I personally reviewed patient's medical chart and all notes from triage and staff during today's encounter. I have also ordered and reviewed all labs and imaging that I felt to be medically necessary in the evaluation of this patient's complaints and with consideration of their physical exam. If needed, translation services were available and utilized.   Patient's history and physical exam concerning for ACS versus CHF exacerbation versus pulmonary  embolism.  Lower suspicion for pulmonary embolism given lack of pleuritic symptoms and clear exertional component that is more concerning for unstable angina or CHF.  Laboratory work-up is all initially unremarkable.  Chest x-ray with mild pulmonary vascular congestion, but without elevated BNP.  Initial troponin within normal meds at 13, will continue to trend.  CBC and CMP unremarkable.  EKG with left bundle branch block and no significant changes when compared to prior.  We will proceed with CTA of the chest to evaluate for pulmonary embolism given her sudden onset chest discomfort and shortness of breath while on oral chemotherapy.  I spoke with Dr. Rudi Rummage, [facility], who recommends admission to hospitalist for unstable angina.  He also is recommending nitroglycerin drip given her elevated blood pressures and ongoing chest tightness and shortness of breath.  He will evaluate the patient once admitted and also determine if heparin needs to be initiated.    I spoke with hospitalist, Dr. Nevada Crane, who will see and admit patient.  Final Clinical Impression(s) / ED Diagnoses Final diagnoses:  Shortness of breath    Rx / DC Orders ED Discharge  Orders    None       Reita Chard 09/20/20 2040    Hayden Rasmussen, MD 09/21/20 [phone]

## 2020-09-20 NOTE — ED Notes (Signed)
Pt states they dont have chest pain, rather it is described as chest tightness.  She states it has 'tightened up a little bit".

## 2020-09-20 NOTE — Consult Note (Signed)
CONSULTATION NOTE   Patient Name: Katrina Young Date of Encounter: 09/20/2020 Cardiologist: No primary care provider on file.  Chief Complaint   Chest pain  Impression   Unstable angina HTN uncontrolled/emergency HLD H/o breast cancer s/p lumpectomy 2017 and radiation,now in remission Obesity   Recommendation   - her history is strongly suggestive of unstable angina. initial trop and EkG negative. BP uncontrolled- 200/80s.  - s/p aspirin, start heparin gtt, start nitro gtt (control BP with a goal of <130/24mmHg). - she still had 5/10 chest pain- my hope is with control of BP and nitro- it'll resolve - start metoprolol tart 25mg  BID, aspirin 81mg , atorva 80mg  daily - continue home meds (stop atenolol and pravastatin). - get ECHO in am - continue to trend trops/ekgs  - covid negative. Keep NPO after MN. If chest pain does not resolve then LHC/CA in am (otherwise on Monday).  Her trop 13->27: NSTEMI  Patient Profile   Katrina Young is a 80 y/o female with PMH HTN, HLD, H/o breast cancer s/p lumpectomy 2017 and radiation,now in remission, Obesity presented to the ER with c/o chest pain  HPI   Katrina Young is a 80 y.o. female who is being seen today for the evaluation of chest pain at the request of ER physician.  Katrina Young is a 80 y/o female with PMH HTN, HLD, H/o breast cancer s/p lumpectomy 2017 and radiation,now in remission, Obesity presented to the ER with c/o chest pain  The patient is very active at baseline, reports she first started having chest tightness while milking the goat first week of February and would go away at rest. She had a few episodes of chest discomfort during exertional activities like this. Today the tightness would not go  Away thus came to the hospital. En route she got SL nitro and aspirin which decreased her pain intensity. The pain is associated with SOB but no orthopnea, or pnd, denies any radiation or other associated symtoms. Does have chronic leg  swelling. She is active otherwise.  ER course: s/p 2 SL nitro- chest pain 5/10, BP 200/80s- starting nitro gtt and heparin gtt. BNP negative. On exam- euvolemic, lungs clear  CXR:  Mild pulm vascular congestion CTA chest: negative for PE  ECG   LBBB (does not meet sgarbossa's criteria) - Personally Reviewed Prior EKG also shows LBBB  Telemetry   On telemetry  Radiology   CT Angio Chest PE W and/or Wo Contrast  Result Date: 09/20/2020 CLINICAL DATA:  High probability of pulmonary embolus. EXAM: CT ANGIOGRAPHY CHEST WITH CONTRAST TECHNIQUE: Multidetector CT imaging of the chest was performed using the standard protocol during bolus administration of intravenous contrast. Multiplanar CT image reconstructions and MIPs were obtained to evaluate the vascular anatomy. CONTRAST:  14mL OMNIPAQUE IOHEXOL 350 MG/ML SOLN COMPARISON:  August 20, 2014. FINDINGS: Cardiovascular: Satisfactory opacification of the pulmonary arteries to the segmental level. No evidence of pulmonary embolism. Mild cardiomegaly is noted. No pericardial effusion. Atherosclerosis of thoracic aorta is noted without aneurysm formation. Mediastinum/Nodes: No enlarged mediastinal, hilar, or axillary lymph nodes. Thyroid gland, trachea, and esophagus demonstrate no significant findings. Lungs/Pleura: No pneumothorax or pleural effusion is noted. Mosaic pattern is seen throughout both upper lobes concerning for air trapping secondary to small airways disease or possibly minimal multifocal inflammation. Minimal bibasilar subsegmental atelectasis is noted. Upper Abdomen: No acute abnormality. Musculoskeletal: No chest wall abnormality. No acute or significant osseous findings. Review of the MIP images confirms the above findings. IMPRESSION:  1. No definite evidence of pulmonary embolus. 2. Mosaic pattern is seen throughout both upper lobes concerning for air trapping secondary to small airways disease or possibly minimal multifocal  inflammation. 3. Minimal bibasilar subsegmental atelectasis is noted. 4. Aortic atherosclerosis. Aortic Atherosclerosis (ICD10-I70.0). Electronically Signed   By: Marijo Conception M.D.   On: 09/20/2020 20:46   DG Chest Portable 1 View  Result Date: 09/20/2020 CLINICAL DATA:  Chest pain EXAM: PORTABLE CHEST 1 VIEW COMPARISON:  July 15, 2014 FINDINGS: The heart size and mediastinal contours are within normal limits. Aortic knob calcifications are seen. There is prominence of the central pulmonary vasculature. Surgical clips are seen overlying the left lower lung. The visualized skeletal structures are unremarkable. IMPRESSION: Mild pulmonary vascular congestion. Electronically Signed   By: Prudencio Pair M.D.   On: 09/20/2020 19:13    Cardiac Studies   ECHO pending  PMHx   Past Medical History:  Diagnosis Date  . Arthritis    legs, back. neck  . Breast cancer ([place])   . Breast cancer of lower-outer quadrant of left female breast ([place]) 12/11/2015  . Cancer ([place])    ovarian cancer (hysterectomy-12-14 years ago) no txs  . Complication of anesthesia   . Constipation   . Diminished eyesight change in eyesight   cataracts  . Family history of breast cancer   . Family history of colon cancer   . Family history of melanoma   . Family history of prostate cancer   . GERD (gastroesophageal reflux disease)   . 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 Hyperlipidemia   . Hypertension   . Joint pain   . Leg pain   . Personal history of radiation therapy   . PONV (postoperative nausea and vomiting)   . Reflux   . Shortness of breath dyspnea    increased walking or climbing stairs  . Varicose veins   . Weight gain     Past Surgical History:  Procedure Laterality Date  . ABDOMINAL HYSTERECTOMY     total hx of ovarian cancer   . BLADDER SURGERY    . BREAST EXCISIONAL BIOPSY Right    over 20 years ago  . BREAST LUMPECTOMY Left 12/2015  . BREAST SURGERY  30-40 years   hx of removal of  calcum build up -right breast   . CESAREAN SECTION  1966, 1968, 1973  . CHOLECYSTECTOMY    . colonscopy     . ENDOVENOUS ABLATION SAPHENOUS VEIN W/ LASER  12-30-2011   left greater saphenous vein  . EVLA  R  GSV  02-10-2011  . EVLA   RIGHT SMALL SAPHENOUS VEIN 09-13-2007  . RADIOACTIVE SEED GUIDED PARTIAL MASTECTOMY WITH AXILLARY SENTINEL LYMPH NODE BIOPSY Left 01/22/2016   Procedure: LEFT BREAST RADIOACTIVE SEED GUIDED PARTIAL MASTECTOMY WITH AXILLARY SENTINEL LYMPH NODE BIOPSY;  Surgeon: Erroll Luna, MD;  Location: [facility];  Service: General;  Laterality: Left;  . SHOULDER SURGERY     bilat - rotaor cuff repair secondary to tear  . TONSILLECTOMY    . VENTRAL HERNIA REPAIR N/A 07/23/2014   Procedure: VENTRAL INCISIONAL HERNIA REPAIR WITH MESH;  Surgeon: Armandina Gemma, MD;  Location: [facility];  Service: General;  Laterality: N/A;    FAMHx   Family History  Problem Relation Age of Onset  . Heart disease Mother   . Hypertension Father   . Heart disease Father   . Breast cancer Sister 38  . Leukemia Brother 11  . Melanoma Brother  25       on his chest  . Prostate cancer Brother 42  . Colon cancer Sister 73  . Melanoma Brother 45  . Diabetes Sister   . Heart disease Sister   . Prostate cancer Brother 50  . Stroke Brother   . Breast cancer Other 50  . Cancer Other        unknown - died quickly  . Breast cancer Cousin        dx 35-40 yrs, maternal first cousin  . Dementia Cousin     SOCHx    reports that she quit smoking about 54 years ago. Her smoking use included cigarettes. She quit after 2.00 years of use. She has never used smokeless tobacco. She reports that she does not drink alcohol and does not use drugs.  Outpatient Medications   No current facility-administered medications on file prior to encounter.   Current Outpatient Medications on File Prior to Encounter  Medication Sig Dispense Refill  . anastrozole (ARIMIDEX) 1 MG tablet Take 1 tablet  (1 mg total) by mouth daily. 90 tablet 3  . aspirin EC 81 MG tablet Take 81 mg by mouth daily.    Marland Kitchen atenolol (TENORMIN) 100 MG tablet Take 1 tablet (100 mg total) by mouth every morning.    . Calcium-Vitamin D-Vitamin K (CALCIUM SOFT CHEWS PO) Take by mouth.    . cholecalciferol (VITAMIN D) 1000 UNITS tablet Take 1,000 Units by mouth daily.    Marland Kitchen esomeprazole (NEXIUM) 40 MG capsule Take 40 mg by mouth daily before breakfast.    . LORazepam (ATIVAN) 1 MG tablet Take 1 mg by mouth daily.    . Misc Natural Products (OSTEO BI-FLEX/5-LOXIN ADVANCED PO) Take by mouth.    . Multiple Vitamin (MULTIVITAMIN WITH MINERALS) TABS tablet Take 1 tablet by mouth daily.    . Phenazopyridine HCl (AZO TABS PO) Take 2 tablets by mouth daily. Pt takes as needed when she has a flare-up    . pravastatin (PRAVACHOL) 20 MG tablet Take 20 mg by mouth daily.    . Probiotic Product (PROBIOTIC PO) Take 1 tablet by mouth once as needed (indigestion).      Inpatient Medications    Scheduled Meds: . [START ON 09/21/2020] anastrozole  1 mg Oral Daily  . aspirin EC  81 mg Oral Daily  . [START ON 09/21/2020] atenolol  100 mg Oral q morning  . cholecalciferol  1,000 Units Oral Daily  . heparin  4,000 Units Intravenous Once  . [START ON 09/21/2020] LORazepam  1 mg Oral Daily  . multivitamin with minerals  1 tablet Oral Daily  . pantoprazole  40 mg Oral Daily  . pravastatin  20 mg Oral Daily    Continuous Infusions: . sodium chloride 30 mL/hr at 09/20/20 2209  . heparin    . nitroGLYCERIN 5 mcg/min (09/20/20 2208)    PRN Meds:    ALLERGIES   Allergies  Allergen Reactions  . Codeine Nausea Only  . Morphine And Related Nausea Only    ROS   Pertinent items noted in HPI and remainder of comprehensive ROS otherwise negative.  Vitals   Vitals:   09/20/20 2100 09/20/20 2130 09/20/20 2145 09/20/20 2200  BP:  (!) 188/73 (!) 186/81 (!) 191/75  Pulse:  60 64 66  Resp:  15 20 19   Temp:      TempSrc:      SpO2:   95% 95% 96%  Weight: 79.9 kg     Height:  5\' 3"  (1.6 m)      No intake or output data in the 24 hours ending 09/20/20 2210 Filed Weights   09/20/20 2100  Weight: 79.9 kg    Physical Exam   HEENT: Normocephalic and atraumatic.  Neck: Supple. No carotid bruits. No JVD. Heart: regular rate rhythm, normal S1 and S2.no murmurs No gallops or rubs. Radial and distal pedal pulses 2+ and equal bilaterally. Lungs: clear to auscultation b/l, no crackles, wheeze Abdomen: Soft, non-distended, and non-tender to palpation. Bowel sounds present. Extremities: 2+ lower extremity edema.    Skin: Warm and dry. Neuro: Alert,oriented x3, No focal deficits. Psych: Normal affect. Responds appropriately.  Labs   Results for orders placed or performed during the hospital encounter of 09/20/20 (from the past 48 hour(s))  Troponin I (High Sensitivity)     Status: None   Collection Time: 09/20/20  7:05 PM  Result Value Ref Range   Troponin I (High Sensitivity) 13 <18 ng/L    Comment: (NOTE) Elevated high sensitivity troponin I (hsTnI) values and significant  changes across serial measurements may suggest ACS but many other  chronic and acute conditions are known to elevate hsTnI results.  Refer to the "Links" section for chest pain algorithms and additional  guidance. Performed at [facility], [place] [address], [place], [place] [address]   CBC     Status: None   Collection Time: 09/20/20  7:05 PM  Result Value Ref Range   WBC 6.6 4.0 - 10.5 K/uL   RBC 4.62 3.87 - 5.11 MIL/uL   Hemoglobin 13.6 12.0 - 15.0 g/dL   HCT 40.4 36.0 - 46.0 %   MCV 87.4 80.0 - 100.0 fL   MCH 29.4 26.0 - 34.0 pg   MCHC 33.7 30.0 - 36.0 g/dL   RDW 12.2 11.5 - 15.5 %   Platelets 203 150 - 400 K/uL   nRBC 0.0 0.0 - 0.2 %    Comment: Performed at [facility], [place] [address] [place], [place] [address]  Comprehensive metabolic panel     Status: Abnormal   Collection Time: 09/20/20  7:05 PM  Result Value Ref  Range   Sodium 140 135 - 145 mmol/L   Potassium 4.0 3.5 - 5.1 mmol/L   Chloride 105 98 - 111 mmol/L   CO2 23 22 - 32 mmol/L   Glucose, Bld 132 (H) 70 - 99 mg/dL    Comment: Glucose reference range applies only to samples taken after fasting for at least 8 hours.   BUN 17 8 - 23 mg/dL   Creatinine, Ser 0.89 0.44 - 1.00 mg/dL   Calcium 9.3 8.9 - 10.3 mg/dL   Total Protein 6.6 6.5 - 8.1 g/dL   Albumin 3.8 3.5 - 5.0 g/dL   AST 28 15 - 41 U/L   ALT 28 0 - 44 U/L   Alkaline Phosphatase 116 38 - 126 U/L   Total Bilirubin 0.7 0.3 - 1.2 mg/dL   GFR, Estimated >60 >60 mL/min    Comment: (NOTE) Calculated using the CKD-EPI Creatinine Equation (2021)    Anion gap 12 5 - 15    Comment: Performed at [facility] [address], [place], [place] [address]  Brain natriuretic peptide (order if patient c/o SOB ONLY)     Status: None   Collection Time: 09/20/20  7:05 PM  Result Value Ref Range   B Natriuretic Peptide 93.4 0.0 - 100.0 pg/mL    Comment: Performed at  [facility], [place] [address] [place], [place] [address]  CBG monitoring, ED     Status: Abnormal   Collection Time: 09/20/20  7:22 PM  Result Value Ref Range   Glucose-Capillary 108 (H) 70 - 99 mg/dL    Comment: Glucose reference range applies only to samples taken after fasting for at least 8 hours.   Comment 1 Notify RN    Comment 2 Document in Chart   Troponin I (High Sensitivity)     Status: Abnormal   Collection Time: 09/20/20  8:50 PM  Result Value Ref Range   Troponin I (High Sensitivity) 27 (H) <18 ng/L    Comment: (NOTE) Elevated high sensitivity troponin I (hsTnI) values and significant  changes across serial measurements may suggest ACS but many other  chronic and acute conditions are known to elevate hsTnI results.  Refer to the "Links" section for chest pain algorithms and additional  guidance. Performed at [facility], [place] [address] [place], [place] [address]   CBC     Status: None    Collection Time: 09/20/20  8:50 PM  Result Value Ref Range   WBC 8.0 4.0 - 10.5 K/uL   RBC 4.55 3.87 - 5.11 MIL/uL   Hemoglobin 13.6 12.0 - 15.0 g/dL   HCT 39.0 36.0 - 46.0 %   MCV 85.7 80.0 - 100.0 fL   MCH 29.9 26.0 - 34.0 pg   MCHC 34.9 30.0 - 36.0 g/dL   RDW 12.3 11.5 - 15.5 %   Platelets 160 150 - 400 K/uL   nRBC 0.0 0.0 - 0.2 %    Comment: Performed at [facility], [place] [address] [place], [place] [address]        Length of Stay:  LOS: 0 days      Renae Fickle 09/20/2020, 10:10 PM

## 2020-09-20 NOTE — H&P (Addendum)
History and Physical  EMARA LICHTER VOH:[id number] DOB: 03-24-1941 DOA: 09/20/2020  Referring physician: Krista Blue, PA, [place]. PCP: Leanna Battles, MD  Outpatient Specialists: Cardiology, oncology. Patient coming from: Home.  Chief Complaint: Chest tightness.  HPI: Katrina Young is a 80 y.o. female with medical history significant for breast and ovarian cancer on anastrozole, chronic anxiety, essential hypertension, hyperlipidemia who presented to [facility] ED with complaints of sudden onset chest tightness while milking her goat, persisted when she stopped, and at rest.  Associated with shortness of breath.  The tightness was 10 out of 10 centrally located.  Similar symptoms occurred at least once for about a week, her chest tightness was milder, not to this severity.  They also occurred while milking a goat which she is accustomed to doing.  She took a full dose of aspirin 324 mg and 1 mg of Ativan at home.  She has not seen her cardiologist, Dr. Martinique, in more than 10 years.  She called EMS, received 2 sublingual nitroglycerin with improvement of her symptomatology.  She was brought in for further evaluation and management.  In the ED she continues to have chest tightness and was started on nitro drip.  EDP discussed with cardiology who requested admission by hospitalist team.    Personally discussed with cardiology Dr. Rudi Rummage in the ED who recommended starting heparin drip due to concern for ACS and n.p.o. after midnight.  ED Course: BP not at goal 192/78, other vitals and labs unremarkable.  12-lead EKG sinus rhythm with no specific ST-T changes.  Review of Systems: Review of systems as noted in the HPI. All other systems reviewed and are negative.   Past Medical History:  Diagnosis Date  . Arthritis    legs, back. neck  . Breast cancer ([place])   . Breast cancer of lower-outer quadrant of left female breast ([place]) 12/11/2015  . Cancer ([place])    ovarian cancer (hysterectomy-12-14 years  ago) no txs  . Complication of anesthesia   . Constipation   . Diminished eyesight change in eyesight   cataracts  . Family history of breast cancer   . Family history of colon cancer   . Family history of melanoma   . Family history of prostate cancer   . GERD (gastroesophageal reflux disease)   . 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 Hyperlipidemia   . Hypertension   . Joint pain   . Leg pain   . Personal history of radiation therapy   . PONV (postoperative nausea and vomiting)   . Reflux   . Shortness of breath dyspnea    increased walking or climbing stairs  . Varicose veins   . Weight gain    Past Surgical History:  Procedure Laterality Date  . ABDOMINAL HYSTERECTOMY     total hx of ovarian cancer   . BLADDER SURGERY    . BREAST EXCISIONAL BIOPSY Right    over 20 years ago  . BREAST LUMPECTOMY Left 12/2015  . BREAST SURGERY  30-40 years   hx of removal of calcum build up -right breast   . CESAREAN SECTION  1966, 1968, 1973  . CHOLECYSTECTOMY    . colonscopy     . ENDOVENOUS ABLATION SAPHENOUS VEIN W/ LASER  12-30-2011   left greater saphenous vein  . EVLA  R  GSV  02-10-2011  . EVLA   RIGHT SMALL SAPHENOUS VEIN 09-13-2007  . RADIOACTIVE SEED GUIDED PARTIAL MASTECTOMY WITH AXILLARY SENTINEL LYMPH NODE BIOPSY Left 01/22/2016  Procedure: LEFT BREAST RADIOACTIVE SEED GUIDED PARTIAL MASTECTOMY WITH AXILLARY SENTINEL LYMPH NODE BIOPSY;  Surgeon: Erroll Luna, MD;  Location: [place];  Service: General;  Laterality: Left;  . SHOULDER SURGERY     bilat - rotaor cuff repair secondary to tear  . TONSILLECTOMY    . VENTRAL HERNIA REPAIR N/A 07/23/2014   Procedure: VENTRAL INCISIONAL HERNIA REPAIR WITH MESH;  Surgeon: Armandina Gemma, MD;  Location: [facility];  Service: General;  Laterality: N/A;    Social History:  reports that she quit smoking about 54 years ago. Her smoking use included cigarettes. She quit after 2.00 years of use. She has never used  smokeless tobacco. She reports that she does not drink alcohol and does not use drugs.   Allergies  Allergen Reactions  . Codeine Nausea Only  . Morphine And Related Nausea Only    Family History  Problem Relation Age of Onset  . Heart disease Mother   . Hypertension Father   . Heart disease Father   . Breast cancer Sister 47  . Leukemia Brother 11  . Melanoma Brother 25       on his chest  . Prostate cancer Brother 48  . Colon cancer Sister 27  . Melanoma Brother 88  . Diabetes Sister   . Heart disease Sister   . Prostate cancer Brother 76  . Stroke Brother   . Breast cancer Other 50  . Cancer Other        unknown - died quickly  . Breast cancer Cousin        dx 35-40 yrs, maternal first cousin  . Dementia Cousin       Prior to Admission medications   Medication Sig Start Date End Date Taking? Authorizing Provider  anastrozole (ARIMIDEX) 1 MG tablet Take 1 tablet (1 mg total) by mouth daily. 02/07/20  Yes Nicholas Lose, MD  aspirin EC 81 MG tablet Take 81 mg by mouth daily.   Yes [provider]  atenolol (TENORMIN) 100 MG tablet Take 1 tablet (100 mg total) by mouth every morning. 02/03/18  Yes Nicholas Lose, MD  Calcium-Vitamin D-Vitamin K (CALCIUM SOFT CHEWS PO) Take by mouth.   Yes [provider]  cholecalciferol (VITAMIN D) 1000 UNITS tablet Take 1,000 Units by mouth daily.   Yes [provider]  esomeprazole (NEXIUM) 40 MG capsule Take 40 mg by mouth daily before breakfast.   Yes [provider]  LORazepam (ATIVAN) 1 MG tablet Take 1 mg by mouth daily.   Yes [provider]  Misc Natural Products (OSTEO BI-FLEX/5-LOXIN ADVANCED PO) Take by mouth.   Yes [provider]  Multiple Vitamin (MULTIVITAMIN WITH MINERALS) TABS tablet Take 1 tablet by mouth daily.   Yes [provider]  Phenazopyridine HCl (AZO TABS PO) Take 2 tablets by mouth daily. Pt takes as needed when she has a flare-up   Yes [provider]  pravastatin (PRAVACHOL) 20 MG tablet Take 20 mg by mouth daily.   Yes [provider]  Probiotic Product (PROBIOTIC PO) Take 1 tablet by mouth once as needed (indigestion).   Yes [provider]    Physical Exam: BP (!) 182/81   Pulse 65   Temp (!) 97.5 F (36.4 C) (Oral)   Resp 18   Ht 5\' 3"  (1.6 m)   Wt 79.9 kg   SpO2 96%   BMI 31.20 kg/m   . General: 80 y.o. year-old female well developed well nourished  in no acute distress.  Alert and oriented x3. . Cardiovascular: Regular rate and rhythm with no rubs or gallops.  No thyromegaly or JVD noted.  Trace lower extremity edema. 2/4 pulses in all 4 extremities. Marland Kitchen Respiratory: Clear to auscultation with no wheezes or rales. Good inspiratory effort. . Abdomen: Soft nontender nondistended with normal bowel sounds x4 quadrants. . Muskuloskeletal: No cyanosis or clubbing.  Trace edema noted bilaterally in lower extremities. . Neuro: CN II-XII intact, strength, sensation, reflexes . Skin: No ulcerative lesions noted or rashes. . Psychiatry: Judgement and insight appear normal. Mood is appropriate for condition and setting.          Labs on Admission:  Basic Metabolic Panel: Recent Labs  Lab 09/20/20 1905  NA 140  K 4.0  CL 105  CO2 23  GLUCOSE 132*  BUN 17  CREATININE 0.89  CALCIUM 9.3   Liver Function Tests: Recent Labs  Lab 09/20/20 1905  AST 28  ALT 28  ALKPHOS 116  BILITOT 0.7  PROT 6.6  ALBUMIN 3.8   No results for input(s): LIPASE, AMYLASE in the last 168 hours. No results for input(s): AMMONIA in the last 168 hours. CBC: Recent Labs  Lab 09/20/20 1905  WBC 6.6  HGB 13.6  HCT 40.4  MCV 87.4  PLT 203   Cardiac Enzymes: No results for input(s): CKTOTAL, CKMB, CKMBINDEX, TROPONINI in the last 168 hours.  BNP (last 3 results) Recent Labs    09/20/20 1905  BNP 93.4    ProBNP (last 3 results) No results for input(s): PROBNP in the last 8760 hours.  CBG: Recent  Labs  Lab 09/20/20 1922  GLUCAP 108*    Radiological Exams on Admission: CT Angio Chest PE W and/or Wo Contrast  Result Date: 09/20/2020 CLINICAL DATA:  High probability of pulmonary embolus. EXAM: CT ANGIOGRAPHY CHEST WITH CONTRAST TECHNIQUE: Multidetector CT imaging of the chest was performed using the standard protocol during bolus administration of intravenous contrast. Multiplanar CT image reconstructions and MIPs were obtained to evaluate the vascular anatomy. CONTRAST:  139mL OMNIPAQUE IOHEXOL 350 MG/ML SOLN COMPARISON:  August 20, 2014. FINDINGS: Cardiovascular: Satisfactory opacification of the pulmonary arteries to the segmental level. No evidence of pulmonary embolism. Mild cardiomegaly is noted. No pericardial effusion. Atherosclerosis of thoracic aorta is noted without aneurysm formation. Mediastinum/Nodes: No enlarged mediastinal, hilar, or axillary lymph nodes. Thyroid gland, trachea, and esophagus demonstrate no significant findings. Lungs/Pleura: No pneumothorax or pleural effusion is noted. Mosaic pattern is seen throughout both upper lobes concerning for air trapping secondary to small airways disease or possibly minimal multifocal inflammation. Minimal bibasilar subsegmental atelectasis is noted. Upper Abdomen: No acute abnormality. Musculoskeletal: No chest wall abnormality. No acute or significant osseous findings. Review of the MIP images confirms the above findings. IMPRESSION: 1. No definite evidence of pulmonary embolus. 2. Mosaic pattern is seen throughout both upper lobes concerning for air trapping secondary to small airways disease or possibly minimal multifocal inflammation. 3. Minimal bibasilar subsegmental atelectasis is noted. 4. Aortic atherosclerosis. Aortic Atherosclerosis (ICD10-I70.0). Electronically Signed   By: Marijo Conception M.D.   On: 09/20/2020 20:46   DG Chest Portable 1 View  Result Date: 09/20/2020 CLINICAL DATA:  Chest pain EXAM: PORTABLE CHEST 1 VIEW  COMPARISON:  July 15, 2014 FINDINGS: The heart size and mediastinal contours are within normal limits. Aortic knob calcifications are seen. There is prominence of the central pulmonary vasculature. Surgical clips are seen overlying the left lower lung. The visualized skeletal structures are  unremarkable. IMPRESSION: Mild pulmonary vascular congestion. Electronically Signed   By: Prudencio Pair M.D.   On: 09/20/2020 19:13    EKG: I independently viewed the EKG done and my findings are as followed: Sinus rhythm rate of 64.  No specific ST-T changes.  Assessment/Plan Present on Admission: . Chest pain  Active Problems:   Chest pain  Chest pain rule out ACS Presented with sudden onset severe chest tightness with exertion.  Associated with dyspnea.  Symptomatology relieved with 2 sublingual nitroglycerin. Symptoms ongoing for about a week. Twelve-lead EKG unremarkable for acute ischemia. First set of troponin negative. Cycle troponin x2 Obtain 2D echo Repeat twelve-lead EKG in the morning. Started heparin drip at cardiologist request, n.p.o. after midnight. Due to possible right/left heart cath in the morning, will start her on gentle IV fluid hydration normal saline at 30 cc/h to avoid IV contrast nephropathy. She is currently on nitro drip started in the ED. Restart home aspirin and pravastatin. CTA chest ordered by EDP is pending, follow results Cardiology, Dr. Rudi Rummage, consulted by EDP.  Essential hypertension BP is not at goal Resume home atenolol Closely monitor vital signs while on nitro drip.  Hyperlipidemia Obtain fasting lipid panel in the morning Continue home pravastatin.  Chronic anxiety Resume home Ativan  GERD Resume home PPI.  History of breast cancer and ovarian cancer Resume home anastrozole.     DVT prophylaxis: Heparin drip as requested by cardiology  Code Status: Full code as stated by the patient herself.  Family Communication: Husband at  bedside.  All questions answered to the best of my ability.  Disposition Plan: Likely will discharge to home on 09/21/2020 if ACS is ruled out.  Consults called: Cardiology consulted by EDP.  Admission status: Observation status.   Status is: Observation    Dispo:  Patient From: Home  Planned Disposition: Home  Medically stable for discharge: No, ongoing management of chest pain, need to rule out ACS.      Kayleen Memos MD [facility] Pager [phone]  If 7PM-7AM, please contact night-coverage [email or web address] Password [facility]  09/20/2020, 9:35 PM

## 2020-09-20 NOTE — ED Triage Notes (Signed)
Pt BIB [facility] for dyspnea and Chest Pressure on exertion.  It has been happening for about a week but worsened around 1630 this afternoon while milking a goat.  Pt has a hx of breast and ovarian cancer and HTN.  SHe has had a cardiologist in the past but hasnt seen one recently.  Pt took her own 324 ASA and 1 mg Ativan at home, EMS gave 2 SL nitro which reduced her pain from a 10/10-6/10.  Pt had forgotten to mention that she had a mastectomy with lymph node removal on her left side about 78yrs ago.  Consequently, EMS placed an 18G IV on the left side. It was removed on arrival.

## 2020-09-21 ENCOUNTER — Observation Stay (HOSPITAL_COMMUNITY): Payer: Medicare Other

## 2020-09-21 DIAGNOSIS — R0602 Shortness of breath: Secondary | ICD-10-CM | POA: Diagnosis not present

## 2020-09-21 DIAGNOSIS — Z853 Personal history of malignant neoplasm of breast: Secondary | ICD-10-CM | POA: Diagnosis not present

## 2020-09-21 DIAGNOSIS — K5900 Constipation, unspecified: Secondary | ICD-10-CM | POA: Diagnosis present

## 2020-09-21 DIAGNOSIS — E785 Hyperlipidemia, unspecified: Secondary | ICD-10-CM | POA: Diagnosis not present

## 2020-09-21 DIAGNOSIS — Z8543 Personal history of malignant neoplasm of ovary: Secondary | ICD-10-CM | POA: Diagnosis not present

## 2020-09-21 DIAGNOSIS — Z79811 Long term (current) use of aromatase inhibitors: Secondary | ICD-10-CM | POA: Diagnosis not present

## 2020-09-21 DIAGNOSIS — I340 Nonrheumatic mitral (valve) insufficiency: Secondary | ICD-10-CM

## 2020-09-21 DIAGNOSIS — F419 Anxiety disorder, unspecified: Secondary | ICD-10-CM | POA: Diagnosis not present

## 2020-09-21 DIAGNOSIS — I200 Unstable angina: Secondary | ICD-10-CM | POA: Diagnosis not present

## 2020-09-21 DIAGNOSIS — I10 Essential (primary) hypertension: Secondary | ICD-10-CM

## 2020-09-21 DIAGNOSIS — Z8249 Family history of ischemic heart disease and other diseases of the circulatory system: Secondary | ICD-10-CM | POA: Diagnosis not present

## 2020-09-21 DIAGNOSIS — E669 Obesity, unspecified: Secondary | ICD-10-CM | POA: Diagnosis not present

## 2020-09-21 DIAGNOSIS — K219 Gastro-esophageal reflux disease without esophagitis: Secondary | ICD-10-CM | POA: Diagnosis not present

## 2020-09-21 DIAGNOSIS — I214 Non-ST elevation (NSTEMI) myocardial infarction: Secondary | ICD-10-CM | POA: Diagnosis not present

## 2020-09-21 DIAGNOSIS — R079 Chest pain, unspecified: Secondary | ICD-10-CM | POA: Diagnosis not present

## 2020-09-21 DIAGNOSIS — H269 Unspecified cataract: Secondary | ICD-10-CM | POA: Diagnosis present

## 2020-09-21 DIAGNOSIS — Z803 Family history of malignant neoplasm of breast: Secondary | ICD-10-CM | POA: Diagnosis not present

## 2020-09-21 DIAGNOSIS — I447 Left bundle-branch block, unspecified: Secondary | ICD-10-CM | POA: Diagnosis present

## 2020-09-21 DIAGNOSIS — M1990 Unspecified osteoarthritis, unspecified site: Secondary | ICD-10-CM | POA: Diagnosis present

## 2020-09-21 DIAGNOSIS — Z20822 Contact with and (suspected) exposure to covid-19: Secondary | ICD-10-CM | POA: Diagnosis not present

## 2020-09-21 DIAGNOSIS — I361 Nonrheumatic tricuspid (valve) insufficiency: Secondary | ICD-10-CM

## 2020-09-21 DIAGNOSIS — Z8042 Family history of malignant neoplasm of prostate: Secondary | ICD-10-CM | POA: Diagnosis not present

## 2020-09-21 DIAGNOSIS — Z923 Personal history of irradiation: Secondary | ICD-10-CM | POA: Diagnosis not present

## 2020-09-21 DIAGNOSIS — Z90710 Acquired absence of both cervix and uterus: Secondary | ICD-10-CM | POA: Diagnosis not present

## 2020-09-21 DIAGNOSIS — I5189 Other ill-defined heart diseases: Secondary | ICD-10-CM | POA: Diagnosis not present

## 2020-09-21 DIAGNOSIS — Z808 Family history of malignant neoplasm of other organs or systems: Secondary | ICD-10-CM | POA: Diagnosis not present

## 2020-09-21 DIAGNOSIS — M7989 Other specified soft tissue disorders: Secondary | ICD-10-CM | POA: Diagnosis present

## 2020-09-21 DIAGNOSIS — Z6829 Body mass index (BMI) 29.0-29.9, adult: Secondary | ICD-10-CM | POA: Diagnosis not present

## 2020-09-21 DIAGNOSIS — Z806 Family history of leukemia: Secondary | ICD-10-CM | POA: Diagnosis not present

## 2020-09-21 LAB — BASIC METABOLIC PANEL
Anion gap: 10 (ref 5–15)
BUN: 15 mg/dL (ref 8–23)
CO2: 25 mmol/L (ref 22–32)
Calcium: 9.4 mg/dL (ref 8.9–10.3)
Chloride: 107 mmol/L (ref 98–111)
Creatinine, Ser: 0.81 mg/dL (ref 0.44–1.00)
GFR, Estimated: >60 mL/min (ref >60)
Glucose, Bld: 125 mg/dL — ABNORMAL HIGH (ref 70–99)
Potassium: 3.9 mmol/L (ref 3.5–5.1)
Sodium: 142 mmol/L (ref 135–145)

## 2020-09-21 LAB — LIPID PANEL
Cholesterol: 158 mg/dL (ref 0–200)
HDL: 55 mg/dL (ref >40)
LDL Cholesterol: 73 mg/dL (ref 0–99)
Total CHOL/HDL Ratio: 2.9 RATIO
Triglycerides: 149 mg/dL (ref <150)
VLDL: 30 mg/dL (ref 0–40)

## 2020-09-21 LAB — ECHOCARDIOGRAM COMPLETE
Area-P 1/2: 3.31 cm2
Calc EF: 50.2 %
Height: 63 in
MV M vel: 5.09 m/s
MV Peak grad: 103.6 mmHg
S' Lateral: 3 cm
Single Plane A2C EF: 50.7 %
Single Plane A4C EF: 46.6 %
Weight: 2678.4 oz

## 2020-09-21 LAB — TROPONIN I (HIGH SENSITIVITY)
Troponin I (High Sensitivity): 44 ng/L — ABNORMAL HIGH (ref <18)
Troponin I (High Sensitivity): 22 ng/L — ABNORMAL HIGH (ref <18)
Troponin I (High Sensitivity): 23 ng/L — ABNORMAL HIGH (ref <18)

## 2020-09-21 LAB — CBC
HCT: 40.3 % (ref 36.0–46.0)
Hemoglobin: 13.7 g/dL (ref 12.0–15.0)
MCH: 28.9 pg (ref 26.0–34.0)
MCHC: 34 g/dL (ref 30.0–36.0)
MCV: 85 fL (ref 80.0–100.0)
Platelets: 213 10*3/uL (ref 150–400)
RBC: 4.74 MIL/uL (ref 3.87–5.11)
RDW: 12.2 % (ref 11.5–15.5)
WBC: 8.2 10*3/uL (ref 4.0–10.5)
nRBC: 0 % (ref 0.0–0.2)

## 2020-09-21 LAB — SARS CORONAVIRUS 2 (TAT 6-24 HRS): SARS Coronavirus 2: NEGATIVE

## 2020-09-21 LAB — HEPARIN LEVEL (UNFRACTIONATED)
Heparin Unfractionated: 0.12 IU/mL — ABNORMAL LOW (ref 0.30–0.70)
Heparin Unfractionated: 0.17 IU/mL — ABNORMAL LOW (ref 0.30–0.70)

## 2020-09-21 MED ORDER — HEPARIN BOLUS VIA INFUSION
2000.0000 [IU] | Freq: Once | INTRAVENOUS | Status: AC
  Administered 2020-09-21: 2000 [IU] via INTRAVENOUS
  Filled 2020-09-21: qty 2000

## 2020-09-21 MED ORDER — ACETAMINOPHEN 325 MG PO TABS: 650.0000 mg | ORAL_TABLET | Freq: Four times a day (QID) | ORAL | Status: DC | PRN

## 2020-09-21 MED ORDER — ACETAMINOPHEN 325 MG PO TABS
650.0000 mg | ORAL_TABLET | Freq: Four times a day (QID) | ORAL | Status: DC | PRN
  Administered 2020-09-21 – 2020-09-22 (×2): 650 mg via ORAL
  Filled 2020-09-21 (×2): qty 2

## 2020-09-21 MED ORDER — SODIUM CHLORIDE 0.9 % IV SOLN: 250.0000 mL | INTRAVENOUS | Status: DC | PRN

## 2020-09-21 MED ORDER — LOSARTAN POTASSIUM 25 MG PO TABS
25.0000 mg | ORAL_TABLET | Freq: Every day | ORAL | Status: DC
  Administered 2020-09-21 – 2020-09-22 (×2): 25 mg via ORAL
  Filled 2020-09-21 (×2): qty 1

## 2020-09-21 MED ORDER — SODIUM CHLORIDE 0.9% FLUSH: 3.0000 mL | INTRAVENOUS | Status: DC | PRN

## 2020-09-21 MED ORDER — SODIUM CHLORIDE 0.9% FLUSH
3.0000 mL | Freq: Two times a day (BID) | INTRAVENOUS | Status: DC
  Administered 2020-09-21: 3 mL via INTRAVENOUS

## 2020-09-21 MED ORDER — SODIUM CHLORIDE 0.9 % WEIGHT BASED INFUSION
3.0000 mL/kg/h | INTRAVENOUS | Status: AC
  Administered 2020-09-22: 3 mL/kg/h via INTRAVENOUS

## 2020-09-21 MED ORDER — ASPIRIN 81 MG PO CHEW
81.0000 mg | CHEWABLE_TABLET | ORAL | Status: AC
  Administered 2020-09-22: 81 mg via ORAL
  Filled 2020-09-21: qty 1

## 2020-09-21 MED ORDER — SODIUM CHLORIDE 0.9 % WEIGHT BASED INFUSION
1.0000 mL/kg/h | INTRAVENOUS | Status: DC
  Administered 2020-09-22: 1 mL/kg/h via INTRAVENOUS

## 2020-09-21 NOTE — Plan of Care (Signed)
  Problem: Activity: Goal: Risk for activity intolerance will decrease Outcome: Progressing   Problem: Coping: Goal: Level of anxiety will decrease Outcome: Progressing   Problem: Safety: Goal: Ability to remain free from injury will improve Outcome: Progressing   

## 2020-09-21 NOTE — Progress Notes (Signed)
[place] for Heparin Indication: chest pain/ACS  Allergies  Allergen Reactions  . Codeine Nausea Only  . Morphine And Related Nausea Only    Patient Measurements: Height: 5\' 3"  (160 cm) Weight: 75.9 kg (167 lb 6.4 oz) IBW/kg (Calculated) : 52.4 Heparin Dosing Weight: 69.8 kg  Vital Signs: Temp: 97.8 F (36.6 C) (02/27 0906) Temp Source: Oral (02/27 0906) BP: 169/71 (02/27 0906) Pulse Rate: 64 (02/27 0906)  Labs: Recent Labs    09/20/20 1905 09/20/20 2050 09/21/20 0049 09/21/20 0854  HGB 13.6 13.6 13.7  --   HCT 40.4 39.0 40.3  --   PLT 203 160 213  --   HEPARINUNFRC  --   --   --  0.12*  CREATININE 0.89  --  0.81  --   TROPONINIHS 13 27* 44*  --     Estimated Creatinine Clearance: 54.9 mL/min (by C-G formula based on SCr of 0.81 mg/dL).   Medical History: Past Medical History:  Diagnosis Date  . Arthritis    legs, back. neck  . Breast cancer ([place])   . Breast cancer of lower-outer quadrant of left female breast ([place]) 12/11/2015  . Cancer ([place])    ovarian cancer (hysterectomy-12-14 years ago) no txs  . Complication of anesthesia   . Constipation   . Diminished eyesight change in eyesight   cataracts  . Family history of breast cancer   . Family history of colon cancer   . Family history of melanoma   . Family history of prostate cancer   . GERD (gastroesophageal reflux disease)   . 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 Hyperlipidemia   . Hypertension   . Joint pain   . Leg pain   . Personal history of radiation therapy   . PONV (postoperative nausea and vomiting)   . Reflux   . Shortness of breath dyspnea    increased walking or climbing stairs  . Varicose veins   . Weight gain     Medications:  Scheduled:  . anastrozole  1 mg Oral Daily  . aspirin EC  81 mg Oral Daily  . atorvastatin  80 mg Oral Daily  . cholecalciferol  1,000 Units Oral Daily  . LORazepam  1 mg Oral Daily  . metoprolol tartrate  25  mg Oral BID  . multivitamin with minerals  1 tablet Oral Daily  . pantoprazole  40 mg Oral Daily    Assessment: Patient is a 51 yof that is being admitted for chest pain. The patient was not found to have an initial elevated trop however cardiology is concerned for ACS at this time and has asked pharmacy to dose heparin.   F/u heparin level subtherapeutic at 0.12. No problems with heparin gtt running or s/sx bleeding per RN. CBC is stable.    Goal of Therapy:  Heparin level 0.3-0.7 units/ml Monitor platelets by anticoagulation protocol: Yes   Plan:  - Heparin bolus 2,000 units IV x 1 dose - Heparin drip @ 1,050 units/hr - Heparin level in ~ 8 hours  - Monitor patient for s/sx of bleeding and CBC while on heparin  - Follow plans for heart cath   Mercy Riding, PharmD PGY1 Acute Care Pharmacy Resident Please refer to [facility] for unit-specific pharmacist

## 2020-09-21 NOTE — H&P (View-Only) (Signed)
Progress Note  Patient Name: Katrina Young Date of Encounter: 09/21/2020  [facility] Cardiologist: New  Subjective   Chest tightness has resolved  Inpatient Medications    Scheduled Meds: . anastrozole  1 mg Oral Daily  . aspirin EC  81 mg Oral Daily  . atorvastatin  80 mg Oral Daily  . cholecalciferol  1,000 Units Oral Daily  . LORazepam  1 mg Oral Daily  . metoprolol tartrate  25 mg Oral BID  . multivitamin with minerals  1 tablet Oral Daily  . pantoprazole  40 mg Oral Daily   Continuous Infusions: . sodium chloride 30 mL/hr at 09/20/20 2209  . heparin 1,050 Units/hr (09/21/20 1117)  . nitroGLYCERIN 15 mcg/min (09/20/20 2218)   PRN Meds: acetaminophen   Vital Signs    Vitals:   09/20/20 2251 09/20/20 2327 09/21/20 0429 09/21/20 0906  BP: (!) 161/88 (!) 160/86 (!) 155/72 (!) 169/71  Pulse: 66 68 64 64  Resp: 17 20 17 18   Temp:  98.3 F (36.8 C) 98 F (36.7 C) 97.8 F (36.6 C)  TempSrc:  Oral Oral Oral  SpO2: 93% 94% 94% 96%  Weight:  75.9 kg 75.9 kg   Height:        Intake/Output Summary (Last 24 hours) at 09/21/2020 1141 Last data filed at 09/21/2020 1000 Gross per 24 hour  Intake 492.6 ml  Output 1075 ml  Net -582.4 ml   Last 3 Weights 09/21/2020 09/20/2020 09/20/2020  Weight (lbs) 167 lb 6.4 oz 167 lb 6.4 oz 176 lb 2.4 oz  Weight (kg) 75.932 kg 75.932 kg 79.9 kg      Telemetry    NSR - Personally Reviewed  ECG    n/a - Personally Reviewed  Physical Exam   GEN: No acute distress.   Neck: No JVD Cardiac: RRR, no murmurs, rubs, or gallops.  Respiratory: Clear to auscultation bilaterally. GI: Soft, nontender, non-distended  MS: No edema; No deformity. Neuro:  Nonfocal  Psych: Normal affect   Labs    High Sensitivity Troponin:   Recent Labs  Lab 09/20/20 1905 09/20/20 2050 09/21/20 0049  TROPONINIHS 13 27* 44*      Chemistry Recent Labs  Lab 09/20/20 1905 09/21/20 0049  NA 140 142  K 4.0 3.9  CL 105 107  CO2 23 25   GLUCOSE 132* 125*  BUN 17 15  CREATININE 0.89 0.81  CALCIUM 9.3 9.4  PROT 6.6  --   ALBUMIN 3.8  --   AST 28  --   ALT 28  --   ALKPHOS 116  --   BILITOT 0.7  --   GFRNONAA >60 >60  ANIONGAP 12 10     Hematology Recent Labs  Lab 09/20/20 1905 09/20/20 2050 09/21/20 0049  WBC 6.6 8.0 8.2  RBC 4.62 4.55 4.74  HGB 13.6 13.6 13.7  HCT 40.4 39.0 40.3  MCV 87.4 85.7 85.0  MCH 29.4 29.9 28.9  MCHC 33.7 34.9 34.0  RDW 12.2 12.3 12.2  PLT 203 160 213    BNP Recent Labs  Lab 09/20/20 1905  BNP 93.4     DDimer No results for input(s): DDIMER in the last 168 hours.   Radiology    CT Angio Chest PE W and/or Wo Contrast  Result Date: 09/20/2020 CLINICAL DATA:  High probability of pulmonary embolus. EXAM: CT ANGIOGRAPHY CHEST WITH CONTRAST TECHNIQUE: Multidetector CT imaging of the chest was performed using the standard protocol during bolus administration of intravenous contrast.  Multiplanar CT image reconstructions and MIPs were obtained to evaluate the vascular anatomy. CONTRAST:  150mL OMNIPAQUE IOHEXOL 350 MG/ML SOLN COMPARISON:  August 20, 2014. FINDINGS: Cardiovascular: Satisfactory opacification of the pulmonary arteries to the segmental level. No evidence of pulmonary embolism. Mild cardiomegaly is noted. No pericardial effusion. Atherosclerosis of thoracic aorta is noted without aneurysm formation. Mediastinum/Nodes: No enlarged mediastinal, hilar, or axillary lymph nodes. Thyroid gland, trachea, and esophagus demonstrate no significant findings. Lungs/Pleura: No pneumothorax or pleural effusion is noted. Mosaic pattern is seen throughout both upper lobes concerning for air trapping secondary to small airways disease or possibly minimal multifocal inflammation. Minimal bibasilar subsegmental atelectasis is noted. Upper Abdomen: No acute abnormality. Musculoskeletal: No chest wall abnormality. No acute or significant osseous findings. Review of the MIP images confirms the  above findings. IMPRESSION: 1. No definite evidence of pulmonary embolus. 2. Mosaic pattern is seen throughout both upper lobes concerning for air trapping secondary to small airways disease or possibly minimal multifocal inflammation. 3. Minimal bibasilar subsegmental atelectasis is noted. 4. Aortic atherosclerosis. Aortic Atherosclerosis (ICD10-I70.0). Electronically Signed   By: Marijo Conception M.D.   On: 09/20/2020 20:46   DG Chest Portable 1 View  Result Date: 09/20/2020 CLINICAL DATA:  Chest pain EXAM: PORTABLE CHEST 1 VIEW COMPARISON:  July 15, 2014 FINDINGS: The heart size and mediastinal contours are within normal limits. Aortic knob calcifications are seen. There is prominence of the central pulmonary vasculature. Surgical clips are seen overlying the left lower lung. The visualized skeletal structures are unremarkable. IMPRESSION: Mild pulmonary vascular congestion. Electronically Signed   By: Prudencio Pair M.D.   On: 09/20/2020 19:13   ECHOCARDIOGRAM COMPLETE  Result Date: 09/21/2020    ECHOCARDIOGRAM REPORT   Patient Name:   HANALEI GLACE [facility] Date of Exam: 09/21/2020 Medical Rec #:  [id number]    Height:       63.0 in Accession #:    [id number]   Weight:       167.4 lb Date of Birth:  [birth year]    BSA:          1.793 m Patient Age:    80 years     BP:           155/72 mmHg Patient Gender: F            HR:           64 bpm. Exam Location:  Inpatient Procedure: 2D Echo Indications:    chest pain  History:        Patient has no prior history of Echocardiogram examinations.                 NSTEMI, Arrythmias:LBBB; Risk Factors:Hypertension.  Sonographer:    Johny Chess Referring Phys: [id number] [place]  1. Akinesis of the distal septum with overall low normal LV function.  2. Left ventricular ejection fraction, by estimation, is 50 to 55%. The left ventricle has low normal function. The left ventricle demonstrates regional wall motion abnormalities (see scoring diagram/findings  for description). Left ventricular diastolic  parameters are consistent with Grade I diastolic dysfunction (impaired relaxation).  3. Right ventricular systolic function is normal. The right ventricular size is normal. There is moderately elevated pulmonary artery systolic pressure.  4. Left atrial size was mildly dilated.  5. The mitral valve is normal in structure. Mild mitral valve regurgitation. No evidence of mitral stenosis.  6. The aortic valve is tricuspid. Aortic valve regurgitation is not visualized. No aortic  stenosis is present.  7. The inferior vena cava is normal in size with greater than 50% respiratory variability, suggesting right atrial pressure of 3 mmHg. FINDINGS  Left Ventricle: Left ventricular ejection fraction, by estimation, is 50 to 55%. The left ventricle has low normal function. The left ventricle demonstrates regional wall motion abnormalities. The left ventricular internal cavity size was normal in size. There is no left ventricular hypertrophy. Abnormal (paradoxical) septal motion, consistent with left bundle Berlyn Saylor block. Left ventricular diastolic parameters are consistent with Grade I diastolic dysfunction (impaired relaxation). Right Ventricle: The right ventricular size is normal.Right ventricular systolic function is normal. There is moderately elevated pulmonary artery systolic pressure. The tricuspid regurgitant velocity is 3.46 m/s, and with an assumed right atrial pressure of 3 mmHg, the estimated right ventricular systolic pressure is 87.8 mmHg. Left Atrium: Left atrial size was mildly dilated. Right Atrium: Right atrial size was normal in size. Pericardium: There is no evidence of pericardial effusion. Mitral Valve: The mitral valve is normal in structure. Mild mitral valve regurgitation. No evidence of mitral valve stenosis. Tricuspid Valve: The tricuspid valve is normal in structure. Tricuspid valve regurgitation is mild . No evidence of tricuspid stenosis. Aortic Valve:  The aortic valve is tricuspid. Aortic valve regurgitation is not visualized. No aortic stenosis is present. Pulmonic Valve: The pulmonic valve was not well visualized. Pulmonic valve regurgitation is not visualized. No evidence of pulmonic stenosis. Aorta: The aortic root is normal in size and structure. Venous: The inferior vena cava is normal in size with greater than 50% respiratory variability, suggesting right atrial pressure of 3 mmHg. IAS/Shunts: No atrial level shunt detected by color flow Doppler. Additional Comments: Akinesis of the distal septum with overall low normal LV function.  LEFT VENTRICLE PLAX 2D LVIDd:         4.70 cm      Diastology LVIDs:         3.00 cm      LV e' medial:    4.03 cm/s LV PW:         1.00 cm      LV E/e' medial:  28.5 LV IVS:        1.10 cm      LV e' lateral:   4.68 cm/s                             LV E/e' lateral: 24.6  LV Volumes (MOD) LV vol d, MOD A2C: 118.0 ml LV vol d, MOD A4C: 110.0 ml LV vol s, MOD A2C: 58.2 ml LV vol s, MOD A4C: 58.7 ml LV SV MOD A2C:     59.8 ml LV SV MOD A4C:     110.0 ml LV SV MOD BP:      58.1 ml RIGHT VENTRICLE             IVC RV S prime:     10.70 cm/s  IVC diam: 1.70 cm TAPSE (M-mode): 1.9 cm LEFT ATRIUM             Index       RIGHT ATRIUM           Index LA diam:        4.00 cm 2.23 cm/m  RA Area:     11.50 cm LA Vol (A2C):   64.9 ml 36.20 ml/m RA Volume:   25.00 ml  13.94 ml/m LA Vol (A4C):   53.1 ml 29.62  ml/m LA Biplane Vol: 59.1 ml 32.97 ml/m  AORTIC VALVE LVOT Vmax:   89.50 cm/s LVOT Vmean:  57.300 cm/s LVOT VTI:    0.207 m  AORTA Ao Asc diam: 3.10 cm MITRAL VALVE                TRICUSPID VALVE MV Area (PHT): 3.31 cm     TR Peak grad:   47.9 mmHg MV Decel Time: 229 msec     TR Vmax:        346.00 cm/s MR Peak grad: 103.6 mmHg MR Mean grad: 65.0 mmHg     SHUNTS MR Vmax:      509.00 cm/s   Systemic VTI: 0.21 m MR Vmean:     383.0 cm/s MV E velocity: 115.00 cm/s MV A velocity: 140.00 cm/s MV E/A ratio:  0.82 Kirk Ruths MD  Electronically signed by Kirk Ruths MD Signature Date/Time: 09/21/2020/10:43:40 AM    Final     Cardiac Studies     Patient Profile     Ms Philipp is a 80 y/o female with PMH HTN, HLD, H/o breast cancer s/p lumpectomy 2017 and radiation,now in remission, Obesity presented to the ER with c/o chest pain  Assessment & Plan    1. Chest pain/Unstable angina - admitted with exertional chest pain suggestive of unstable angina. DId present with SBP at 200 - normal trop on presentation, mid uptrend to 44. EKG chronic LBBB. Continue to cycle enzymes - echo LVE 50-55%, grade I dd. Akinesis distal septum, mod pulm HTN  - medical therapy with ASA 81, atorva 80, hep gtt, NG gtt,  lopressor 25mg  bid. Ongoing HTN, start losartan 25mg  daily  - chest pain free on NG gtt, plan for cath tomorrow - LDL is 73  2. HTN - SBPs in the 200s on presentation - remains elevated 160s - start losartan 25mg  daily in setting of likely ACS  I have reviewed the risks, indications, and alternatives to cardiac catheterization, possible angioplasty, and stenting with the patient and her husband today. Risks include but are not limited to bleeding, infection, vascular injury, stroke, myocardial infection, arrhythmia, kidney injury, radiation-related injury in the case of prolonged fluoroscopy use, emergency cardiac surgery, and death. The patient understands the risks of serious complication is 1-2 in 0102 with diagnostic cardiac cath and 1-2% or less with angioplasty/stenting.   For questions or updates, please contact [place] Please consult [email or web address] for contact info under        Signed, Carlyle Dolly, MD  09/21/2020, 11:41 AM

## 2020-09-21 NOTE — Progress Notes (Signed)
Patient is headed down to ECHO will complete EKG when she returns.

## 2020-09-21 NOTE — Progress Notes (Signed)
Progress Note  Patient Name: Katrina Young Date of Encounter: 09/21/2020  [facility] Cardiologist: New  Subjective   Chest tightness has resolved  Inpatient Medications    Scheduled Meds: . anastrozole  1 mg Oral Daily  . aspirin EC  81 mg Oral Daily  . atorvastatin  80 mg Oral Daily  . cholecalciferol  1,000 Units Oral Daily  . LORazepam  1 mg Oral Daily  . metoprolol tartrate  25 mg Oral BID  . multivitamin with minerals  1 tablet Oral Daily  . pantoprazole  40 mg Oral Daily   Continuous Infusions: . sodium chloride 30 mL/hr at 09/20/20 2209  . heparin 1,050 Units/hr (09/21/20 1117)  . nitroGLYCERIN 15 mcg/min (09/20/20 2218)   PRN Meds: acetaminophen   Vital Signs    Vitals:   09/20/20 2251 09/20/20 2327 09/21/20 0429 09/21/20 0906  BP: (!) 161/88 (!) 160/86 (!) 155/72 (!) 169/71  Pulse: 66 68 64 64  Resp: 17 20 17 18   Temp:  98.3 F (36.8 C) 98 F (36.7 C) 97.8 F (36.6 C)  TempSrc:  Oral Oral Oral  SpO2: 93% 94% 94% 96%  Weight:  75.9 kg 75.9 kg   Height:        Intake/Output Summary (Last 24 hours) at 09/21/2020 1141 Last data filed at 09/21/2020 1000 Gross per 24 hour  Intake 492.6 ml  Output 1075 ml  Net -582.4 ml   Last 3 Weights 09/21/2020 09/20/2020 09/20/2020  Weight (lbs) 167 lb 6.4 oz 167 lb 6.4 oz 176 lb 2.4 oz  Weight (kg) 75.932 kg 75.932 kg 79.9 kg      Telemetry    NSR - Personally Reviewed  ECG    n/a - Personally Reviewed  Physical Exam   GEN: No acute distress.   Neck: No JVD Cardiac: RRR, no murmurs, rubs, or gallops.  Respiratory: Clear to auscultation bilaterally. GI: Soft, nontender, non-distended  MS: No edema; No deformity. Neuro:  Nonfocal  Psych: Normal affect   Labs    High Sensitivity Troponin:   Recent Labs  Lab 09/20/20 1905 09/20/20 2050 09/21/20 0049  TROPONINIHS 13 27* 44*      Chemistry Recent Labs  Lab 09/20/20 1905 09/21/20 0049  NA 140 142  K 4.0 3.9  CL 105 107  CO2 23 25   GLUCOSE 132* 125*  BUN 17 15  CREATININE 0.89 0.81  CALCIUM 9.3 9.4  PROT 6.6  --   ALBUMIN 3.8  --   AST 28  --   ALT 28  --   ALKPHOS 116  --   BILITOT 0.7  --   GFRNONAA >60 >60  ANIONGAP 12 10     Hematology Recent Labs  Lab 09/20/20 1905 09/20/20 2050 09/21/20 0049  WBC 6.6 8.0 8.2  RBC 4.62 4.55 4.74  HGB 13.6 13.6 13.7  HCT 40.4 39.0 40.3  MCV 87.4 85.7 85.0  MCH 29.4 29.9 28.9  MCHC 33.7 34.9 34.0  RDW 12.2 12.3 12.2  PLT 203 160 213    BNP Recent Labs  Lab 09/20/20 1905  BNP 93.4     DDimer No results for input(s): DDIMER in the last 168 hours.   Radiology    CT Angio Chest PE W and/or Wo Contrast  Result Date: 09/20/2020 CLINICAL DATA:  High probability of pulmonary embolus. EXAM: CT ANGIOGRAPHY CHEST WITH CONTRAST TECHNIQUE: Multidetector CT imaging of the chest was performed using the standard protocol during bolus administration of intravenous contrast.  Multiplanar CT image reconstructions and MIPs were obtained to evaluate the vascular anatomy. CONTRAST:  181mL OMNIPAQUE IOHEXOL 350 MG/ML SOLN COMPARISON:  August 20, 2014. FINDINGS: Cardiovascular: Satisfactory opacification of the pulmonary arteries to the segmental level. No evidence of pulmonary embolism. Mild cardiomegaly is noted. No pericardial effusion. Atherosclerosis of thoracic aorta is noted without aneurysm formation. Mediastinum/Nodes: No enlarged mediastinal, hilar, or axillary lymph nodes. Thyroid gland, trachea, and esophagus demonstrate no significant findings. Lungs/Pleura: No pneumothorax or pleural effusion is noted. Mosaic pattern is seen throughout both upper lobes concerning for air trapping secondary to small airways disease or possibly minimal multifocal inflammation. Minimal bibasilar subsegmental atelectasis is noted. Upper Abdomen: No acute abnormality. Musculoskeletal: No chest wall abnormality. No acute or significant osseous findings. Review of the MIP images confirms the  above findings. IMPRESSION: 1. No definite evidence of pulmonary embolus. 2. Mosaic pattern is seen throughout both upper lobes concerning for air trapping secondary to small airways disease or possibly minimal multifocal inflammation. 3. Minimal bibasilar subsegmental atelectasis is noted. 4. Aortic atherosclerosis. Aortic Atherosclerosis (ICD10-I70.0). Electronically Signed   By: Marijo Conception M.D.   On: 09/20/2020 20:46   DG Chest Portable 1 View  Result Date: 09/20/2020 CLINICAL DATA:  Chest pain EXAM: PORTABLE CHEST 1 VIEW COMPARISON:  July 15, 2014 FINDINGS: The heart size and mediastinal contours are within normal limits. Aortic knob calcifications are seen. There is prominence of the central pulmonary vasculature. Surgical clips are seen overlying the left lower lung. The visualized skeletal structures are unremarkable. IMPRESSION: Mild pulmonary vascular congestion. Electronically Signed   By: Prudencio Pair M.D.   On: 09/20/2020 19:13   ECHOCARDIOGRAM COMPLETE  Result Date: 09/21/2020    ECHOCARDIOGRAM REPORT   Patient Name:   Katrina Young [facility] Date of Exam: 09/21/2020 Medical Rec #:  [id number]    Height:       63.0 in Accession #:    [id number]   Weight:       167.4 lb Date of Birth:  19-Jan-1941    BSA:          1.793 m Patient Age:    80 years     BP:           155/72 mmHg Patient Gender: F            HR:           64 bpm. Exam Location:  Inpatient Procedure: 2D Echo Indications:    chest pain  History:        Patient has no prior history of Echocardiogram examinations.                 NSTEMI, Arrythmias:LBBB; Risk Factors:Hypertension.  Sonographer:    Johny Chess Referring Phys: [id number] [place]  1. Akinesis of the distal septum with overall low normal LV function.  2. Left ventricular ejection fraction, by estimation, is 50 to 55%. The left ventricle has low normal function. The left ventricle demonstrates regional wall motion abnormalities (see scoring diagram/findings  for description). Left ventricular diastolic  parameters are consistent with Grade I diastolic dysfunction (impaired relaxation).  3. Right ventricular systolic function is normal. The right ventricular size is normal. There is moderately elevated pulmonary artery systolic pressure.  4. Left atrial size was mildly dilated.  5. The mitral valve is normal in structure. Mild mitral valve regurgitation. No evidence of mitral stenosis.  6. The aortic valve is tricuspid. Aortic valve regurgitation is not visualized. No aortic  stenosis is present.  7. The inferior vena cava is normal in size with greater than 50% respiratory variability, suggesting right atrial pressure of 3 mmHg. FINDINGS  Left Ventricle: Left ventricular ejection fraction, by estimation, is 50 to 55%. The left ventricle has low normal function. The left ventricle demonstrates regional wall motion abnormalities. The left ventricular internal cavity size was normal in size. There is no left ventricular hypertrophy. Abnormal (paradoxical) septal motion, consistent with left bundle Izzy Cour[place] block. Left ventricular diastolic parameters are consistent with Grade I diastolic dysfunction (impaired relaxation). Right Ventricle: The right ventricular size is normal.Right ventricular systolic function is normal. There is moderately elevated pulmonary artery systolic pressure. The tricuspid regurgitant velocity is 3.46 m/s, and with an assumed right atrial pressure of 3 mmHg, the estimated right ventricular systolic pressure is 02.7 mmHg. Left Atrium: Left atrial size was mildly dilated. Right Atrium: Right atrial size was normal in size. Pericardium: There is no evidence of pericardial effusion. Mitral Valve: The mitral valve is normal in structure. Mild mitral valve regurgitation. No evidence of mitral valve stenosis. Tricuspid Valve: The tricuspid valve is normal in structure. Tricuspid valve regurgitation is mild . No evidence of tricuspid stenosis. Aortic Valve:  The aortic valve is tricuspid. Aortic valve regurgitation is not visualized. No aortic stenosis is present. Pulmonic Valve: The pulmonic valve was not well visualized. Pulmonic valve regurgitation is not visualized. No evidence of pulmonic stenosis. Aorta: The aortic root is normal in size and structure. Venous: The inferior vena cava is normal in size with greater than 50% respiratory variability, suggesting right atrial pressure of 3 mmHg. IAS/Shunts: No atrial level shunt detected by color flow Doppler. Additional Comments: Akinesis of the distal septum with overall low normal LV function.  LEFT VENTRICLE PLAX 2D LVIDd:         4.70 cm      Diastology LVIDs:         3.00 cm      LV e' medial:    4.03 cm/s LV PW:         1.00 cm      LV E/e' medial:  28.5 LV IVS:        1.10 cm      LV e' lateral:   4.68 cm/s                             LV E/e' lateral: 24.6  LV Volumes (MOD) LV vol d, MOD A2C: 118.0 ml LV vol d, MOD A4C: 110.0 ml LV vol s, MOD A2C: 58.2 ml LV vol s, MOD A4C: 58.7 ml LV SV MOD A2C:     59.8 ml LV SV MOD A4C:     110.0 ml LV SV MOD BP:      58.1 ml RIGHT VENTRICLE             IVC RV S prime:     10.70 cm/s  IVC diam: 1.70 cm TAPSE (M-mode): 1.9 cm LEFT ATRIUM             Index       RIGHT ATRIUM           Index LA diam:        4.00 cm 2.23 cm/m  RA Area:     11.50 cm LA Vol (A2C):   64.9 ml 36.20 ml/m RA Volume:   25.00 ml  13.94 ml/m LA Vol (A4C):   53.1 ml 29.62  ml/m LA Biplane Vol: 59.1 ml 32.97 ml/m  AORTIC VALVE LVOT Vmax:   89.50 cm/s LVOT Vmean:  57.300 cm/s LVOT VTI:    0.207 m  AORTA Ao Asc diam: 3.10 cm MITRAL VALVE                TRICUSPID VALVE MV Area (PHT): 3.31 cm     TR Peak grad:   47.9 mmHg MV Decel Time: 229 msec     TR Vmax:        346.00 cm/s MR Peak grad: 103.6 mmHg MR Mean grad: 65.0 mmHg     SHUNTS MR Vmax:      509.00 cm/s   Systemic VTI: 0.21 m MR Vmean:     383.0 cm/s MV E velocity: 115.00 cm/s MV A velocity: 140.00 cm/s MV E/A ratio:  0.82 Kirk Ruths MD  Electronically signed by Kirk Ruths MD Signature Date/Time: 09/21/2020/10:43:40 AM    Final     Cardiac Studies     Patient Profile     Ms Fiser is a 80 y/o female with PMH HTN, HLD, H/o breast cancer s/p lumpectomy 2017 and radiation,now in remission, Obesity presented to the ER with c/o chest pain  Assessment & Plan    1. Chest pain/Unstable angina - admitted with exertional chest pain suggestive of unstable angina. DId present with SBP at 200 - normal trop on presentation, mid uptrend to 44. EKG chronic LBBB. Continue to cycle enzymes - echo LVE 50-55%, grade I dd. Akinesis distal septum, mod pulm HTN  - medical therapy with ASA 81, atorva 80, hep gtt, NG gtt,  lopressor 25mg  bid. Ongoing HTN, start losartan 25mg  daily  - chest pain free on NG gtt, plan for cath tomorrow - LDL is 73  2. HTN - SBPs in the 200s on presentation - remains elevated 160s - start losartan 25mg  daily in setting of likely ACS  I have reviewed the risks, indications, and alternatives to cardiac catheterization, possible angioplasty, and stenting with the patient and her husband today. Risks include but are not limited to bleeding, infection, vascular injury, stroke, myocardial infection, arrhythmia, kidney injury, radiation-related injury in the case of prolonged fluoroscopy use, emergency cardiac surgery, and death. The patient understands the risks of serious complication is 1-2 in 9381 with diagnostic cardiac cath and 1-2% or less with angioplasty/stenting.   For questions or updates, please contact [place] Please consult [email or web address] for contact info under        Signed, Carlyle Dolly, MD  09/21/2020, 11:41 AM

## 2020-09-21 NOTE — Progress Notes (Signed)
[facility] for Heparin Indication: chest pain/ACS  Allergies  Allergen Reactions  . Codeine Nausea Only  . Morphine And Related Nausea Only    Patient Measurements: Height: 5\' 3"  (160 cm) Weight: 75.9 kg (167 lb 6.4 oz) IBW/kg (Calculated) : 52.4 Heparin Dosing Weight: 69.8 kg  Vital Signs: Temp: 97.6 F (36.4 C) (02/27 1632) Temp Source: Oral (02/27 1632) BP: 158/66 (02/27 1632) Pulse Rate: 71 (02/27 1632)  Labs: Recent Labs    09/20/20 1905 09/20/20 2050 09/21/20 0049 09/21/20 0854 09/21/20 1205 09/21/20 1347 09/21/20 1911  HGB 13.6 13.6 13.7  --   --   --   --   HCT 40.4 39.0 40.3  --   --   --   --   PLT 203 160 213  --   --   --   --   HEPARINUNFRC  --   --   --  0.12*  --   --  0.17*  CREATININE 0.89  --  0.81  --   --   --   --   TROPONINIHS 13 27* 44*  --  22* 23*  --     Estimated Creatinine Clearance: 54.9 mL/min (by C-G formula based on SCr of 0.81 mg/dL).  Assessment: Patient is a 26 yof that is being admitted for chest pain. The patient was not found to have an initial elevated trop however cardiology is concerned for ACS at this time and has asked pharmacy to dose heparin.   Heparin level remains subtherapeutic (0.17) on gtt at 1050 units/hr. No issues with line or bleeding reported per RN. Noted plan for cath tomorrow.   Goal of Therapy:  Heparin level 0.3-0.7 units/ml Monitor platelets by anticoagulation protocol: Yes   Plan:  Rebolus heparin 2000 units Increase heparin gtt to 1250 units/hr Heparin level in 8 hours  Sherlon Handing, PharmD, BCPS Please see amion for complete clinical pharmacist phone list 09/21/2020 8:17 PM

## 2020-09-21 NOTE — Progress Notes (Signed)
  Echocardiogram 2D Echocardiogram has been performed.  Katrina Young 09/21/2020, 8:23 AM

## 2020-09-21 NOTE — Progress Notes (Signed)
Patient ID: Katrina Young, female   DOB: [birth year], 80 y.o.   MRN: [id number]  PROGRESS NOTE    Katrina Young  WNU:[id number] DOB: 1940-10-26 DOA: 09/20/2020 PCP: Leanna Battles, MD   Brief Narrative:  80 y.o. female with medical history significant for breast and ovarian cancer on anastrozole, chronic anxiety, essential hypertension, hyperlipidemia presented with chest pain.  On resignation, EKG showed nonspecific ST-T wave changes.  She was started on nitroglycerin drip and cardiology was consulted.  Assessment & Plan:   Chest pain/unstable angina -Presented with chest pain with nonspecific ST-T wave changes.  Troponins did not trend up significantly.  Currently denies any chest pain -Cardiology following and patient is currently on heparin and nitroglycerin drips.  Continue aspirin, statin and metoprolol.  Currently n.p.o. for possible cardiac cath today versus tomorrow.  Follow further cardiology recommendations  Essential hypertension -Blood pressure elevated.  Continue nitroglycerin drip.  Continue metoprolol  Hyperlipidemia--continue statin  Chronic anxiety -Continue home Ativan  GERD -Continue PPI  History of breast cancer and ovarian cancer -Continue anastrozole.  Outpatient follow-up with oncology  DVT prophylaxis: Heparin Code Status: Full Family Communication: None Disposition Plan: Status is: Observation  The patient will require care spanning > 2 midnights and should be moved to inpatient because: Inpatient level of care appropriate due to severity of illness  Dispo:  Patient From: Home  Planned Disposition: Home.  Discharge in 1 to 2 days pending clinical improvement and clearance by cardiology  Medically stable for discharge: No   Consultants: Cardiology  Procedures: Echo pending  Antimicrobials: None   Subjective: Patient seen and examined at bedside.  Denies any current chest pain.  Denies any shortness of breath, nausea or  vomiting.  Objective: Vitals:   09/20/20 2251 09/20/20 2327 09/21/20 0429 09/21/20 0906  BP: (!) 161/88 (!) 160/86 (!) 155/72 (!) 169/71  Pulse: 66 68 64 64  Resp: 17 20 17 18   Temp:  98.3 F (36.8 C) 98 F (36.7 C) 97.8 F (36.6 C)  TempSrc:  Oral Oral Oral  SpO2: 93% 94% 94% 96%  Weight:  75.9 kg 75.9 kg   Height:        Intake/Output Summary (Last 24 hours) at 09/21/2020 1009 Last data filed at 09/21/2020 0738 Gross per 24 hour  Intake 252.6 ml  Output 625 ml  Net -372.4 ml   Filed Weights   09/20/20 2100 09/20/20 2327 09/21/20 0429  Weight: 79.9 kg 75.9 kg 75.9 kg    Examination:  General exam: Appears calm and comfortable.  Currently on room air Respiratory system: Bilateral decreased breath sounds at bases Cardiovascular system: S1 & S2 heard, Rate controlled Gastrointestinal system: Abdomen is nondistended, soft and nontender. Normal bowel sounds heard. Extremities: No cyanosis, clubbing, edema  Central nervous system: Alert and oriented. No focal neurological deficits. Moving extremities Skin: No rashes, lesions or ulcers Psychiatry: Judgement and insight appear normal. Mood & affect appropriate.     Data Reviewed: I have personally reviewed following labs and imaging studies  CBC: Recent Labs  Lab 09/20/20 1905 09/20/20 2050 09/21/20 0049  WBC 6.6 8.0 8.2  HGB 13.6 13.6 13.7  HCT 40.4 39.0 40.3  MCV 87.4 85.7 85.0  PLT 203 160 034   Basic Metabolic Panel: Recent Labs  Lab 09/20/20 1905 09/21/20 0049  NA 140 142  K 4.0 3.9  CL 105 107  CO2 23 25  GLUCOSE 132* 125*  BUN 17 15  CREATININE 0.89 0.81  CALCIUM 9.3 9.4  GFR: Estimated Creatinine Clearance: 54.9 mL/min (by C-G formula based on SCr of 0.81 mg/dL). Liver Function Tests: Recent Labs  Lab 09/20/20 1905  AST 28  ALT 28  ALKPHOS 116  BILITOT 0.7  PROT 6.6  ALBUMIN 3.8   No results for input(s): LIPASE, AMYLASE in the last 168 hours. No results for input(s): AMMONIA in the  last 168 hours. Coagulation Profile: No results for input(s): INR, PROTIME in the last 168 hours. Cardiac Enzymes: No results for input(s): CKTOTAL, CKMB, CKMBINDEX, TROPONINI in the last 168 hours. BNP (last 3 results) No results for input(s): PROBNP in the last 8760 hours. HbA1C: No results for input(s): HGBA1C in the last 72 hours. CBG: Recent Labs  Lab 09/20/20 1922  GLUCAP 108*   Lipid Profile: Recent Labs    09/21/20 0049  CHOL 158  HDL 55  LDLCALC 73  TRIG 149  CHOLHDL 2.9   Thyroid Function Tests: No results for input(s): TSH, T4TOTAL, FREET4, T3FREE, THYROIDAB in the last 72 hours. Anemia Panel: No results for input(s): VITAMINB12, FOLATE, FERRITIN, TIBC, IRON, RETICCTPCT in the last 72 hours. Sepsis Labs: No results for input(s): PROCALCITON, LATICACIDVEN in the last 168 hours.  Recent Results (from the past 240 hour(s))  SARS CORONAVIRUS 2 (TAT 6-24 HRS) Nasopharyngeal Nasopharyngeal Swab     Status: None   Collection Time: 09/20/20  8:51 PM   Specimen: Nasopharyngeal Swab  Result Value Ref Range Status   SARS Coronavirus 2 NEGATIVE NEGATIVE Final    Comment: (NOTE) SARS-CoV-2 target nucleic acids are NOT DETECTED.  The SARS-CoV-2 RNA is generally detectable in upper and lower respiratory specimens during the acute phase of infection. Negative results do not preclude SARS-CoV-2 infection, do not rule out co-infections with other pathogens, and should not be used as the sole basis for treatment or other patient management decisions. Negative results must be combined with clinical observations, patient history, and epidemiological information. The expected result is Negative.  Fact Sheet for Patients: SugarRoll.be  Fact Sheet for Healthcare Providers: https://www.woods-mathews.com/  This test is not yet approved or cleared by the [place] FDA and  has been authorized for detection and/or diagnosis of  SARS-CoV-2 by FDA under an Emergency Use Authorization (EUA). This EUA will remain  in effect (meaning this test can be used) for the duration of the COVID-19 declaration under Se ction 564(b)(1) of the Act, 21 U.S.C. section 360bbb-3(b)(1), unless the authorization is terminated or revoked sooner.  Performed at [facility], [place] [address] [place], [place] [address]          Radiology Studies: CT Angio Chest PE W and/or Wo Contrast  Result Date: 09/20/2020 CLINICAL DATA:  High probability of pulmonary embolus. EXAM: CT ANGIOGRAPHY CHEST WITH CONTRAST TECHNIQUE: Multidetector CT imaging of the chest was performed using the standard protocol during bolus administration of intravenous contrast. Multiplanar CT image reconstructions and MIPs were obtained to evaluate the vascular anatomy. CONTRAST:  132mL OMNIPAQUE IOHEXOL 350 MG/ML SOLN COMPARISON:  August 20, 2014. FINDINGS: Cardiovascular: Satisfactory opacification of the pulmonary arteries to the segmental level. No evidence of pulmonary embolism. Mild cardiomegaly is noted. No pericardial effusion. Atherosclerosis of thoracic aorta is noted without aneurysm formation. Mediastinum/Nodes: No enlarged mediastinal, hilar, or axillary lymph nodes. Thyroid gland, trachea, and esophagus demonstrate no significant findings. Lungs/Pleura: No pneumothorax or pleural effusion is noted. Mosaic pattern is seen throughout both upper lobes concerning for air trapping secondary to small airways disease or possibly minimal multifocal inflammation. Minimal bibasilar  subsegmental atelectasis is noted. Upper Abdomen: No acute abnormality. Musculoskeletal: No chest wall abnormality. No acute or significant osseous findings. Review of the MIP images confirms the above findings. IMPRESSION: 1. No definite evidence of pulmonary embolus. 2. Mosaic pattern is seen throughout both upper lobes concerning for air trapping secondary to small airways disease or  possibly minimal multifocal inflammation. 3. Minimal bibasilar subsegmental atelectasis is noted. 4. Aortic atherosclerosis. Aortic Atherosclerosis (ICD10-I70.0). Electronically Signed   By: Marijo Conception M.D.   On: 09/20/2020 20:46   DG Chest Portable 1 View  Result Date: 09/20/2020 CLINICAL DATA:  Chest pain EXAM: PORTABLE CHEST 1 VIEW COMPARISON:  July 15, 2014 FINDINGS: The heart size and mediastinal contours are within normal limits. Aortic knob calcifications are seen. There is prominence of the central pulmonary vasculature. Surgical clips are seen overlying the left lower lung. The visualized skeletal structures are unremarkable. IMPRESSION: Mild pulmonary vascular congestion. Electronically Signed   By: Prudencio Pair M.D.   On: 09/20/2020 19:13        Scheduled Meds: . anastrozole  1 mg Oral Daily  . aspirin EC  81 mg Oral Daily  . atorvastatin  80 mg Oral Daily  . cholecalciferol  1,000 Units Oral Daily  . LORazepam  1 mg Oral Daily  . metoprolol tartrate  25 mg Oral BID  . multivitamin with minerals  1 tablet Oral Daily  . pantoprazole  40 mg Oral Daily   Continuous Infusions: . sodium chloride 30 mL/hr at 09/20/20 2209  . heparin 850 Units/hr (09/21/20 0202)  . nitroGLYCERIN 15 mcg/min (09/20/20 2218)          Aline August, MD [facility] 09/21/2020, 10:09 AM

## 2020-09-22 ENCOUNTER — Encounter (HOSPITAL_COMMUNITY)
Admission: EM | Disposition: A | Discharge status: Home / Self Care | Payer: Self-pay | Source: Home / Self Care | Attending: Internal Medicine

## 2020-09-22 ENCOUNTER — Encounter (HOSPITAL_COMMUNITY): Payer: Self-pay | Admitting: Interventional Cardiology

## 2020-09-22 DIAGNOSIS — I447 Left bundle-branch block, unspecified: Secondary | ICD-10-CM

## 2020-09-22 DIAGNOSIS — R0602 Shortness of breath: Secondary | ICD-10-CM

## 2020-09-22 DIAGNOSIS — I214 Non-ST elevation (NSTEMI) myocardial infarction: Secondary | ICD-10-CM

## 2020-09-22 HISTORY — PX: LEFT HEART CATH AND CORONARY ANGIOGRAPHY: CATH118249

## 2020-09-22 LAB — COMPREHENSIVE METABOLIC PANEL
ALT: 22 U/L (ref 0–44)
AST: 20 U/L (ref 15–41)
Albumin: 3.4 g/dL — ABNORMAL LOW (ref 3.5–5.0)
Alkaline Phosphatase: 100 U/L (ref 38–126)
Anion gap: 10 (ref 5–15)
BUN: 15 mg/dL (ref 8–23)
CO2: 24 mmol/L (ref 22–32)
Calcium: 9.3 mg/dL (ref 8.9–10.3)
Chloride: 107 mmol/L (ref 98–111)
Creatinine, Ser: 0.81 mg/dL (ref 0.44–1.00)
GFR, Estimated: >60 mL/min (ref >60)
Glucose, Bld: 114 mg/dL — ABNORMAL HIGH (ref 70–99)
Potassium: 3.8 mmol/L (ref 3.5–5.1)
Sodium: 141 mmol/L (ref 135–145)
Total Bilirubin: 0.8 mg/dL (ref 0.3–1.2)
Total Protein: 5.9 g/dL — ABNORMAL LOW (ref 6.5–8.1)

## 2020-09-22 LAB — CBC
HCT: 34.8 % — ABNORMAL LOW (ref 36.0–46.0)
Hemoglobin: 12.3 g/dL (ref 12.0–15.0)
MCH: 30.1 pg (ref 26.0–34.0)
MCHC: 35.3 g/dL (ref 30.0–36.0)
MCV: 85.1 fL (ref 80.0–100.0)
Platelets: 176 10*3/uL (ref 150–400)
RBC: 4.09 MIL/uL (ref 3.87–5.11)
RDW: 12.4 % (ref 11.5–15.5)
WBC: 7.8 10*3/uL (ref 4.0–10.5)
nRBC: 0 % (ref 0.0–0.2)

## 2020-09-22 LAB — HEMOGLOBIN A1C
Hgb A1c MFr Bld: 6 % — ABNORMAL HIGH (ref 4.8–5.6)
Mean Plasma Glucose: 125.5 mg/dL

## 2020-09-22 LAB — HEPARIN LEVEL (UNFRACTIONATED): Heparin Unfractionated: 0.42 IU/mL (ref 0.30–0.70)

## 2020-09-22 LAB — MAGNESIUM: Magnesium: 2 mg/dL (ref 1.7–2.4)

## 2020-09-22 SURGERY — LEFT HEART CATH AND CORONARY ANGIOGRAPHY
Anesthesia: LOCAL

## 2020-09-22 MED ORDER — MIDAZOLAM HCL 2 MG/2ML IJ SOLN
INTRAMUSCULAR | Status: DC | PRN
  Administered 2020-09-22: 2 mg via INTRAVENOUS

## 2020-09-22 MED ORDER — HYDRALAZINE HCL 20 MG/ML IJ SOLN
INTRAMUSCULAR | Status: DC | PRN
  Administered 2020-09-22: 10 mg via INTRAVENOUS

## 2020-09-22 MED ORDER — FENTANYL CITRATE (PF) 100 MCG/2ML IJ SOLN
INTRAMUSCULAR | Status: AC
  Filled 2020-09-22: qty 2

## 2020-09-22 MED ORDER — HEPARIN (PORCINE) IN NACL 1000-0.9 UT/500ML-% IV SOLN
INTRAVENOUS | Status: AC
  Filled 2020-09-22: qty 1000

## 2020-09-22 MED ORDER — HYDRALAZINE HCL 20 MG/ML IJ SOLN: 10.0000 mg | INTRAMUSCULAR | Status: DC | PRN

## 2020-09-22 MED ORDER — HYDRALAZINE HCL 20 MG/ML IJ SOLN
INTRAMUSCULAR | Status: AC
  Filled 2020-09-22: qty 1

## 2020-09-22 MED ORDER — HEPARIN SODIUM (PORCINE) 1000 UNIT/ML IJ SOLN
INTRAMUSCULAR | Status: DC | PRN
  Administered 2020-09-22: 2000 [IU] via INTRAVENOUS

## 2020-09-22 MED ORDER — LOSARTAN POTASSIUM 25 MG PO TABS: 25.0000 mg | ORAL_TABLET | Freq: Every day | ORAL | 0 refills | Status: DC

## 2020-09-22 MED ORDER — HEPARIN (PORCINE) IN NACL 1000-0.9 UT/500ML-% IV SOLN
INTRAVENOUS | Status: DC | PRN
  Administered 2020-09-22 (×2): 500 mL

## 2020-09-22 MED ORDER — LIDOCAINE HCL (PF) 1 % IJ SOLN
INTRAMUSCULAR | Status: DC | PRN
  Administered 2020-09-22: 2 mL

## 2020-09-22 MED ORDER — ACETAMINOPHEN 325 MG PO TABS
650.0000 mg | ORAL_TABLET | ORAL | Status: DC | PRN
  Administered 2020-09-22: 650 mg via ORAL
  Filled 2020-09-22: qty 2

## 2020-09-22 MED ORDER — VERAPAMIL HCL 2.5 MG/ML IV SOLN
INTRAVENOUS | Status: DC | PRN
  Administered 2020-09-22: 10 mL via INTRA_ARTERIAL

## 2020-09-22 MED ORDER — MIDAZOLAM HCL 2 MG/2ML IJ SOLN
INTRAMUSCULAR | Status: AC
  Filled 2020-09-22: qty 2

## 2020-09-22 MED ORDER — IOHEXOL 350 MG/ML SOLN
INTRAVENOUS | Status: DC | PRN
  Administered 2020-09-22: 40 mL

## 2020-09-22 MED ORDER — SODIUM CHLORIDE 0.9 % IV SOLN: 250.0000 mL | INTRAVENOUS | Status: DC | PRN

## 2020-09-22 MED ORDER — LIDOCAINE HCL (PF) 1 % IJ SOLN
INTRAMUSCULAR | Status: AC
  Filled 2020-09-22: qty 30

## 2020-09-22 MED ORDER — HEPARIN SODIUM (PORCINE) 1000 UNIT/ML IJ SOLN
INTRAMUSCULAR | Status: AC
  Filled 2020-09-22: qty 1

## 2020-09-22 MED ORDER — FENTANYL CITRATE (PF) 100 MCG/2ML IJ SOLN
INTRAMUSCULAR | Status: DC | PRN
  Administered 2020-09-22: 25 ug via INTRAVENOUS

## 2020-09-22 MED ORDER — SODIUM CHLORIDE 0.9% FLUSH: 3.0000 mL | Freq: Two times a day (BID) | INTRAVENOUS | Status: DC

## 2020-09-22 MED ORDER — LABETALOL HCL 5 MG/ML IV SOLN
10.0000 mg | INTRAVENOUS | Status: DC | PRN
Start: 2020-09-22 — End: 2020-09-22

## 2020-09-22 MED ORDER — ATORVASTATIN CALCIUM 80 MG PO TABS: 80.0000 mg | ORAL_TABLET | Freq: Every day | ORAL | 0 refills | Status: DC

## 2020-09-22 MED ORDER — VERAPAMIL HCL 2.5 MG/ML IV SOLN
INTRAVENOUS | Status: AC
  Filled 2020-09-22: qty 2

## 2020-09-22 MED ORDER — SODIUM CHLORIDE 0.9% FLUSH: 3.0000 mL | INTRAVENOUS | Status: DC | PRN

## 2020-09-22 MED ORDER — ONDANSETRON HCL 4 MG/2ML IJ SOLN: 4.0000 mg | Freq: Four times a day (QID) | INTRAMUSCULAR | Status: DC | PRN

## 2020-09-22 SURGICAL SUPPLY — 11 items
CATH 5FR JL3.5 JR4 ANG PIG MP (CATHETERS) ×2 IMPLANT
DEVICE RAD COMP TR BAND LRG (VASCULAR PRODUCTS) ×2 IMPLANT
GLIDESHEATH SLEND SS 6F .021 (SHEATH) ×2 IMPLANT
GUIDEWIRE INQWIRE 1.5J.035X260 (WIRE) ×1 IMPLANT
INQWIRE 1.5J .035X260CM (WIRE) ×2
KIT HEART LEFT (KITS) ×2 IMPLANT
PACK CARDIAC CATHETERIZATION (CUSTOM PROCEDURE TRAY) ×2 IMPLANT
TRANSDUCER W/STOPCOCK (MISCELLANEOUS) ×2 IMPLANT
TUBING CIL FLEX 10 FLL-RA (TUBING) ×2 IMPLANT
WIRE ASAHI PROWATER 180CM (WIRE) ×2 IMPLANT
WIRE HI TORQ VERSACORE-J 145CM (WIRE) ×2 IMPLANT

## 2020-09-22 NOTE — Research (Signed)
IDENTIFY Informed Consent   Subject Name: Katrina Young  Subject met inclusion and exclusion criteria.  The informed consent form, study requirements and expectations were reviewed with the subject and questions and concerns were addressed prior to the signing of the consent form.  The subject verbalized understanding of the trail requirements.  The subject agreed to participate in the IDENTIFY trial and signed the informed consent.  The informed consent was obtained prior to performance of any protocol-specific procedures for the subject.  A copy of the signed informed consent was given to the subject and a copy was placed in the subject's medical record.  Philemon Kingdom D 09/22/2020, 0911 am

## 2020-09-22 NOTE — Progress Notes (Signed)
Patient ID: Katrina Young, female   DOB: [birth year], 80 y.o.   MRN: [id number]  PROGRESS NOTE    MARILEE DITOMMASO  HUD:[id number] DOB: [birth year] DOA: 09/20/2020 PCP: Leanna Battles, MD   Brief Narrative:  80 y.o. female with medical history significant for breast and ovarian cancer on anastrozole, chronic anxiety, essential hypertension, hyperlipidemia presented with chest pain.  On resignation, EKG showed nonspecific ST-T wave changes.  She was started on nitroglycerin drip and cardiology was consulted.  Assessment & Plan:   Chest pain/unstable angina -Presented with chest pain with nonspecific ST-T wave changes.  Troponins did not trend up significantly.  Currently denies any chest pain -Echo showed EF of 50 to 55% with grade 1 diastolic dysfunction.  Cardiology following and patient is currently on heparin and nitroglycerin drips.  Continue aspirin, statin and metoprolol. -Cardiology following: For possible cardiac cath today  Essential hypertension -Blood pressure elevated.  Continue nitroglycerin drip.  Continue metoprolol  Hyperlipidemia--continue statin  Chronic anxiety -Continue home Ativan  GERD -Continue PPI  History of breast cancer and ovarian cancer -Continue anastrozole.  Outpatient follow-up with oncology  DVT prophylaxis: Heparin Code Status: Full Family Communication: None Disposition Plan: Status is: Inpatient because: Inpatient level of care appropriate due to severity of illness  Dispo:  Patient From: Home  Planned Disposition: Home.  Discharge in 1 to 2 days pending clinical improvement and clearance by cardiology  Medically stable for discharge: No   Consultants: Cardiology  Procedures: Echo  Antimicrobials: None   Subjective: Patient seen and examined at bedside.  No overnight fever, vomiting, abdominal pain or worsening shortness of breath.  Denies any current chest pain. Objective: Vitals:   09/21/20 1632 09/22/20 0003 09/22/20 0351 09/22/20  0723  BP: (!) 158/66 (!) 153/72 (!) 148/70 (!) 145/89  Pulse: 71 72 73 76  Resp: 16 20 19 18   Temp: 97.6 F (36.4 C) 98.6 F (37 C) 98.3 F (36.8 C) 97.8 F (36.6 C)  TempSrc: Oral Oral Oral Oral  SpO2: 96% 96% 96% 96%  Weight:  74.8 kg    Height:        Intake/Output Summary (Last 24 hours) at 09/22/2020 0732 Last data filed at 09/22/2020 0522 Gross per 24 hour  Intake 1403.8 ml  Output 2150 ml  Net -746.2 ml   Filed Weights   09/20/20 2327 09/21/20 0429 09/22/20 0003  Weight: 75.9 kg 75.9 kg 74.8 kg    Examination:  General exam: No acute distress.  On room air currently  respiratory system: Decreased breath sounds at bases bilaterally, no wheezing Cardiovascular system: Rate controlled, S1-S2 heard Gastrointestinal system: Abdomen is nondistended, soft and nontender.  Bowel sounds are heard  extremities: Trace lower extreme edema; no clubbing  Data Reviewed: I have personally reviewed following labs and imaging studies  CBC: Recent Labs  Lab 09/20/20 1905 09/20/20 2050 09/21/20 0049 09/22/20 0427  WBC 6.6 8.0 8.2 7.8  HGB 13.6 13.6 13.7 12.3  HCT 40.4 39.0 40.3 34.8*  MCV 87.4 85.7 85.0 85.1  PLT 203 160 213 858   Basic Metabolic Panel: Recent Labs  Lab 09/20/20 1905 09/21/20 0049 09/22/20 0427  NA 140 142 141  K 4.0 3.9 3.8  CL 105 107 107  CO2 23 25 24   GLUCOSE 132* 125* 114*  BUN 17 15 15   CREATININE 0.89 0.81 0.81  CALCIUM 9.3 9.4 9.3  MG  --   --  2.0   GFR: Estimated Creatinine Clearance: 54.6 mL/min (by C-G formula  based on SCr of 0.81 mg/dL). Liver Function Tests: Recent Labs  Lab 09/20/20 1905 09/22/20 0427  AST 28 20  ALT 28 22  ALKPHOS 116 100  BILITOT 0.7 0.8  PROT 6.6 5.9*  ALBUMIN 3.8 3.4*   No results for input(s): LIPASE, AMYLASE in the last 168 hours. No results for input(s): AMMONIA in the last 168 hours. Coagulation Profile: No results for input(s): INR, PROTIME in the last 168 hours. Cardiac Enzymes: No results  for input(s): CKTOTAL, CKMB, CKMBINDEX, TROPONINI in the last 168 hours. BNP (last 3 results) No results for input(s): PROBNP in the last 8760 hours. HbA1C: Recent Labs    09/22/20 0427  HGBA1C 6.0*   CBG: Recent Labs  Lab 09/20/20 1922  GLUCAP 108*   Lipid Profile: Recent Labs    09/21/20 0049  CHOL 158  HDL 55  LDLCALC 73  TRIG 149  CHOLHDL 2.9   Thyroid Function Tests: No results for input(s): TSH, T4TOTAL, FREET4, T3FREE, THYROIDAB in the last 72 hours. Anemia Panel: No results for input(s): VITAMINB12, FOLATE, FERRITIN, TIBC, IRON, RETICCTPCT in the last 72 hours. Sepsis Labs: No results for input(s): PROCALCITON, LATICACIDVEN in the last 168 hours.  Recent Results (from the past 240 hour(s))  SARS CORONAVIRUS 2 (TAT 6-24 HRS) Nasopharyngeal Nasopharyngeal Swab     Status: None   Collection Time: 09/20/20  8:51 PM   Specimen: Nasopharyngeal Swab  Result Value Ref Range Status   SARS Coronavirus 2 NEGATIVE NEGATIVE Final    Comment: (NOTE) SARS-CoV-2 target nucleic acids are NOT DETECTED.  The SARS-CoV-2 RNA is generally detectable in upper and lower respiratory specimens during the acute phase of infection. Negative results do not preclude SARS-CoV-2 infection, do not rule out co-infections with other pathogens, and should not be used as the sole basis for treatment or other patient management decisions. Negative results must be combined with clinical observations, patient history, and epidemiological information. The expected result is Negative.  Fact Sheet for Patients: SugarRoll.be  Fact Sheet for Healthcare Providers: https://www.woods-mathews.com/  This test is not yet approved or cleared by the [place] FDA and  has been authorized for detection and/or diagnosis of SARS-CoV-2 by FDA under an Emergency Use Authorization (EUA). This EUA will remain  in effect (meaning this test can be used) for the  duration of the COVID-19 declaration under Se ction 564(b)(1) of the Act, 21 U.S.C. section 360bbb-3(b)(1), unless the authorization is terminated or revoked sooner.  Performed at [facility], [place] [address] [place], [place] [address]          Radiology Studies: CT Angio Chest PE W and/or Wo Contrast  Result Date: 09/20/2020 CLINICAL DATA:  High probability of pulmonary embolus. EXAM: CT ANGIOGRAPHY CHEST WITH CONTRAST TECHNIQUE: Multidetector CT imaging of the chest was performed using the standard protocol during bolus administration of intravenous contrast. Multiplanar CT image reconstructions and MIPs were obtained to evaluate the vascular anatomy. CONTRAST:  124mL OMNIPAQUE IOHEXOL 350 MG/ML SOLN COMPARISON:  August 20, 2014. FINDINGS: Cardiovascular: Satisfactory opacification of the pulmonary arteries to the segmental level. No evidence of pulmonary embolism. Mild cardiomegaly is noted. No pericardial effusion. Atherosclerosis of thoracic aorta is noted without aneurysm formation. Mediastinum/Nodes: No enlarged mediastinal, hilar, or axillary lymph nodes. Thyroid gland, trachea, and esophagus demonstrate no significant findings. Lungs/Pleura: No pneumothorax or pleural effusion is noted. Mosaic pattern is seen throughout both upper lobes concerning for air trapping secondary to small airways disease or possibly minimal multifocal inflammation. Minimal  bibasilar subsegmental atelectasis is noted. Upper Abdomen: No acute abnormality. Musculoskeletal: No chest wall abnormality. No acute or significant osseous findings. Review of the MIP images confirms the above findings. IMPRESSION: 1. No definite evidence of pulmonary embolus. 2. Mosaic pattern is seen throughout both upper lobes concerning for air trapping secondary to small airways disease or possibly minimal multifocal inflammation. 3. Minimal bibasilar subsegmental atelectasis is noted. 4. Aortic atherosclerosis. Aortic  Atherosclerosis (ICD10-I70.0). Electronically Signed   By: Marijo Conception M.D.   On: 09/20/2020 20:46   DG Chest Portable 1 View  Result Date: 09/20/2020 CLINICAL DATA:  Chest pain EXAM: PORTABLE CHEST 1 VIEW COMPARISON:  July 15, 2014 FINDINGS: The heart size and mediastinal contours are within normal limits. Aortic knob calcifications are seen. There is prominence of the central pulmonary vasculature. Surgical clips are seen overlying the left lower lung. The visualized skeletal structures are unremarkable. IMPRESSION: Mild pulmonary vascular congestion. Electronically Signed   By: Prudencio Pair M.D.   On: 09/20/2020 19:13   ECHOCARDIOGRAM COMPLETE  Result Date: 09/21/2020    ECHOCARDIOGRAM REPORT   Patient Name:   AIRIKA ALKHATIB [facility] Date of Exam: 09/21/2020 Medical Rec #:  [id number]    Height:       63.0 in Accession #:    [id number]   Weight:       167.4 lb Date of Birth:  March 24, 1941    BSA:          1.793 m Patient Age:    71 years     BP:           155/72 mmHg Patient Gender: F            HR:           64 bpm. Exam Location:  Inpatient Procedure: 2D Echo Indications:    chest pain  History:        Patient has no prior history of Echocardiogram examinations.                 NSTEMI, Arrythmias:LBBB; Risk Factors:Hypertension.  Sonographer:    Johny Chess Referring Phys: [id number] [place]  1. Akinesis of the distal septum with overall low normal LV function.  2. Left ventricular ejection fraction, by estimation, is 50 to 55%. The left ventricle has low normal function. The left ventricle demonstrates regional wall motion abnormalities (see scoring diagram/findings for description). Left ventricular diastolic  parameters are consistent with Grade I diastolic dysfunction (impaired relaxation).  3. Right ventricular systolic function is normal. The right ventricular size is normal. There is moderately elevated pulmonary artery systolic pressure.  4. Left atrial size was mildly dilated.  5.  The mitral valve is normal in structure. Mild mitral valve regurgitation. No evidence of mitral stenosis.  6. The aortic valve is tricuspid. Aortic valve regurgitation is not visualized. No aortic stenosis is present.  7. The inferior vena cava is normal in size with greater than 50% respiratory variability, suggesting right atrial pressure of 3 mmHg. FINDINGS  Left Ventricle: Left ventricular ejection fraction, by estimation, is 50 to 55%. The left ventricle has low normal function. The left ventricle demonstrates regional wall motion abnormalities. The left ventricular internal cavity size was normal in size. There is no left ventricular hypertrophy. Abnormal (paradoxical) septal motion, consistent with left bundle branch block. Left ventricular diastolic parameters are consistent with Grade I diastolic dysfunction (impaired relaxation). Right Ventricle: The right ventricular size is normal.Right ventricular systolic function is normal. There  is moderately elevated pulmonary artery systolic pressure. The tricuspid regurgitant velocity is 3.46 m/s, and with an assumed right atrial pressure of 3 mmHg, the estimated right ventricular systolic pressure is 06.3 mmHg. Left Atrium: Left atrial size was mildly dilated. Right Atrium: Right atrial size was normal in size. Pericardium: There is no evidence of pericardial effusion. Mitral Valve: The mitral valve is normal in structure. Mild mitral valve regurgitation. No evidence of mitral valve stenosis. Tricuspid Valve: The tricuspid valve is normal in structure. Tricuspid valve regurgitation is mild . No evidence of tricuspid stenosis. Aortic Valve: The aortic valve is tricuspid. Aortic valve regurgitation is not visualized. No aortic stenosis is present. Pulmonic Valve: The pulmonic valve was not well visualized. Pulmonic valve regurgitation is not visualized. No evidence of pulmonic stenosis. Aorta: The aortic root is normal in size and structure. Venous: The inferior  vena cava is normal in size with greater than 50% respiratory variability, suggesting right atrial pressure of 3 mmHg. IAS/Shunts: No atrial level shunt detected by color flow Doppler. Additional Comments: Akinesis of the distal septum with overall low normal LV function.  LEFT VENTRICLE PLAX 2D LVIDd:         4.70 cm      Diastology LVIDs:         3.00 cm      LV e' medial:    4.03 cm/s LV PW:         1.00 cm      LV E/e' medial:  28.5 LV IVS:        1.10 cm      LV e' lateral:   4.68 cm/s                             LV E/e' lateral: 24.6  LV Volumes (MOD) LV vol d, MOD A2C: 118.0 ml LV vol d, MOD A4C: 110.0 ml LV vol s, MOD A2C: 58.2 ml LV vol s, MOD A4C: 58.7 ml LV SV MOD A2C:     59.8 ml LV SV MOD A4C:     110.0 ml LV SV MOD BP:      58.1 ml RIGHT VENTRICLE             IVC RV S prime:     10.70 cm/s  IVC diam: 1.70 cm TAPSE (M-mode): 1.9 cm LEFT ATRIUM             Index       RIGHT ATRIUM           Index LA diam:        4.00 cm 2.23 cm/m  RA Area:     11.50 cm LA Vol (A2C):   64.9 ml 36.20 ml/m RA Volume:   25.00 ml  13.94 ml/m LA Vol (A4C):   53.1 ml 29.62 ml/m LA Biplane Vol: 59.1 ml 32.97 ml/m  AORTIC VALVE LVOT Vmax:   89.50 cm/s LVOT Vmean:  57.300 cm/s LVOT VTI:    0.207 m  AORTA Ao Asc diam: 3.10 cm MITRAL VALVE                TRICUSPID VALVE MV Area (PHT): 3.31 cm     TR Peak grad:   47.9 mmHg MV Decel Time: 229 msec     TR Vmax:        346.00 cm/s MR Peak grad: 103.6 mmHg MR Mean grad: 65.0 mmHg     SHUNTS MR Vmax:  509.00 cm/s   Systemic VTI: 0.21 m MR Vmean:     383.0 cm/s MV E velocity: 115.00 cm/s MV A velocity: 140.00 cm/s MV E/A ratio:  0.82 Kirk Ruths MD Electronically signed by Kirk Ruths MD Signature Date/Time: 09/21/2020/10:43:40 AM    Final         Scheduled Meds: . anastrozole  1 mg Oral Daily  . aspirin EC  81 mg Oral Daily  . atorvastatin  80 mg Oral Daily  . cholecalciferol  1,000 Units Oral Daily  . LORazepam  1 mg Oral Daily  . losartan  25 mg Oral Daily   . metoprolol tartrate  25 mg Oral BID  . multivitamin with minerals  1 tablet Oral Daily  . pantoprazole  40 mg Oral Daily  . sodium chloride flush  3 mL Intravenous Q12H   Continuous Infusions: . sodium chloride 30 mL/hr at 09/20/20 2209  . sodium chloride    . sodium chloride 1 mL/kg/hr (09/22/20 0522)  . heparin 1,250 Units/hr (09/21/20 2209)  . nitroGLYCERIN 5 mcg/min (09/21/20 1840)          Aline August, MD [facility] 09/22/2020, 7:32 AM

## 2020-09-22 NOTE — Interval H&P Note (Signed)
Cath Lab Visit (complete for each Cath Lab visit)  Clinical Evaluation Leading to the Procedure:   ACS: Yes.    Non-ACS:    Anginal Classification: CCS IV  Anti-ischemic medical therapy: Minimal Therapy (1 class of medications)  Non-Invasive Test Results: No non-invasive testing performed  Prior CABG: No previous CABG      History and Physical Interval Note:  09/22/2020 10:57 AM  Katrina Young  has presented today for surgery, with the diagnosis of unstable angina.  The various methods of treatment have been discussed with the patient and family. After consideration of risks, benefits and other options for treatment, the patient has consented to  Procedure(s): LEFT HEART CATH AND CORONARY ANGIOGRAPHY (N/A) as a surgical intervention.  The patient's history has been reviewed, patient examined, no change in status, stable for surgery.  I have reviewed the patient's chart and labs.  Questions were answered to the patient's satisfaction.     Larae Grooms

## 2020-09-22 NOTE — Progress Notes (Signed)
Progress Note  Patient Name: Katrina Young Date of Encounter: 09/22/2020  [facility] Cardiologist: Rudean Haskell  Subjective   Had a brief tinge of pain yesterday.  Had the same thing on Saturday.  Each lasted less than 10 minutes. Tolerating IV nitro without difficulty.  Inpatient Medications    Scheduled Meds: . anastrozole  1 mg Oral Daily  . aspirin EC  81 mg Oral Daily  . atorvastatin  80 mg Oral Daily  . cholecalciferol  1,000 Units Oral Daily  . LORazepam  1 mg Oral Daily  . losartan  25 mg Oral Daily  . metoprolol tartrate  25 mg Oral BID  . multivitamin with minerals  1 tablet Oral Daily  . pantoprazole  40 mg Oral Daily  . sodium chloride flush  3 mL Intravenous Q12H   Continuous Infusions: . sodium chloride 30 mL/hr at 09/20/20 2209  . sodium chloride    . sodium chloride 1 mL/kg/hr (09/22/20 0522)  . heparin 1,250 Units/hr (09/21/20 2209)  . nitroGLYCERIN 5 mcg/min (09/21/20 1840)   PRN Meds: sodium chloride, acetaminophen, sodium chloride flush   Vital Signs    Vitals:   09/22/20 0003 09/22/20 0351 09/22/20 0723 09/22/20 0917  BP: (!) 153/72 (!) 148/70 (!) 145/89 (!) 154/78  Pulse: 72 73 76 73  Resp: 20 19 18 16   Temp: 98.6 F (37 C) 98.3 F (36.8 C) 97.8 F (36.6 C) 98 F (36.7 C)  TempSrc: Oral Oral Oral Oral  SpO2: 96% 96% 96% 96%  Weight: 74.8 kg     Height:        Intake/Output Summary (Last 24 hours) at 09/22/2020 1052 Last data filed at 09/22/2020 8366 Gross per 24 hour  Intake 1163.8 ml  Output 1400 ml  Net -236.2 ml   Last 3 Weights 09/22/2020 09/21/2020 09/20/2020  Weight (lbs) 165 lb 167 lb 6.4 oz 167 lb 6.4 oz  Weight (kg) 74.844 kg 75.932 kg 75.932 kg      Telemetry    Normal sinus rhythm- Personally Reviewed  ECG    Normal sinus rhythm with stable left bundle branch block appearance at 10:20 AM..- Personally Reviewed  Physical Exam  Overweight GEN: No acute distress.   Neck: No JVD Cardiac: RRR, no  murmurs, rubs, or gallops.  Respiratory: Clear to auscultation bilaterally. GI: Soft, nontender, non-distended  MS: No edema; No deformity. Neuro:  Nonfocal  Psych: Normal affect   Labs    High Sensitivity Troponin:   Recent Labs  Lab 09/20/20 1905 09/20/20 2050 09/21/20 0049 09/21/20 1205 09/21/20 1347  TROPONINIHS 13 27* 44* 22* 23*      Chemistry Recent Labs  Lab 09/20/20 1905 09/21/20 0049 09/22/20 0427  NA 140 142 141  K 4.0 3.9 3.8  CL 105 107 107  CO2 23 25 24   GLUCOSE 132* 125* 114*  BUN 17 15 15   CREATININE 0.89 0.81 0.81  CALCIUM 9.3 9.4 9.3  PROT 6.6  --  5.9*  ALBUMIN 3.8  --  3.4*  AST 28  --  20  ALT 28  --  22  ALKPHOS 116  --  100  BILITOT 0.7  --  0.8  GFRNONAA >60 >60 >60  ANIONGAP 12 10 10      Hematology Recent Labs  Lab 09/20/20 2050 09/21/20 0049 09/22/20 0427  WBC 8.0 8.2 7.8  RBC 4.55 4.74 4.09  HGB 13.6 13.7 12.3  HCT 39.0 40.3 34.8*  MCV 85.7 85.0 85.1  MCH 29.9  28.9 30.1  MCHC 34.9 34.0 35.3  RDW 12.3 12.2 12.4  PLT 160 213 176    BNP Recent Labs  Lab 09/20/20 1905  BNP 93.4     DDimer No results for input(s): DDIMER in the last 168 hours.   Radiology    CT Angio Chest PE W and/or Wo Contrast  Result Date: 09/20/2020 CLINICAL DATA:  High probability of pulmonary embolus. EXAM: CT ANGIOGRAPHY CHEST WITH CONTRAST TECHNIQUE: Multidetector CT imaging of the chest was performed using the standard protocol during bolus administration of intravenous contrast. Multiplanar CT image reconstructions and MIPs were obtained to evaluate the vascular anatomy. CONTRAST:  137mL OMNIPAQUE IOHEXOL 350 MG/ML SOLN COMPARISON:  August 20, 2014. FINDINGS: Cardiovascular: Satisfactory opacification of the pulmonary arteries to the segmental level. No evidence of pulmonary embolism. Mild cardiomegaly is noted. No pericardial effusion. Atherosclerosis of thoracic aorta is noted without aneurysm formation. Mediastinum/Nodes: No enlarged  mediastinal, hilar, or axillary lymph nodes. Thyroid gland, trachea, and esophagus demonstrate no significant findings. Lungs/Pleura: No pneumothorax or pleural effusion is noted. Mosaic pattern is seen throughout both upper lobes concerning for air trapping secondary to small airways disease or possibly minimal multifocal inflammation. Minimal bibasilar subsegmental atelectasis is noted. Upper Abdomen: No acute abnormality. Musculoskeletal: No chest wall abnormality. No acute or significant osseous findings. Review of the MIP images confirms the above findings. IMPRESSION: 1. No definite evidence of pulmonary embolus. 2. Mosaic pattern is seen throughout both upper lobes concerning for air trapping secondary to small airways disease or possibly minimal multifocal inflammation. 3. Minimal bibasilar subsegmental atelectasis is noted. 4. Aortic atherosclerosis. Aortic Atherosclerosis (ICD10-I70.0). Electronically Signed   By: Marijo Conception M.D.   On: 09/20/2020 20:46   DG Chest Portable 1 View  Result Date: 09/20/2020 CLINICAL DATA:  Chest pain EXAM: PORTABLE CHEST 1 VIEW COMPARISON:  July 15, 2014 FINDINGS: The heart size and mediastinal contours are within normal limits. Aortic knob calcifications are seen. There is prominence of the central pulmonary vasculature. Surgical clips are seen overlying the left lower lung. The visualized skeletal structures are unremarkable. IMPRESSION: Mild pulmonary vascular congestion. Electronically Signed   By: Prudencio Pair M.D.   On: 09/20/2020 19:13   ECHOCARDIOGRAM COMPLETE  Result Date: 09/21/2020    ECHOCARDIOGRAM REPORT   Patient Name:   Katrina Young [facility] Date of Exam: 09/21/2020 Medical Rec #:  [id number]    Height:       63.0 in Accession #:    [id number]   Weight:       167.4 lb Date of Birth:  Dec 13, 1940    BSA:          1.793 m Patient Age:    80 years     BP:           155/72 mmHg Patient Gender: F            HR:           64 bpm. Exam Location:  Inpatient  Procedure: 2D Echo Indications:    chest pain  History:        Patient has no prior history of Echocardiogram examinations.                 NSTEMI, Arrythmias:LBBB; Risk Factors:Hypertension.  Sonographer:    Johny Chess Referring Phys: [id number] [place]  1. Akinesis of the distal septum with overall low normal LV function.  2. Left ventricular ejection fraction, by estimation, is 50 to 55%. The  left ventricle has low normal function. The left ventricle demonstrates regional wall motion abnormalities (see scoring diagram/findings for description). Left ventricular diastolic  parameters are consistent with Grade I diastolic dysfunction (impaired relaxation).  3. Right ventricular systolic function is normal. The right ventricular size is normal. There is moderately elevated pulmonary artery systolic pressure.  4. Left atrial size was mildly dilated.  5. The mitral valve is normal in structure. Mild mitral valve regurgitation. No evidence of mitral stenosis.  6. The aortic valve is tricuspid. Aortic valve regurgitation is not visualized. No aortic stenosis is present.  7. The inferior vena cava is normal in size with greater than 50% respiratory variability, suggesting right atrial pressure of 3 mmHg. FINDINGS  Left Ventricle: Left ventricular ejection fraction, by estimation, is 50 to 55%. The left ventricle has low normal function. The left ventricle demonstrates regional wall motion abnormalities. The left ventricular internal cavity size was normal in size. There is no left ventricular hypertrophy. Abnormal (paradoxical) septal motion, consistent with left bundle branch block. Left ventricular diastolic parameters are consistent with Grade I diastolic dysfunction (impaired relaxation). Right Ventricle: The right ventricular size is normal.Right ventricular systolic function is normal. There is moderately elevated pulmonary artery systolic pressure. The tricuspid regurgitant velocity is 3.46  m/s, and with an assumed right atrial pressure of 3 mmHg, the estimated right ventricular systolic pressure is 06.2 mmHg. Left Atrium: Left atrial size was mildly dilated. Right Atrium: Right atrial size was normal in size. Pericardium: There is no evidence of pericardial effusion. Mitral Valve: The mitral valve is normal in structure. Mild mitral valve regurgitation. No evidence of mitral valve stenosis. Tricuspid Valve: The tricuspid valve is normal in structure. Tricuspid valve regurgitation is mild . No evidence of tricuspid stenosis. Aortic Valve: The aortic valve is tricuspid. Aortic valve regurgitation is not visualized. No aortic stenosis is present. Pulmonic Valve: The pulmonic valve was not well visualized. Pulmonic valve regurgitation is not visualized. No evidence of pulmonic stenosis. Aorta: The aortic root is normal in size and structure. Venous: The inferior vena cava is normal in size with greater than 50% respiratory variability, suggesting right atrial pressure of 3 mmHg. IAS/Shunts: No atrial level shunt detected by color flow Doppler. Additional Comments: Akinesis of the distal septum with overall low normal LV function.  LEFT VENTRICLE PLAX 2D LVIDd:         4.70 cm      Diastology LVIDs:         3.00 cm      LV e' medial:    4.03 cm/s LV PW:         1.00 cm      LV E/e' medial:  28.5 LV IVS:        1.10 cm      LV e' lateral:   4.68 cm/s                             LV E/e' lateral: 24.6  LV Volumes (MOD) LV vol d, MOD A2C: 118.0 ml LV vol d, MOD A4C: 110.0 ml LV vol s, MOD A2C: 58.2 ml LV vol s, MOD A4C: 58.7 ml LV SV MOD A2C:     59.8 ml LV SV MOD A4C:     110.0 ml LV SV MOD BP:      58.1 ml RIGHT VENTRICLE             IVC RV S prime:  10.70 cm/s  IVC diam: 1.70 cm TAPSE (M-mode): 1.9 cm LEFT ATRIUM             Index       RIGHT ATRIUM           Index LA diam:        4.00 cm 2.23 cm/m  RA Area:     11.50 cm LA Vol (A2C):   64.9 ml 36.20 ml/m RA Volume:   25.00 ml  13.94 ml/m LA Vol  (A4C):   53.1 ml 29.62 ml/m LA Biplane Vol: 59.1 ml 32.97 ml/m  AORTIC VALVE LVOT Vmax:   89.50 cm/s LVOT Vmean:  57.300 cm/s LVOT VTI:    0.207 m  AORTA Ao Asc diam: 3.10 cm MITRAL VALVE                TRICUSPID VALVE MV Area (PHT): 3.31 cm     TR Peak grad:   47.9 mmHg MV Decel Time: 229 msec     TR Vmax:        346.00 cm/s MR Peak grad: 103.6 mmHg MR Mean grad: 65.0 mmHg     SHUNTS MR Vmax:      509.00 cm/s   Systemic VTI: 0.21 m MR Vmean:     383.0 cm/s MV E velocity: 115.00 cm/s MV A velocity: 140.00 cm/s MV E/A ratio:  0.82 Kirk Ruths MD Electronically signed by Kirk Ruths MD Signature Date/Time: 09/21/2020/10:43:40 AM    Final     Cardiac Studies   No new cardiac imaging  Patient Profile     80 y.o. female with PMH PRIMARY HTN, HLD, H/o breast cancer s/p lumpectomy 2017 and radiation,now in remission, Obesity presented to the ER with c/o chest pain.  Assessment & Plan    1. Unstable angina pectoris: 80 year old with risk factors including age, primary hypertension, and hyperlipidemia who presents with chest pain at rest, responsive to nitroglycerin.  No evidence of myocardial necrosis.  Diagnostic coronary angiography to define anatomy and help delineate therapy is indicated. 2. Left bundle branch block: Chronic in comparison to June 2017. 3. Primary hypertension: Target is 130/80 or less 4. Hyperlipidemia: Target is less than 70   The patient was counseled to undergo left heart catheterization, coronary angiography, and possible percutaneous coronary intervention with stent implantation. The procedural risks and benefits were discussed in detail. The risks discussed included death, stroke, myocardial infarction, life-threatening bleeding, limb ischemia, kidney injury, allergy, and possible emergency cardiac surgery. The risk of these significant complications were estimated to occur less than 1% of the time. After discussion, the patient has agreed to proceed.       For  questions or updates, please contact [place] Please consult [email or web address] for contact info under        Signed, Sinclair Grooms, MD  09/22/2020, 10:52 AM

## 2020-09-22 NOTE — Progress Notes (Signed)
Patient ambulate in hallway denies CP, dizziness, V/V at baseline cath site level 0. D/c instruction explain and copy given to patient and family. Pt. D/c home per order.

## 2020-09-22 NOTE — Progress Notes (Signed)
[place] for Heparin Indication: chest pain/ACS  Allergies  Allergen Reactions  . Codeine Nausea Only  . Morphine And Related Nausea Only    Patient Measurements: Height: 5\' 3"  (160 cm) Weight: 74.8 kg (165 lb) IBW/kg (Calculated) : 52.4 Heparin Dosing Weight: 69.8 kg  Vital Signs: Temp: 98.3 F (36.8 C) (02/28 0351) Temp Source: Oral (02/28 0351) BP: 148/70 (02/28 0351) Pulse Rate: 73 (02/28 0351)  Labs: Recent Labs    09/20/20 1905 09/20/20 2050 09/21/20 0049 09/21/20 0854 09/21/20 1205 09/21/20 1347 09/21/20 1911 09/22/20 0427  HGB 13.6 13.6 13.7  --   --   --   --  12.3  HCT 40.4 39.0 40.3  --   --   --   --  34.8*  PLT 203 160 213  --   --   --   --  176  HEPARINUNFRC  --   --   --  0.12*  --   --  0.17* 0.42  CREATININE 0.89  --  0.81  --   --   --   --   --   TROPONINIHS 13 27* 44*  --  22* 23*  --   --     Estimated Creatinine Clearance: 54.6 mL/min (by C-G formula based on SCr of 0.81 mg/dL).  Assessment: Patient is a 80 yof that is being admitted for chest pain. The patient was not found to have an initial elevated trop however cardiology is concerned for ACS at this time and has asked pharmacy to dose heparin.   Heparin level remains subtherapeutic (0.17) on gtt at 1050 units/hr. No issues with line or bleeding reported per RN. Noted plan for cath tomorrow.  2/28 AM update:  Heparin level therapeutic after rate increase   Goal of Therapy:  Heparin level 0.3-0.7 units/ml Monitor platelets by anticoagulation protocol: Yes   Plan:  Cont heparin 1250 units/hr 1400 heparin level  Narda Bonds, PharmD, BCPS Clinical Pharmacist Phone: [phone]

## 2020-09-22 NOTE — Progress Notes (Addendum)
Addendum to earlier note:  Reviewed coronary angiography.  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  No culprit for anginal symptoms.  Possible INOCA and recommend continuing beta-blocker as prior to admission (atenolol 100 mg/day), agree with adding ARB (losartan) to achieve blood pressure control target of 130/80 mmHg.  Discontinue IV nitroglycerin and ambulate.  If no recurrent chest pain okay to discharge today.  Would recommend continuing high intensity statin therapy and aspirin until follow-up since there was mild elevation in troponin by.  If ambulates without difficulty, okay to discharge later today.

## 2020-09-22 NOTE — Discharge Summary (Signed)
Physician Discharge Summary  Katrina Young ZOX:[id number] DOB: November 15, 1940 DOA: 09/20/2020  PCP: Leanna Battles, MD  Admit date: 09/20/2020 Discharge date: 09/22/2020  Admitted From: Home Disposition: Home  Recommendations for Outpatient Follow-up:  1. Follow up with PCP in 1 week with repeat CBC/BMP 2. Outpatient follow-up with cardiology 3. Follow up in ED if symptoms worsen or new appear   Home Health: No Equipment/Devices: None  Discharge Condition: Stable CODE STATUS: Full Diet recommendation: Heart healthy  Brief/Interim Summary: 80 y.o.femalewith medical history significant forbreast and ovarian cancer on anastrozole,chronic anxiety,essential hypertension, hyperlipidemia presented with chest pain.  On resignation, EKG showed nonspecific ST-T wave changes.  She was started on nitroglycerin drip and cardiology was consulted.  She underwent cardiac catheterization on 09/22/2020 which showed no significant CAD.  Chest pain is resolved.  Cardiology has cleared the patient for discharge.  She will be discharged home with outpatient follow-up with PCP and cardiology.  Discharge Diagnoses:   Chest pain/unstable angina -Presented with chest pain with nonspecific ST-T wave changes.  Troponins did not trend up significantly.  Currently denies any chest pain -Echo showed EF of 50 to 55% with grade 1 diastolic dysfunction.    Treated with heparin and nitroglycerin drips.   - She underwent cardiac catheterization on 09/22/2020 which showed no significant CAD.  Chest pain is resolved.  Cardiology has cleared the patient for discharge.  She will be discharged home with outpatient follow-up with PCP and cardiology. -Continue aspirin, high intensity statin and atenolol on discharge.  Essential hypertension -Blood pressure elevated.  Continue atenolol.  Losartan was started during the hospitalization which will be continued on discharge.  Hyperlipidemia--continue statin  Chronic  anxiety -Continue home Ativan  GERD -Continue PPI  History of breast cancer and ovarian cancer -Continue anastrozole.  Outpatient follow-up with oncology  Discharge Instructions   Allergies as of 09/22/2020      Reactions   Codeine Nausea Only   Morphine And Related Nausea Only      Medication List    STOP taking these medications   pravastatin 20 MG tablet Commonly known as: PRAVACHOL     TAKE these medications   anastrozole 1 MG tablet Commonly known as: ARIMIDEX Take 1 tablet (1 mg total) by mouth daily.   aspirin EC 81 MG tablet Take 81 mg by mouth daily.   atenolol 100 MG tablet Commonly known as: TENORMIN Take 1 tablet (100 mg total) by mouth every morning.   atorvastatin 80 MG tablet Commonly known as: LIPITOR Take 1 tablet (80 mg total) by mouth daily. Start taking on: September 23, 2020   AZO TABS PO Take 2 tablets by mouth daily. Pt takes as needed when she has a flare-up   CALCIUM SOFT CHEWS PO Take by mouth.   cholecalciferol 1000 units tablet Commonly known as: VITAMIN D Take 1,000 Units by mouth daily.   esomeprazole 40 MG capsule Commonly known as: NEXIUM Take 40 mg by mouth daily before breakfast.   LORazepam 1 MG tablet Commonly known as: ATIVAN Take 1 mg by mouth daily.   losartan 25 MG tablet Commonly known as: COZAAR Take 1 tablet (25 mg total) by mouth daily. Start taking on: September 23, 2020   multivitamin with minerals Tabs tablet Take 1 tablet by mouth daily.   OSTEO BI-FLEX/5-LOXIN ADVANCED PO Take by mouth.   PROBIOTIC PO Take 1 tablet by mouth once as needed (indigestion).        Follow-up Information    Leanna Battles,  MD. Schedule an appointment as soon as possible for a visit in 1 week(s).   Specialty: Internal Medicine Contact information: [place] [address] [phone]              Allergies  Allergen Reactions  . Codeine Nausea Only  . Morphine And Related Nausea Only     Consultations:  Cardiology   Procedures/Studies: CT Angio Chest PE W and/or Wo Contrast  Result Date: 09/20/2020 CLINICAL DATA:  High probability of pulmonary embolus. EXAM: CT ANGIOGRAPHY CHEST WITH CONTRAST TECHNIQUE: Multidetector CT imaging of the chest was performed using the standard protocol during bolus administration of intravenous contrast. Multiplanar CT image reconstructions and MIPs were obtained to evaluate the vascular anatomy. CONTRAST:  12mL OMNIPAQUE IOHEXOL 350 MG/ML SOLN COMPARISON:  August 20, 2014. FINDINGS: Cardiovascular: Satisfactory opacification of the pulmonary arteries to the segmental level. No evidence of pulmonary embolism. Mild cardiomegaly is noted. No pericardial effusion. Atherosclerosis of thoracic aorta is noted without aneurysm formation. Mediastinum/Nodes: No enlarged mediastinal, hilar, or axillary lymph nodes. Thyroid gland, trachea, and esophagus demonstrate no significant findings. Lungs/Pleura: No pneumothorax or pleural effusion is noted. Mosaic pattern is seen throughout both upper lobes concerning for air trapping secondary to small airways disease or possibly minimal multifocal inflammation. Minimal bibasilar subsegmental atelectasis is noted. Upper Abdomen: No acute abnormality. Musculoskeletal: No chest wall abnormality. No acute or significant osseous findings. Review of the MIP images confirms the above findings. IMPRESSION: 1. No definite evidence of pulmonary embolus. 2. Mosaic pattern is seen throughout both upper lobes concerning for air trapping secondary to small airways disease or possibly minimal multifocal inflammation. 3. Minimal bibasilar subsegmental atelectasis is noted. 4. Aortic atherosclerosis. Aortic Atherosclerosis (ICD10-I70.0). Electronically Signed   By: Marijo Conception M.D.   On: 09/20/2020 20:46   CARDIAC CATHETERIZATION  Addendum Date: 09/22/2020    Mid RCA lesion is 25% stenosed.  Dist RCA lesion is 25% stenosed.   Mid Cx lesion is 10% stenosed.  Mid LAD lesion is 25% stenosed.  The left ventricular systolic function is normal.  LV end diastolic pressure is mildly elevated. LVEDP 17 mmHg.  The left ventricular ejection fraction is 50-55% by visual estimate.  There is no aortic valve stenosis.  Nonobstructive, mild coronary artery disease.  Tortuous right radial artery which makes navigation to the ascending aorta difficult and torquing catheters difficult.  If emergency heart cath was needed, would not use right radial approach.  Prowater interventional wire did help navigate the tortuosity in the right radial. Continue medical therapy for HTN.    Result Date: 09/22/2020  Mid RCA lesion is 25% stenosed.  Dist RCA lesion is 25% stenosed.  Mid Cx lesion is 10% stenosed.  Mid LAD lesion is 25% stenosed.  The left ventricular systolic function is normal.  LV end diastolic pressure is mildly elevated. LVEDP 17 mmHg.  The left ventricular ejection fraction is 50-55% by visual estimate.  There is no aortic valve stenosis.  Nonobstructive, mild coronary artery disease.  Tortuous right radial artery which makes navigation to the ascending aorta difficult and torquing catheters difficult.  If emergency heart cath was needed, would not use right radial approach.  Prowater interventional wire did help navigate the tortuosity in the right radial. Continue medical therapy for HTN.    DG Chest Portable 1 View  Result Date: 09/20/2020 CLINICAL DATA:  Chest pain EXAM: PORTABLE CHEST 1 VIEW COMPARISON:  July 15, 2014 FINDINGS: The heart size and mediastinal contours are within  normal limits. Aortic knob calcifications are seen. There is prominence of the central pulmonary vasculature. Surgical clips are seen overlying the left lower lung. The visualized skeletal structures are unremarkable. IMPRESSION: Mild pulmonary vascular congestion. Electronically Signed   By: Prudencio Pair M.D.   On: 09/20/2020 19:13    ECHOCARDIOGRAM COMPLETE  Result Date: 09/21/2020    ECHOCARDIOGRAM REPORT   Patient Name:   BRANTLEIGH MIFFLIN [facility] Date of Exam: 09/21/2020 Medical Rec #:  [id number]    Height:       63.0 in Accession #:    [id number]   Weight:       167.4 lb Date of Birth:  1941/01/05    BSA:          1.793 m Patient Age:    58 years     BP:           155/72 mmHg Patient Gender: F            HR:           64 bpm. Exam Location:  Inpatient Procedure: 2D Echo Indications:    chest pain  History:        Patient has no prior history of Echocardiogram examinations.                 NSTEMI, Arrythmias:LBBB; Risk Factors:Hypertension.  Sonographer:    Johny Chess Referring Phys: [id number] [place]  1. Akinesis of the distal septum with overall low normal LV function.  2. Left ventricular ejection fraction, by estimation, is 50 to 55%. The left ventricle has low normal function. The left ventricle demonstrates regional wall motion abnormalities (see scoring diagram/findings for description). Left ventricular diastolic  parameters are consistent with Grade I diastolic dysfunction (impaired relaxation).  3. Right ventricular systolic function is normal. The right ventricular size is normal. There is moderately elevated pulmonary artery systolic pressure.  4. Left atrial size was mildly dilated.  5. The mitral valve is normal in structure. Mild mitral valve regurgitation. No evidence of mitral stenosis.  6. The aortic valve is tricuspid. Aortic valve regurgitation is not visualized. No aortic stenosis is present.  7. The inferior vena cava is normal in size with greater than 50% respiratory variability, suggesting right atrial pressure of 3 mmHg. FINDINGS  Left Ventricle: Left ventricular ejection fraction, by estimation, is 50 to 55%. The left ventricle has low normal function. The left ventricle demonstrates regional wall motion abnormalities. The left ventricular internal cavity size was normal in size. There is no left  ventricular hypertrophy. Abnormal (paradoxical) septal motion, consistent with left bundle branch block. Left ventricular diastolic parameters are consistent with Grade I diastolic dysfunction (impaired relaxation). Right Ventricle: The right ventricular size is normal.Right ventricular systolic function is normal. There is moderately elevated pulmonary artery systolic pressure. The tricuspid regurgitant velocity is 3.46 m/s, and with an assumed right atrial pressure of 3 mmHg, the estimated right ventricular systolic pressure is 43.1 mmHg. Left Atrium: Left atrial size was mildly dilated. Right Atrium: Right atrial size was normal in size. Pericardium: There is no evidence of pericardial effusion. Mitral Valve: The mitral valve is normal in structure. Mild mitral valve regurgitation. No evidence of mitral valve stenosis. Tricuspid Valve: The tricuspid valve is normal in structure. Tricuspid valve regurgitation is mild . No evidence of tricuspid stenosis. Aortic Valve: The aortic valve is tricuspid. Aortic valve regurgitation is not visualized. No aortic stenosis is present. Pulmonic Valve: The pulmonic valve was not well visualized.  Pulmonic valve regurgitation is not visualized. No evidence of pulmonic stenosis. Aorta: The aortic root is normal in size and structure. Venous: The inferior vena cava is normal in size with greater than 50% respiratory variability, suggesting right atrial pressure of 3 mmHg. IAS/Shunts: No atrial level shunt detected by color flow Doppler. Additional Comments: Akinesis of the distal septum with overall low normal LV function.  LEFT VENTRICLE PLAX 2D LVIDd:         4.70 cm      Diastology LVIDs:         3.00 cm      LV e' medial:    4.03 cm/s LV PW:         1.00 cm      LV E/e' medial:  28.5 LV IVS:        1.10 cm      LV e' lateral:   4.68 cm/s                             LV E/e' lateral: 24.6  LV Volumes (MOD) LV vol d, MOD A2C: 118.0 ml LV vol d, MOD A4C: 110.0 ml LV vol s, MOD A2C:  58.2 ml LV vol s, MOD A4C: 58.7 ml LV SV MOD A2C:     59.8 ml LV SV MOD A4C:     110.0 ml LV SV MOD BP:      58.1 ml RIGHT VENTRICLE             IVC RV S prime:     10.70 cm/s  IVC diam: 1.70 cm TAPSE (M-mode): 1.9 cm LEFT ATRIUM             Index       RIGHT ATRIUM           Index LA diam:        4.00 cm 2.23 cm/m  RA Area:     11.50 cm LA Vol (A2C):   64.9 ml 36.20 ml/m RA Volume:   25.00 ml  13.94 ml/m LA Vol (A4C):   53.1 ml 29.62 ml/m LA Biplane Vol: 59.1 ml 32.97 ml/m  AORTIC VALVE LVOT Vmax:   89.50 cm/s LVOT Vmean:  57.300 cm/s LVOT VTI:    0.207 m  AORTA Ao Asc diam: 3.10 cm MITRAL VALVE                TRICUSPID VALVE MV Area (PHT): 3.31 cm     TR Peak grad:   47.9 mmHg MV Decel Time: 229 msec     TR Vmax:        346.00 cm/s MR Peak grad: 103.6 mmHg MR Mean grad: 65.0 mmHg     SHUNTS MR Vmax:      509.00 cm/s   Systemic VTI: 0.21 m MR Vmean:     383.0 cm/s MV E velocity: 115.00 cm/s MV A velocity: 140.00 cm/s MV E/A ratio:  0.82 Kirk Ruths MD Electronically signed by Kirk Ruths MD Signature Date/Time: 09/21/2020/10:43:40 AM    Final        Subjective: Patient seen and examined at bedside.  No overnight fever, vomiting, abdominal pain or worsening shortness of breath.  Denies any current chest pain.  Discharge Exam: Vitals:   09/22/20 1132 09/22/20 1136  BP: (!) 184/77 (!) 193/79  Pulse: 71 88  Resp: 17 12  Temp:    SpO2: 97% (!) 0%    General: Pt is  alert, awake, not in acute distress Cardiovascular: rate controlled, S1/S2 + Respiratory: bilateral decreased breath sounds at bases Abdominal: Soft, NT, ND, bowel sounds + Extremities: no edema, no cyanosis    The results of significant diagnostics from this hospitalization (including imaging, microbiology, ancillary and laboratory) are listed below for reference.     Microbiology: Recent Results (from the past 240 hour(s))  SARS CORONAVIRUS 2 (TAT 6-24 HRS) Nasopharyngeal Nasopharyngeal Swab     Status: None    Collection Time: 09/20/20  8:51 PM   Specimen: Nasopharyngeal Swab  Result Value Ref Range Status   SARS Coronavirus 2 NEGATIVE NEGATIVE Final    Comment: (NOTE) SARS-CoV-2 target nucleic acids are NOT DETECTED.  The SARS-CoV-2 RNA is generally detectable in upper and lower respiratory specimens during the acute phase of infection. Negative results do not preclude SARS-CoV-2 infection, do not rule out co-infections with other pathogens, and should not be used as the sole basis for treatment or other patient management decisions. Negative results must be combined with clinical observations, patient history, and epidemiological information. The expected result is Negative.  Fact Sheet for Patients: SugarRoll.be  Fact Sheet for Healthcare Providers: https://www.woods-mathews.com/  This test is not yet approved or cleared by the [place] FDA and  has been authorized for detection and/or diagnosis of SARS-CoV-2 by FDA under an Emergency Use Authorization (EUA). This EUA will remain  in effect (meaning this test can be used) for the duration of the COVID-19 declaration under Se ction 564(b)(1) of the Act, 21 U.S.C. section 360bbb-3(b)(1), unless the authorization is terminated or revoked sooner.  Performed at [facility], [place] [address] [place], [place] [address]      Labs: BNP (last 3 results) Recent Labs    09/20/20 1905  BNP 93.2   Basic Metabolic Panel: Recent Labs  Lab 09/20/20 1905 09/21/20 0049 09/22/20 0427  NA 140 142 141  K 4.0 3.9 3.8  CL 105 107 107  CO2 23 25 24   GLUCOSE 132* 125* 114*  BUN 17 15 15   CREATININE 0.89 0.81 0.81  CALCIUM 9.3 9.4 9.3  MG  --   --  2.0   Liver Function Tests: Recent Labs  Lab 09/20/20 1905 09/22/20 0427  AST 28 20  ALT 28 22  ALKPHOS 116 100  BILITOT 0.7 0.8  PROT 6.6 5.9*  ALBUMIN 3.8 3.4*   No results for input(s): LIPASE, AMYLASE in the last 168 hours. No  results for input(s): AMMONIA in the last 168 hours. CBC: Recent Labs  Lab 09/20/20 1905 09/20/20 2050 09/21/20 0049 09/22/20 0427  WBC 6.6 8.0 8.2 7.8  HGB 13.6 13.6 13.7 12.3  HCT 40.4 39.0 40.3 34.8*  MCV 87.4 85.7 85.0 85.1  PLT 203 160 213 176   Cardiac Enzymes: No results for input(s): CKTOTAL, CKMB, CKMBINDEX, TROPONINI in the last 168 hours. BNP: Invalid input(s): POCBNP CBG: Recent Labs  Lab 09/20/20 1922  GLUCAP 108*   D-Dimer No results for input(s): DDIMER in the last 72 hours. Hgb A1c Recent Labs    09/22/20 0427  HGBA1C 6.0*   Lipid Profile Recent Labs    09/21/20 0049  CHOL 158  HDL 55  LDLCALC 73  TRIG 149  CHOLHDL 2.9   Thyroid function studies No results for input(s): TSH, T4TOTAL, T3FREE, THYROIDAB in the last 72 hours.  Invalid input(s): FREET3 Anemia work up No results for input(s): VITAMINB12, FOLATE, FERRITIN, TIBC, IRON, RETICCTPCT in the last 72 hours. Urinalysis  Component Value Date/Time   COLORURINE YELLOW 04/15/2010 0343   APPEARANCEUR CLOUDY (A) 04/15/2010 0343   LABSPEC 1.010 06/24/2016 1357   PHURINE 7.5 06/24/2016 1357   PHURINE 7.5 04/15/2010 0343   GLUCOSEU Negative 06/24/2016 1357   HGBUR Trace 06/24/2016 1357   HGBUR NEGATIVE 04/15/2010 0343   BILIRUBINUR Negative 06/24/2016 1357   KETONESUR Negative 06/24/2016 1357   KETONESUR NEGATIVE 04/15/2010 0343   PROTEINUR Negative 06/24/2016 1357   PROTEINUR NEGATIVE 04/15/2010 0343   UROBILINOGEN 0.2 06/24/2016 1357   NITRITE Negative 06/24/2016 1357   NITRITE NEGATIVE 04/15/2010 0343   LEUKOCYTESUR Moderate 06/24/2016 1357   Sepsis Labs Invalid input(s): PROCALCITONIN,  WBC,  LACTICIDVEN Microbiology Recent Results (from the past 240 hour(s))  SARS CORONAVIRUS 2 (TAT 6-24 HRS) Nasopharyngeal Nasopharyngeal Swab     Status: None   Collection Time: 09/20/20  8:51 PM   Specimen: Nasopharyngeal Swab  Result Value Ref Range Status   SARS Coronavirus 2 NEGATIVE  NEGATIVE Final    Comment: (NOTE) SARS-CoV-2 target nucleic acids are NOT DETECTED.  The SARS-CoV-2 RNA is generally detectable in upper and lower respiratory specimens during the acute phase of infection. Negative results do not preclude SARS-CoV-2 infection, do not rule out co-infections with other pathogens, and should not be used as the sole basis for treatment or other patient management decisions. Negative results must be combined with clinical observations, patient history, and epidemiological information. The expected result is Negative.  Fact Sheet for Patients: SugarRoll.be  Fact Sheet for Healthcare Providers: https://www.woods-mathews.com/  This test is not yet approved or cleared by the [place] FDA and  has been authorized for detection and/or diagnosis of SARS-CoV-2 by FDA under an Emergency Use Authorization (EUA). This EUA will remain  in effect (meaning this test can be used) for the duration of the COVID-19 declaration under Se ction 564(b)(1) of the Act, 21 U.S.C. section 360bbb-3(b)(1), unless the authorization is terminated or revoked sooner.  Performed at [facility], [place] [address] [place], [place] [address]      Time coordinating discharge: 35 minutes  SIGNED:   Aline August, MD  [facility] 09/22/2020, 1:06 PM

## 2020-09-25 DIAGNOSIS — R195 Other fecal abnormalities: Secondary | ICD-10-CM | POA: Diagnosis not present

## 2020-09-25 DIAGNOSIS — C50912 Malignant neoplasm of unspecified site of left female breast: Secondary | ICD-10-CM | POA: Diagnosis not present

## 2020-09-25 DIAGNOSIS — R0789 Other chest pain: Secondary | ICD-10-CM | POA: Diagnosis not present

## 2020-09-25 DIAGNOSIS — K219 Gastro-esophageal reflux disease without esophagitis: Secondary | ICD-10-CM | POA: Diagnosis not present

## 2020-09-25 DIAGNOSIS — F419 Anxiety disorder, unspecified: Secondary | ICD-10-CM | POA: Diagnosis not present

## 2020-09-25 DIAGNOSIS — E785 Hyperlipidemia, unspecified: Secondary | ICD-10-CM | POA: Diagnosis not present

## 2020-09-25 DIAGNOSIS — I10 Essential (primary) hypertension: Secondary | ICD-10-CM | POA: Diagnosis not present

## 2020-09-25 DIAGNOSIS — I200 Unstable angina: Secondary | ICD-10-CM | POA: Diagnosis not present

## 2020-09-29 DIAGNOSIS — C50912 Malignant neoplasm of unspecified site of left female breast: Secondary | ICD-10-CM | POA: Diagnosis not present

## 2020-09-30 DIAGNOSIS — I10 Essential (primary) hypertension: Secondary | ICD-10-CM | POA: Diagnosis not present

## 2020-09-30 DIAGNOSIS — C50912 Malignant neoplasm of unspecified site of left female breast: Secondary | ICD-10-CM | POA: Diagnosis not present

## 2020-09-30 DIAGNOSIS — M79601 Pain in right arm: Secondary | ICD-10-CM | POA: Diagnosis not present

## 2020-10-13 DIAGNOSIS — N390 Urinary tract infection, site not specified: Secondary | ICD-10-CM | POA: Diagnosis not present

## 2020-10-17 ENCOUNTER — Ambulatory Visit
Admission: RE | Admit: 2020-10-17 | Discharge: 2020-10-17 | Disposition: A | Discharge status: Home / Self Care | Payer: Medicare Other | Source: Ambulatory Visit | Attending: Hematology and Oncology | Admitting: Hematology and Oncology

## 2020-10-17 ENCOUNTER — Other Ambulatory Visit: Payer: Self-pay

## 2020-10-17 DIAGNOSIS — R922 Inconclusive mammogram: Secondary | ICD-10-CM | POA: Diagnosis not present

## 2020-10-17 DIAGNOSIS — Z9889 Other specified postprocedural states: Secondary | ICD-10-CM

## 2020-10-24 NOTE — Progress Notes (Signed)
Cardiology Office Note   Date:  10/30/2020   ID:  MADAILEIN LONDO, DOB January 04, 1941, MRN [id number]  PCP:  Leanna Battles, MD  Cardiologist:   Casen Pryor Martinique, MD   Chief Complaint  Patient presents with  . Hypertension  . Chest Pain      History of Present Illness: Katrina Young is a 80 y.o. female who presents for post hospital follow up. She was admitted at the end of February with chest pain. She states she was milking her goat when she developed pressure in her chest. She had difficulty breathing and couldn't walk far.  Troponin levels were in the 20s without significant change. CT showed no PE. Echo showed distal septal AK with EF 50-55%.  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 She has a medical history significant forbreast and ovarian cancer on anastrozole,chronic anxiety,essential hypertension, hyperlipidemia. She has been tracking her BP and it is staying high in the 778-242 systolic range. No recurrent chest pain or pressure.   Past Medical History:  Diagnosis Date  . Arthritis    legs, back. neck  . Breast cancer ([place])   . Breast cancer of lower-outer quadrant of left female breast ([place]) 12/11/2015  . Cancer ([place])    ovarian cancer (hysterectomy-12-14 years ago) no txs  . Complication of anesthesia   . Constipation   . Diminished eyesight change in eyesight   cataracts  . Family history of breast cancer   . Family history of colon cancer   . Family history of melanoma   . Family history of prostate cancer   . GERD (gastroesophageal reflux disease)   . 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 Hyperlipidemia   . Hypertension   . Joint pain   . Leg pain   . Personal history of radiation therapy   . PONV (postoperative nausea and vomiting)   . Reflux   . Shortness of breath dyspnea    increased walking or climbing stairs  . Varicose veins   . Weight gain     Past Surgical History:  Procedure Laterality Date  .  ABDOMINAL HYSTERECTOMY     total hx of ovarian cancer   . BLADDER SURGERY    . BREAST EXCISIONAL BIOPSY Right    over 20 years ago  . BREAST LUMPECTOMY Left 12/2015  . BREAST SURGERY  30-40 years   hx of removal of calcum build up -right breast   . CESAREAN SECTION  1966, 1968, 1973  . CHOLECYSTECTOMY    . colonscopy     . ENDOVENOUS ABLATION SAPHENOUS VEIN W/ LASER  12-30-2011   left greater saphenous vein  . EVLA  R  GSV  02-10-2011  . EVLA   RIGHT SMALL SAPHENOUS VEIN 09-13-2007  . LEFT HEART CATH AND CORONARY ANGIOGRAPHY N/A 09/22/2020   Procedure: LEFT HEART CATH AND CORONARY ANGIOGRAPHY;  Surgeon: Jettie Booze, MD;  Location: [facility];  Service: Cardiovascular;  Laterality: N/A;  . RADIOACTIVE SEED GUIDED PARTIAL MASTECTOMY WITH AXILLARY SENTINEL LYMPH NODE BIOPSY Left 01/22/2016   Procedure: LEFT BREAST RADIOACTIVE SEED GUIDED PARTIAL MASTECTOMY WITH AXILLARY SENTINEL LYMPH NODE BIOPSY;  Surgeon: Erroll Luna, MD;  Location: [facility];  Service: General;  Laterality: Left;  . SHOULDER SURGERY     bilat - rotaor cuff repair secondary to tear  . TONSILLECTOMY    . VENTRAL HERNIA REPAIR N/A 07/23/2014   Procedure: VENTRAL INCISIONAL HERNIA REPAIR WITH  MESH;  Surgeon: Armandina Gemma, MD;  Location: [facility];  Service: General;  Laterality: N/A;     Current Outpatient Medications  Medication Sig Dispense Refill  . anastrozole (ARIMIDEX) 1 MG tablet Take 1 tablet (1 mg total) by mouth daily. 90 tablet 3  . aspirin EC 81 MG tablet Take 81 mg by mouth daily.    Marland Kitchen atenolol (TENORMIN) 100 MG tablet Take 1 tablet (100 mg total) by mouth every morning.    . cholecalciferol (VITAMIN D) 1000 UNITS tablet Take 1,000 Units by mouth daily.    . cholecalciferol (VITAMIN D3) 25 MCG (1000 UNIT) tablet Take 1,000 Units by mouth daily.    Marland Kitchen esomeprazole (NEXIUM) 40 MG capsule Take 40 mg by mouth daily before breakfast.    . LORazepam (ATIVAN) 1 MG tablet Take 1 mg by  mouth daily.    Marland Kitchen losartan (COZAAR) 100 MG tablet Take 1 tablet (100 mg total) by mouth daily. 90 tablet 3  . Misc Natural Products (OSTEO BI-FLEX/5-LOXIN ADVANCED PO) Take by mouth.    . Multiple Vitamin (MULTIVITAMIN WITH MINERALS) TABS tablet Take 1 tablet by mouth daily.    . pravastatin (PRAVACHOL) 40 MG tablet Take 1 tablet (40 mg total) by mouth every evening. 90 tablet 3  . Probiotic Product (PROBIOTIC PO) Take 1 tablet by mouth once as needed (indigestion).    . Calcium-Vitamin D-Vitamin K (CALCIUM SOFT CHEWS PO) Take by mouth.    . Phenazopyridine HCl (AZO TABS PO) Take 2 tablets by mouth daily. Pt takes as needed when she has a flare-up     No current facility-administered medications for this visit.    Allergies:   Codeine and Morphine and related    Social History:  The patient  reports that she quit smoking about 54 years ago. Her smoking use included cigarettes. She quit after 2.00 years of use. She has never used smokeless tobacco. She reports that she does not drink alcohol and does not use drugs.   Family History:  The patient's family history includes Breast cancer in her cousin; Breast cancer (age of onset: 43) in her sister; Cancer in an other family member; Colon cancer (age of onset: 9) in her sister; Dementia in her cousin; Diabetes in her sister; Heart disease in her father, mother, and sister; Hypertension in her father; Leukemia (age of onset: 55) in her brother; Melanoma (age of onset: 55) in her brother; Melanoma (age of onset: 43) in her brother; Prostate cancer (age of onset: 58) in her brother; Prostate cancer (age of onset: 5) in her brother; Stroke in her brother.    ROS:  Please see the history of present illness.   Otherwise, review of systems are positive for none.   All other systems are reviewed and negative.    PHYSICAL EXAM: VS:  BP (!) 184/86 (BP Location: Right Arm, Patient Position: Sitting)   Pulse (!) 56   Ht 5' 3.5" (1.613 m)   Wt 167 lb 3.2  oz (75.8 kg)   SpO2 95%   BMI 29.15 kg/m  , BMI Body mass index is 29.15 kg/m. GEN: Well nourished, well developed, in no acute distress  HEENT: normal  Neck: no JVD, carotid bruits, or masses Cardiac: RRR; no murmurs, rubs, or gallops,no edema  Respiratory:  clear to auscultation bilaterally, normal work of breathing GI: soft, nontender, nondistended, + BS MS: no deformity or atrophy  Skin: warm and dry, no rash Neuro:  Strength and sensation are intact Psych:  euthymic mood, full affect   EKG:  EKG is not ordered today. The ekg ordered today demonstrates N/A   Recent Labs: 09/20/2020: B Natriuretic Peptide 93.4 09/22/2020: ALT 22; BUN 15; Creatinine, Ser 0.81; Hemoglobin 12.3; Magnesium 2.0; Platelets 176; Potassium 3.8; Sodium 141   Dated 09/25/20: normal CMET  Lipid Panel    Component Value Date/Time   CHOL 158 09/21/2020 0049   TRIG 149 09/21/2020 0049   HDL 55 09/21/2020 0049   CHOLHDL 2.9 09/21/2020 0049   VLDL 30 09/21/2020 0049   LDLCALC 73 09/21/2020 0049      Wt Readings from Last 3 Encounters:  10/30/20 167 lb 3.2 oz (75.8 kg)  09/22/20 165 lb (74.8 kg)  02/07/20 165 lb 3.2 oz (74.9 kg)      Other studies Reviewed: Additional studies/ records that were reviewed today include:   Echo 09/21/20: IMPRESSIONS    1. Akinesis of the distal septum with overall low normal LV function.  2. Left ventricular ejection fraction, by estimation, is 50 to 55%. The  left ventricle has low normal function. The left ventricle demonstrates  regional wall motion abnormalities (see scoring diagram/findings for  description). Left ventricular diastolic  parameters are consistent with Grade I diastolic dysfunction (impaired  relaxation).  3. Right ventricular systolic function is normal. The right ventricular  size is normal. There is moderately elevated pulmonary artery systolic  pressure.  4. Left atrial size was mildly dilated.  5. The mitral valve is normal in  structure. Mild mitral valve  regurgitation. No evidence of mitral stenosis.  6. The aortic valve is tricuspid. Aortic valve regurgitation is not  visualized. No aortic stenosis is present.  7. The inferior vena cava is normal in size with greater than 50%  respiratory variability, suggesting right atrial pressure of 3 mmHg.   Cardiac cath 09/22/20: LEFT HEART CATH AND CORONARY ANGIOGRAPHY    Conclusion    Mid RCA lesion is 25% stenosed.  Dist RCA lesion is 25% stenosed.  Mid Cx lesion is 10% stenosed.  Mid LAD lesion is 25% stenosed.  The left ventricular systolic function is normal.  LV end diastolic pressure is mildly elevated. LVEDP 17 mmHg.  The left ventricular ejection fraction is 50-55% by visual estimate.  There is no aortic valve stenosis.   Nonobstructive, mild coronary artery disease.  Tortuous right radial artery which makes navigation to the ascending aorta difficult and torquing catheters difficult.  If emergency heart cath was needed, would not use right radial approach.  P[place]ater interventional wire did help navigate the tortuosity in the right radial.  Continue medical therapy for HTN.       ASSESSMENT AND PLAN:  1.  Chest pressure. Extensive in hospital evaluation unremarkable. Cardiac cath and CT chest OK. Echo showed some septal wall motion abnormality that I think is related to her chronic LBBB. Focus on risk factor modification. 2. HTN not at goal. Will increase losartan to 100 mg daily. Continue atenolol. Follow up with PCP 3. Hypercholesterolemia. Goal LDL< 70. Will put her back on pravastatin 40 mg daily. Stop atorvastatin. 4. Chronic LBBB   Current medicines are reviewed at length with the patient today.  The patient does not have concerns regarding medicines.  The following changes have been made:  See above  Labs/ tests ordered today include:  No orders of the defined types were placed in this encounter.    Disposition:   FU with  me PRN   Signed, Quetzal Meany Martinique, MD  10/30/2020  [facility] [address], [place], [place], [address] Phone [phone], Fax [phone]

## 2020-10-30 ENCOUNTER — Ambulatory Visit (INDEPENDENT_AMBULATORY_CARE_PROVIDER_SITE_OTHER): Payer: Medicare Other | Admitting: Cardiology

## 2020-10-30 ENCOUNTER — Other Ambulatory Visit: Payer: Self-pay

## 2020-10-30 ENCOUNTER — Encounter: Payer: Self-pay | Admitting: Cardiology

## 2020-10-30 VITALS — BP 184/86 | HR 56 | Ht 63.5 in | Wt 167.2 lb

## 2020-10-30 DIAGNOSIS — I200 Unstable angina: Secondary | ICD-10-CM | POA: Diagnosis not present

## 2020-10-30 DIAGNOSIS — I10 Essential (primary) hypertension: Secondary | ICD-10-CM | POA: Diagnosis not present

## 2020-10-30 DIAGNOSIS — I700 Atherosclerosis of aorta: Secondary | ICD-10-CM

## 2020-10-30 DIAGNOSIS — I447 Left bundle-branch block, unspecified: Secondary | ICD-10-CM | POA: Diagnosis not present

## 2020-10-30 DIAGNOSIS — R079 Chest pain, unspecified: Secondary | ICD-10-CM | POA: Diagnosis not present

## 2020-10-30 DIAGNOSIS — E7800 Pure hypercholesterolemia, unspecified: Secondary | ICD-10-CM

## 2020-10-30 MED ORDER — PRAVASTATIN SODIUM 40 MG PO TABS: 40.0000 mg | ORAL_TABLET | Freq: Every evening | ORAL | 3 refills | Status: DC

## 2020-10-30 MED ORDER — LOSARTAN POTASSIUM 100 MG PO TABS: 100.0000 mg | ORAL_TABLET | Freq: Every day | ORAL | 3 refills | Status: DC

## 2020-10-30 NOTE — Patient Instructions (Signed)
Stop taking atorvastatin  We will resume pravastatin at 40 mg daily with goal LDL < 70  We will increase losartan to 100 mg daily

## 2020-11-04 DIAGNOSIS — I10 Essential (primary) hypertension: Secondary | ICD-10-CM | POA: Diagnosis not present

## 2020-11-19 DIAGNOSIS — L570 Actinic keratosis: Secondary | ICD-10-CM | POA: Diagnosis not present

## 2020-11-19 DIAGNOSIS — L821 Other seborrheic keratosis: Secondary | ICD-10-CM | POA: Diagnosis not present

## 2020-11-19 DIAGNOSIS — D229 Melanocytic nevi, unspecified: Secondary | ICD-10-CM | POA: Diagnosis not present

## 2020-11-19 DIAGNOSIS — L249 Irritant contact dermatitis, unspecified cause: Secondary | ICD-10-CM | POA: Diagnosis not present

## 2020-11-19 DIAGNOSIS — D1801 Hemangioma of skin and subcutaneous tissue: Secondary | ICD-10-CM | POA: Diagnosis not present

## 2021-02-05 NOTE — Progress Notes (Signed)
Patient Care Team: Katrina Battles, MD as PCP - General (Internal Medicine) Katrina Lose, MD as Consulting Physician (Oncology) Katrina Luna, MD as Consulting Physician (General Surgery) Katrina Silversmith, MD as Consulting Physician (Radiation Oncology)  DIAGNOSIS:    ICD-10-CM   1. Malignant neoplasm of lower-outer quadrant of left breast of female, estrogen receptor positive ([name])  C50.512    Z17.0       SUMMARY OF ONCOLOGIC HISTORY: Oncology History  Breast cancer of lower-outer quadrant of left female breast (Katrina Young)  12/11/2015 Initial Diagnosis   Left breast mass at 3:00: 1.7 x 1.6 x 1.2 cm, axilla negative, grade 2 IDC with DCIS, ER/PR positive HER-2 negative Ki-67 15%, T1 cN0 stage IA clinical stage    01/02/2016 Procedure   Genetic testing: VUS on POLD1 gene called c.3989G>A. Genes analyzed: APC, ATM, AXIN2, BAP1, BARD1, BMPR1A, BRCA1, BRCA2, BRIP1, CDH1, CDK4, CDKN2A, CHEK2, EPCAM, FANCC, MITF, MLH1, MSH2, MSH6, MUTYH, NBN, PALB2, PMS2, POLD1, POLE, PTEN, RAD51C, RAD51D, SCG5/GREM1, SMAD4, STK11, TP53, VHL, and XRCC2   01/22/2016 Surgery   Left lumpectomy (Katrina Young): IDC grade 1, 2.1 cm, with intermediate grade DCIS, LVI present, PNI present, 0/2 lymph nodes negative, ER 95%, PR 90%, HER-2 negative ratio 1.28, Ki-67 15%, T2 N0 stage II a, Oncotype 23, 15% ROR   01/22/2016 Oncotype testing   Recurrence score: 23; ROR 15% (intermediate risk)     03/11/2016 - 04/07/2016 Radiation Therapy   Adjuvant radiation therapy Katrina Young). Left breast: 42.5 Gy in 17 treatments. Left breast "boost": 7.5 Gy in 3 treatments.     04/2016 -  Anti-estrogen oral therapy   Anastrozole 1 mg daily. Planned duration of therapy: 5 years.       CHIEF COMPLIANT: Follow-up of left breast cancer on anastrozole  INTERVAL HISTORY: Katrina Young is a 80 y.o. with above-mentioned history of  left breast cancer treated with lumpectomy, radiation, and who is currently on anastrozole therapy. Mammogram on  10/17/20 showed no evidence of malignancy bilaterally. She presents to the clinic today for annual follow-up.  Over the past year her health has been fairly good with exception of one visit to the emergency room when she had trouble breathing.  She tells me that she was milking the goat and suddenly felt very short of breath and that they could not find any reason for it and since then she has not had any such problems.  Denies any lumps or nodules in the breast.  She does have tenderness at the surgical scars.  She is tolerating anastrozole extremely well without any problems or concerns.  She would like to stay on it indefinitely.  ALLERGIES:  is allergic to codeine and morphine and related.  MEDICATIONS:  Current Outpatient Medications  Medication Sig Dispense Refill   anastrozole (ARIMIDEX) 1 MG tablet Take 1 tablet (1 mg total) by mouth daily. 90 tablet 3   aspirin EC 81 MG tablet Take 81 mg by mouth daily.     atenolol (TENORMIN) 100 MG tablet Take 1 tablet (100 mg total) by mouth every morning.     Calcium-Vitamin D-Vitamin K (CALCIUM SOFT CHEWS PO) Take by mouth.     cholecalciferol (VITAMIN D) 1000 UNITS tablet Take 1,000 Units by mouth daily.     cholecalciferol (VITAMIN D3) 25 MCG (1000 UNIT) tablet Take 1,000 Units by mouth daily.     esomeprazole (NEXIUM) 40 MG capsule Take 40 mg by mouth daily before breakfast.     LORazepam (ATIVAN) 1 MG tablet Take 1  mg by mouth daily.     losartan (COZAAR) 100 MG tablet Take 1 tablet (100 mg total) by mouth daily. 90 tablet 3   Misc Natural Products (OSTEO BI-FLEX/5-LOXIN ADVANCED PO) Take by mouth.     Multiple Vitamin (MULTIVITAMIN WITH MINERALS) TABS tablet Take 1 tablet by mouth daily.     Phenazopyridine HCl (AZO TABS PO) Take 2 tablets by mouth daily. Pt takes as needed when she has a flare-up     pravastatin (PRAVACHOL) 40 MG tablet Take 1 tablet (40 mg total) by mouth every evening. 90 tablet 3   Probiotic Product (PROBIOTIC PO) Take 1  tablet by mouth once as needed (indigestion).     No current facility-administered medications for this visit.    PHYSICAL EXAMINATION: ECOG PERFORMANCE STATUS: 1 - Symptomatic but completely ambulatory  Vitals:   02/06/21 0953  BP: (!) 152/65  Pulse: (!) 52  Resp: 16  Temp: 97.7 F (36.5 C)  SpO2: 97%   Filed Weights   02/06/21 0953  Weight: 164 lb 1.6 oz (74.4 kg)    BREAST: No palpable masses or nodules in either right or left breasts. No palpable axillary supraclavicular or infraclavicular adenopathy no breast tenderness or nipple discharge. (exam performed in the presence of a chaperone)  LABORATORY DATA:  I have reviewed the data as listed CMP Latest Ref Rng & Units 09/22/2020 09/21/2020 09/20/2020  Glucose 70 - 99 mg/dL 114(H) 125(H) 132(H)  BUN 8 - 23 mg/dL _0 Creatinine 0.44 - 1.00 mg/dL 0.81 0.81 0.89  Sodium 135 - 145 mmol/L 141 142 140  Potassium 3.5 - 5.1 mmol/L 3.8 3.9 4.0  Chloride 98 - 111 mmol/L 107 107 105  CO2 22 - 32 mmol/L _1 Calcium 8.9 - 10.3 mg/dL 9.3 9.4 9.3  Total Protein 6.5 - 8.1 g/dL 5.9(L) - 6.6  Total Bilirubin 0.3 - 1.2 mg/dL 0.8 - 0.7  Alkaline Phos 38 - 126 U/L 100 - 116  AST 15 - 41 U/L 20 - 28  ALT 0 - 44 U/L 22 - 28    Lab Results  Component Value Date   WBC 7.8 09/22/2020   HGB 12.3 09/22/2020   HCT 34.8 (L) 09/22/2020   MCV 85.1 09/22/2020   PLT 176 09/22/2020   NEUTROABS 2.8 01/19/2016    ASSESSMENT & PLAN:  Breast cancer of lower-outer quadrant of left female breast ([name]) Left lumpectomy 01/22/2016: IDC grade 1, 2.1 cm, with intermediate grade DCIS, LVI present, PNI present, 0/2 lymph nodes negative, ER 95%, PR 90%, HER-2 negative ratio 1.28, Ki-67 15%, T2 N0 stage II a Oncotype DX score 23, 15% risk of recurrence   Adjuvant radiation therapy from 03/11/2016 to 04/07/2016   Treatment plan: Anastrozole 1 mg by mouth daily started 04/2016   Anastrozole Toxicities: Muscle cramping in the fingers has  improved   I discussed with her that we use anastrozole for 7 years.  She would like to stay on it longer than that.  We decided to let her continue beyond 7.   Surveillance: 1. Breast exam 02/06/2021: No evidence of malignancy. 2. Mammogram 10/17/2020: Benign breast density category C   Prior history of ovarian malignancy: Patient follows with her gynecologist for the surveillance with CA-125's.   Return to clinic in 1 year for follow-up    No orders of the defined types were placed in this encounter.  The patient has a good understanding of the overall plan. she agrees with it.  she will call with any problems that may develop before the next visit here.  Total time spent: 20 mins including face to face time and time spent for planning, charting and coordination of care  Rulon Eisenmenger, MD, MPH 02/06/2021  I, Thana Ates, am acting as scribe for Dr. Nicholas Young.  I have reviewed the above documentation for accuracy and completeness, and I agree with the above.

## 2021-02-06 ENCOUNTER — Inpatient Hospital Stay: Payer: Medicare Other | Attending: Hematology and Oncology | Admitting: Hematology and Oncology

## 2021-02-06 ENCOUNTER — Other Ambulatory Visit: Payer: Self-pay

## 2021-02-06 DIAGNOSIS — C50512 Malignant neoplasm of lower-outer quadrant of left female breast: Secondary | ICD-10-CM

## 2021-02-06 DIAGNOSIS — Z923 Personal history of irradiation: Secondary | ICD-10-CM | POA: Diagnosis not present

## 2021-02-06 DIAGNOSIS — Z7982 Long term (current) use of aspirin: Secondary | ICD-10-CM | POA: Insufficient documentation

## 2021-02-06 DIAGNOSIS — Z170 Estrogen receptor positive status [ER+]: Secondary | ICD-10-CM

## 2021-02-06 DIAGNOSIS — R0602 Shortness of breath: Secondary | ICD-10-CM | POA: Diagnosis not present

## 2021-02-06 DIAGNOSIS — Z79811 Long term (current) use of aromatase inhibitors: Secondary | ICD-10-CM | POA: Diagnosis not present

## 2021-02-06 DIAGNOSIS — Z79899 Other long term (current) drug therapy: Secondary | ICD-10-CM | POA: Insufficient documentation

## 2021-02-06 MED ORDER — ANASTROZOLE 1 MG PO TABS: 1.0000 mg | ORAL_TABLET | Freq: Every day | ORAL | 3 refills | Status: DC

## 2021-02-06 NOTE — Assessment & Plan Note (Signed)
Left lumpectomy 01/22/2016: IDC grade 1, 2.1 cm, with intermediate grade DCIS, LVI present, PNI present, 0/2 lymph nodes negative, ER 95%, PR 90%, HER-2 negative ratio 1.28, Ki-67 15%, T2 N0 stage II a Oncotype DX score 23, 15% risk of recurrence  Adjuvant radiation therapy from 03/11/2016 to 04/07/2016  Treatment plan: Anastrozole 1 mg by mouth dailystarted 04/2016  Anastrozole Toxicities: Muscle stiffness and cramping in the fingers along with trigger finger Pain in the fingers  I discussed with her about extending antiestrogen therapy for total of 7 years and she is agreeable to it.  She tells me that she would rather take it her whole life if it is going to help.  Surveillance: 1. Breast exam7/15/2022: Noevidence of malignancy. 2. Mammogram3/25/2022: Benign breast density category C  Prior history of ovarian malignancy: Patient follows with her gynecologist for the surveillance with CA-125's.  Return to clinic in1 yearfor follow-up

## 2021-05-02 DIAGNOSIS — Z23 Encounter for immunization: Secondary | ICD-10-CM | POA: Diagnosis not present

## 2021-05-19 DIAGNOSIS — L814 Other melanin hyperpigmentation: Secondary | ICD-10-CM | POA: Diagnosis not present

## 2021-05-19 DIAGNOSIS — L821 Other seborrheic keratosis: Secondary | ICD-10-CM | POA: Diagnosis not present

## 2021-05-19 DIAGNOSIS — D225 Melanocytic nevi of trunk: Secondary | ICD-10-CM | POA: Diagnosis not present

## 2021-05-19 DIAGNOSIS — D492 Neoplasm of unspecified behavior of bone, soft tissue, and skin: Secondary | ICD-10-CM | POA: Diagnosis not present

## 2021-05-19 DIAGNOSIS — L918 Other hypertrophic disorders of the skin: Secondary | ICD-10-CM | POA: Diagnosis not present

## 2021-05-19 DIAGNOSIS — C44622 Squamous cell carcinoma of skin of right upper limb, including shoulder: Secondary | ICD-10-CM | POA: Diagnosis not present

## 2021-05-19 DIAGNOSIS — L570 Actinic keratosis: Secondary | ICD-10-CM | POA: Diagnosis not present

## 2021-05-28 DIAGNOSIS — I10 Essential (primary) hypertension: Secondary | ICD-10-CM | POA: Diagnosis not present

## 2021-05-28 DIAGNOSIS — E785 Hyperlipidemia, unspecified: Secondary | ICD-10-CM | POA: Diagnosis not present

## 2021-05-29 DIAGNOSIS — H11442 Conjunctival cysts, left eye: Secondary | ICD-10-CM | POA: Diagnosis not present

## 2021-05-29 DIAGNOSIS — H5203 Hypermetropia, bilateral: Secondary | ICD-10-CM | POA: Diagnosis not present

## 2021-05-29 DIAGNOSIS — H43811 Vitreous degeneration, right eye: Secondary | ICD-10-CM | POA: Diagnosis not present

## 2021-05-29 DIAGNOSIS — H2513 Age-related nuclear cataract, bilateral: Secondary | ICD-10-CM | POA: Diagnosis not present

## 2021-06-04 DIAGNOSIS — I2510 Atherosclerotic heart disease of native coronary artery without angina pectoris: Secondary | ICD-10-CM | POA: Diagnosis not present

## 2021-06-04 DIAGNOSIS — I872 Venous insufficiency (chronic) (peripheral): Secondary | ICD-10-CM | POA: Diagnosis not present

## 2021-06-04 DIAGNOSIS — Z0000 Encounter for general adult medical examination without abnormal findings: Secondary | ICD-10-CM | POA: Diagnosis not present

## 2021-06-04 DIAGNOSIS — I10 Essential (primary) hypertension: Secondary | ICD-10-CM | POA: Diagnosis not present

## 2021-06-04 DIAGNOSIS — R82998 Other abnormal findings in urine: Secondary | ICD-10-CM | POA: Diagnosis not present

## 2021-06-04 DIAGNOSIS — Z853 Personal history of malignant neoplasm of breast: Secondary | ICD-10-CM | POA: Diagnosis not present

## 2021-06-04 DIAGNOSIS — K219 Gastro-esophageal reflux disease without esophagitis: Secondary | ICD-10-CM | POA: Diagnosis not present

## 2021-06-04 DIAGNOSIS — G4700 Insomnia, unspecified: Secondary | ICD-10-CM | POA: Diagnosis not present

## 2021-06-04 DIAGNOSIS — I700 Atherosclerosis of aorta: Secondary | ICD-10-CM | POA: Diagnosis not present

## 2021-06-04 DIAGNOSIS — M8589 Other specified disorders of bone density and structure, multiple sites: Secondary | ICD-10-CM | POA: Diagnosis not present

## 2021-06-04 DIAGNOSIS — E785 Hyperlipidemia, unspecified: Secondary | ICD-10-CM | POA: Diagnosis not present

## 2021-06-25 DIAGNOSIS — Z08 Encounter for follow-up examination after completed treatment for malignant neoplasm: Secondary | ICD-10-CM | POA: Diagnosis not present

## 2021-06-25 DIAGNOSIS — C44622 Squamous cell carcinoma of skin of right upper limb, including shoulder: Secondary | ICD-10-CM | POA: Diagnosis not present

## 2021-06-25 DIAGNOSIS — L905 Scar conditions and fibrosis of skin: Secondary | ICD-10-CM | POA: Diagnosis not present

## 2021-06-25 DIAGNOSIS — Z85828 Personal history of other malignant neoplasm of skin: Secondary | ICD-10-CM | POA: Diagnosis not present

## 2021-06-25 DIAGNOSIS — D492 Neoplasm of unspecified behavior of bone, soft tissue, and skin: Secondary | ICD-10-CM | POA: Diagnosis not present

## 2021-07-03 DIAGNOSIS — Z1152 Encounter for screening for COVID-19: Secondary | ICD-10-CM | POA: Diagnosis not present

## 2021-07-03 DIAGNOSIS — I2510 Atherosclerotic heart disease of native coronary artery without angina pectoris: Secondary | ICD-10-CM | POA: Diagnosis not present

## 2021-07-03 DIAGNOSIS — R051 Acute cough: Secondary | ICD-10-CM | POA: Diagnosis not present

## 2021-07-03 DIAGNOSIS — J029 Acute pharyngitis, unspecified: Secondary | ICD-10-CM | POA: Diagnosis not present

## 2021-07-03 DIAGNOSIS — U071 COVID-19: Secondary | ICD-10-CM | POA: Diagnosis not present

## 2021-07-03 DIAGNOSIS — R0981 Nasal congestion: Secondary | ICD-10-CM | POA: Diagnosis not present

## 2021-07-03 DIAGNOSIS — R5383 Other fatigue: Secondary | ICD-10-CM | POA: Diagnosis not present

## 2021-07-03 DIAGNOSIS — I10 Essential (primary) hypertension: Secondary | ICD-10-CM | POA: Diagnosis not present

## 2021-09-15 ENCOUNTER — Other Ambulatory Visit: Payer: Self-pay | Admitting: Hematology and Oncology

## 2021-09-15 DIAGNOSIS — Z1231 Encounter for screening mammogram for malignant neoplasm of breast: Secondary | ICD-10-CM

## 2021-09-23 DIAGNOSIS — Z6829 Body mass index (BMI) 29.0-29.9, adult: Secondary | ICD-10-CM | POA: Diagnosis not present

## 2021-09-23 DIAGNOSIS — Z1151 Encounter for screening for human papillomavirus (HPV): Secondary | ICD-10-CM | POA: Diagnosis not present

## 2021-09-23 DIAGNOSIS — Z124 Encounter for screening for malignant neoplasm of cervix: Secondary | ICD-10-CM | POA: Diagnosis not present

## 2021-10-12 ENCOUNTER — Other Ambulatory Visit: Payer: Self-pay | Admitting: Cardiology

## 2021-10-13 DIAGNOSIS — N390 Urinary tract infection, site not specified: Secondary | ICD-10-CM | POA: Diagnosis not present

## 2021-10-13 DIAGNOSIS — R300 Dysuria: Secondary | ICD-10-CM | POA: Diagnosis not present

## 2021-10-20 ENCOUNTER — Ambulatory Visit
Admission: RE | Admit: 2021-10-20 | Discharge: 2021-10-20 | Disposition: A | Discharge status: Home / Self Care | Payer: Medicare Other | Source: Ambulatory Visit | Attending: Hematology and Oncology | Admitting: Hematology and Oncology

## 2021-10-20 DIAGNOSIS — Z1231 Encounter for screening mammogram for malignant neoplasm of breast: Secondary | ICD-10-CM | POA: Diagnosis not present

## 2021-10-28 DIAGNOSIS — Z08 Encounter for follow-up examination after completed treatment for malignant neoplasm: Secondary | ICD-10-CM | POA: Diagnosis not present

## 2021-10-28 DIAGNOSIS — L814 Other melanin hyperpigmentation: Secondary | ICD-10-CM | POA: Diagnosis not present

## 2021-10-28 DIAGNOSIS — D225 Melanocytic nevi of trunk: Secondary | ICD-10-CM | POA: Diagnosis not present

## 2021-10-28 DIAGNOSIS — L570 Actinic keratosis: Secondary | ICD-10-CM | POA: Diagnosis not present

## 2021-10-28 DIAGNOSIS — Z85828 Personal history of other malignant neoplasm of skin: Secondary | ICD-10-CM | POA: Diagnosis not present

## 2021-10-28 DIAGNOSIS — L821 Other seborrheic keratosis: Secondary | ICD-10-CM | POA: Diagnosis not present

## 2021-12-01 ENCOUNTER — Other Ambulatory Visit: Payer: Self-pay | Admitting: Cardiology

## 2022-01-23 ENCOUNTER — Other Ambulatory Visit: Payer: Self-pay | Admitting: Hematology and Oncology

## 2022-01-27 NOTE — Progress Notes (Signed)
Patient Care Team: Garlan Fillers, MD as PCP - General (Internal Medicine) Serena Croissant, MD as Consulting Physician (Oncology) Harriette Bouillon, MD as Consulting Physician (General Surgery) Lurline Hare, MD as Consulting Physician (Radiation Oncology)  DIAGNOSIS:  Encounter Diagnoses  Name Primary?   Malignant neoplasm of lower-outer quadrant of left breast of female, estrogen receptor positive ([facility])    Iron deficiency anemia due to chronic blood loss     SUMMARY OF ONCOLOGIC HISTORY: Oncology History  Breast cancer of lower-outer quadrant of left female breast ([facility])  12/11/2015 Initial Diagnosis   Left breast mass at 3:00: 1.7 x 1.6 x 1.2 cm, axilla negative, grade 2 IDC with DCIS, ER/PR positive HER-2 negative Ki-67 15%, T1 cN0 stage IA clinical stage   01/02/2016 Procedure   Genetic testing: VUS on POLD1 gene called c.3989G>A. Genes analyzed: APC, ATM, AXIN2, BAP1, BARD1, BMPR1A, BRCA1, BRCA2, BRIP1, CDH1, CDK4, CDKN2A, CHEK2, EPCAM, FANCC, MITF, MLH1, MSH2, MSH6, MUTYH, NBN, PALB2, PMS2, POLD1, POLE, PTEN, RAD51C, RAD51D, SCG5/GREM1, SMAD4, STK11, TP53, VHL, and XRCC2   01/22/2016 Surgery   Left lumpectomy ([name]): IDC grade 1, 2.1 cm, with intermediate grade DCIS, LVI present, PNI present, 0/2 lymph nodes negative, ER 95%, PR 90%, HER-2 negative ratio 1.28, Ki-67 15%, T2 N0 stage II a, Oncotype 23, 15% ROR   01/22/2016 Oncotype testing   Recurrence score: 23; ROR 15% (intermediate risk)    03/11/2016 - 04/07/2016 Radiation Therapy   Adjuvant radiation therapy Mitzi Hansen). Left breast: 42.5 Gy in 17 treatments. Left breast "boost": 7.5 Gy in 3 treatments.    04/2016 -  Anti-estrogen oral therapy   Anastrozole 1 mg daily. Planned duration of therapy: 5 years.      CHIEF COMPLIANT:  Follow-up of left breast cancer on anastrozole    INTERVAL HISTORY: Katrina Young is a  81 y.o. with above-mentioned history of  left breast cancer treated with lumpectomy, radiation, and who is  currently on anastrozole therapy. She presents to the clinic today for an annual follow-up. She states that everything is well she tries to stay busy. Denies hot flashes. She has some discomfort in breast.   ALLERGIES:  is allergic to codeine and morphine and related.  MEDICATIONS:  Current Outpatient Medications  Medication Sig Dispense Refill   anastrozole (ARIMIDEX) 1 MG tablet TAKE 1 TABLET BY MOUTH EVERY DAY 90 tablet 3   aspirin EC 81 MG tablet Take 81 mg by mouth daily.     atenolol (TENORMIN) 100 MG tablet Take 1 tablet (100 mg total) by mouth every morning.     Calcium-Vitamin D-Vitamin K (CALCIUM SOFT CHEWS PO) Take by mouth.     cholecalciferol (VITAMIN D) 1000 UNITS tablet Take 1,000 Units by mouth daily.     cholecalciferol (VITAMIN D3) 25 MCG (1000 UNIT) tablet Take 1,000 Units by mouth daily.     esomeprazole (NEXIUM) 40 MG capsule Take 40 mg by mouth daily before breakfast.     LORazepam (ATIVAN) 1 MG tablet Take 1 mg by mouth daily.     losartan (COZAAR) 100 MG tablet TAKE 1 TABLET BY MOUTH EVERY DAY 90 tablet 3   Misc Natural Products (OSTEO BI-FLEX/5-LOXIN ADVANCED PO) Take by mouth.     Multiple Vitamin (MULTIVITAMIN WITH MINERALS) TABS tablet Take 1 tablet by mouth daily.     Phenazopyridine HCl (AZO TABS PO) Take 2 tablets by mouth daily. Pt takes as needed when she has a flare-up     pravastatin (PRAVACHOL) 40 MG tablet  TAKE 1 TABLET BY MOUTH EVERY DAY IN THE EVENING 90 tablet 3   Probiotic Product (PROBIOTIC PO) Take 1 tablet by mouth once as needed (indigestion).     No current facility-administered medications for this visit.    PHYSICAL EXAMINATION: ECOG PERFORMANCE STATUS: 1 - Symptomatic but completely ambulatory  Vitals:   02/09/22 1021  BP: (!) 154/71  Pulse: 80  Resp: 18  Temp: (!) 97.5 F (36.4 C)  SpO2: 97%   Filed Weights   02/09/22 1021  Weight: 165 lb 8 oz (75.1 kg)    BREAST: No palpable masses or nodules in either right or left  breasts. No palpable axillary supraclavicular or infraclavicular adenopathy no breast tenderness or nipple discharge. (exam performed in the presence of a chaperone)  LABORATORY DATA:  I have reviewed the data as listed    Latest Ref Rng & Units 09/22/2020    4:27 AM 09/21/2020   12:49 AM 09/20/2020    7:05 PM  CMP  Glucose 70 - 99 mg/dL 114  125  132   BUN 8 - 23 mg/dL $Remove'15  15  17   'GYPNYre$ Creatinine 0.44 - 1.00 mg/dL 0.81  0.81  0.89   Sodium 135 - 145 mmol/L 141  142  140   Potassium 3.5 - 5.1 mmol/L 3.8  3.9  4.0   Chloride 98 - 111 mmol/L 107  107  105   CO2 22 - 32 mmol/L $RemoveB'24  25  23   'DqofEMXL$ Calcium 8.9 - 10.3 mg/dL 9.3  9.4  9.3   Total Protein 6.5 - 8.1 g/dL 5.9   6.6   Total Bilirubin 0.3 - 1.2 mg/dL 0.8   0.7   Alkaline Phos 38 - 126 U/L 100   116   AST 15 - 41 U/L 20   28   ALT 0 - 44 U/L 22   28     Lab Results  Component Value Date   WBC 7.8 09/22/2020   HGB 12.3 09/22/2020   HCT 34.8 (L) 09/22/2020   MCV 85.1 09/22/2020   PLT 176 09/22/2020   NEUTROABS 2.8 01/19/2016    ASSESSMENT & PLAN:  Breast cancer of lower-outer quadrant of left female breast ([place]) Left lumpectomy 01/22/2016: IDC grade 1, 2.1 cm, with intermediate grade DCIS, LVI present, PNI present, 0/2 lymph nodes negative, ER 95%, PR 90%, HER-2 negative ratio 1.28, Ki-67 15%, T2 N0 stage II a Oncotype DX score 23, 15% risk of recurrence   Adjuvant radiation therapy from 03/11/2016 to 04/07/2016   Treatment plan: Anastrozole 1 mg by mouth daily started 04/2016   Anastrozole Toxicities: Muscle cramping in the fingers has improved Patient plans to continue anastrozole beyond 7 years.   Surveillance: 1. Breast exam 02/09/2022: No evidence of malignancy. 2. Mammogram 10/20/2021: Benign breast density category C  Her niece Amere Iott is also a patient of mine. Return to clinic in 1 year for follow-up    No orders of the defined types were placed in this encounter.  The patient has a good understanding of the  overall plan. she agrees with it. she will call with any problems that may develop before the next visit here. Total time spent: 30 mins including face to face time and time spent for planning, charting and co-ordination of care   Harriette Ohara, MD 02/09/22    I Gardiner Coins am scribing for Dr. Lindi Adie  I have reviewed the above documentation for accuracy and completeness, and I agree with the  above.

## 2022-02-09 ENCOUNTER — Other Ambulatory Visit: Payer: Self-pay

## 2022-02-09 ENCOUNTER — Inpatient Hospital Stay: Payer: Medicare Other | Attending: Hematology and Oncology | Admitting: Hematology and Oncology

## 2022-02-09 DIAGNOSIS — Z79811 Long term (current) use of aromatase inhibitors: Secondary | ICD-10-CM | POA: Insufficient documentation

## 2022-02-09 DIAGNOSIS — Z7982 Long term (current) use of aspirin: Secondary | ICD-10-CM | POA: Insufficient documentation

## 2022-02-09 DIAGNOSIS — Z923 Personal history of irradiation: Secondary | ICD-10-CM | POA: Diagnosis not present

## 2022-02-09 DIAGNOSIS — Z170 Estrogen receptor positive status [ER+]: Secondary | ICD-10-CM | POA: Diagnosis not present

## 2022-02-09 DIAGNOSIS — C50512 Malignant neoplasm of lower-outer quadrant of left female breast: Secondary | ICD-10-CM

## 2022-02-09 DIAGNOSIS — D500 Iron deficiency anemia secondary to blood loss (chronic): Secondary | ICD-10-CM | POA: Insufficient documentation

## 2022-02-09 DIAGNOSIS — Z79899 Other long term (current) drug therapy: Secondary | ICD-10-CM | POA: Diagnosis not present

## 2022-02-09 NOTE — Assessment & Plan Note (Addendum)
Left lumpectomy 01/22/2016: IDC grade 1, 2.1 cm, with intermediate grade DCIS, LVI present, PNI present, 0/2 lymph nodes negative, ER 95%, PR 90%, HER-2 negative ratio 1.28, Ki-67 15%, T2 N0 stage II a Oncotype DX score 23, 15% risk of recurrence  Adjuvant radiation therapy from 03/11/2016 to 04/07/2016  Treatment plan: Anastrozole 1 mg by mouth dailystarted 04/2016  Anastrozole Toxicities: Muscle cramping in the fingers has improved Patient plans to continue anastrozole beyond 7 years.  Surveillance: 1. Breast exam7/18/2023: Noevidence of malignancy. 2. Mammogram3/28/2023: Benign breast density category C  Her niece Tramaine Snell is also a patient of mine. Return to clinic in 1 year for follow-up

## 2022-03-03 ENCOUNTER — Telehealth: Payer: Self-pay | Admitting: Cardiology

## 2022-03-03 DIAGNOSIS — I10 Essential (primary) hypertension: Secondary | ICD-10-CM

## 2022-03-03 NOTE — Telephone Encounter (Signed)
Pt c/o BP issue: STAT if pt c/o blurred vision, one-sided weakness or slurred speech  1. What are your last 5 BP readings?  8/09: 185/90 today, 164/79, 178/89, 176/80  2. Are you having any other symptoms (ex. Dizziness, headache, blurred vision, passed out)?  Right sided CP, low grade for about 2 weeks coming and going   3. What is your BP issue?   Selinda Eon states patient's systolic BP has been in the 170-180's the past few days.

## 2022-03-03 NOTE — Telephone Encounter (Signed)
Patient reports BP increasing over past 2 weeks, ranging form  164/79 to 178/89. Last night and this morning SBP 180. She reports HA on and off on left side and Chest pain 4/10 on rt chest that comes and goes. Denies N-V, SOB. Watches Na+ and caffeine intake. Takes meds as ordered. Please advise on BP.

## 2022-03-04 MED ORDER — CHLORTHALIDONE 25 MG PO TABS: 25.0000 mg | ORAL_TABLET | Freq: Every day | ORAL | 3 refills | Status: DC

## 2022-03-04 NOTE — Telephone Encounter (Signed)
Spoke to patient she stated she has been having chest pressure,heaviness off and on for the past 2 weeks.No chest pressure at present.Stated she has noticed elevated B/P.Readings listed below.B/P this morning 185/90 pulse 58.She also is having weakness in both legs.Appointment scheduled with Almyra Deforest PA Mon 8/14 at 8:50 am.  Spoke to patient Dr.Jordan advised to start Chlorthalidone 25 mg daily.Bmet in 2 weeks.Lab order mailed.Advised to keep appointment already scheduled with Almyra Deforest PA 8/14 at 8:50 am.Advised to continue to monitor B/P daily and bring readings to appointment.

## 2022-03-08 ENCOUNTER — Encounter: Payer: Self-pay | Admitting: Physician Assistant

## 2022-03-08 ENCOUNTER — Ambulatory Visit (INDEPENDENT_AMBULATORY_CARE_PROVIDER_SITE_OTHER): Payer: Medicare Other | Admitting: Physician Assistant

## 2022-03-08 VITALS — BP 132/70 | HR 57 | Ht 63.5 in | Wt 162.0 lb

## 2022-03-08 DIAGNOSIS — R072 Precordial pain: Secondary | ICD-10-CM

## 2022-03-08 DIAGNOSIS — I2510 Atherosclerotic heart disease of native coronary artery without angina pectoris: Secondary | ICD-10-CM

## 2022-03-08 DIAGNOSIS — Z79899 Other long term (current) drug therapy: Secondary | ICD-10-CM

## 2022-03-08 DIAGNOSIS — I10 Essential (primary) hypertension: Secondary | ICD-10-CM

## 2022-03-08 DIAGNOSIS — E785 Hyperlipidemia, unspecified: Secondary | ICD-10-CM | POA: Diagnosis not present

## 2022-03-08 MED ORDER — METOPROLOL TARTRATE 25 MG PO TABS: ORAL_TABLET | ORAL | 0 refills | Status: DC

## 2022-03-08 NOTE — Patient Instructions (Addendum)
Medication Instructions:  TAKE Metoprolol Tartrate (Lopressor) 25 mg tablet 2 hours before Coronary CT HOLD Chlorthalidone the morning of Coronary CT *If you need a refill on your cardiac medications before your next appointment, please call your pharmacy*  Lab Work: Your physician recommends that you return for lab work in 1 week:  BMP If you have labs (blood work) drawn today and your tests are completely normal, you will receive your results only by: [place] (if you have MyChart) OR A paper copy in the mail If you have any lab test that is abnormal or we need to change your treatment, we will call you to review the results.  Testing/Procedures: Your physician has requested that you have cardiac CT. Cardiac computed tomography (CT) is a painless test that uses an x-ray machine to take clear, detailed pictures of your heart.  Please follow instruction sheet as given.  Follow-Up: At [facility], you and your health needs are our priority.  As part of our continuing mission to provide you with exceptional heart care, we have created designated Provider Care Teams.  These Care Teams include your primary Cardiologist (physician) and Advanced Practice Providers (APPs -  Physician Assistants and Nurse Practitioners) who all work together to provide you with the care you need, when you need it.  We recommend signing up for the patient portal called "MyChart".  Sign up information is provided on this After Visit Summary.  MyChart is used to connect with patients for Virtual Visits (Telemedicine).  Patients are able to view lab/test results, encounter notes, upcoming appointments, etc.  Non-urgent messages can be sent to your provider as well.   To learn more about what you can do with MyChart, go to [email or web address].    Your next appointment:   4 month(s)  The format for your next appointment:   In Person  Provider:   Peter Martinique, MD     Other Instructions   Your  cardiac CT will be scheduled at one of the below locations:   [facility] [address] [place], [place] [address] [phone]  [place] [address] [place], [place] [address] [phone]  If scheduled at [facility], please arrive at the [facility] and Children's Entrance (Entrance C2) of [facility] 30 minutes prior to test start time. You can use the FREE valet parking offered at entrance C (encouraged to control the heart rate for the test)  Proceed to the [facility] Radiology Department (first floor) to check-in and test prep.  All radiology patients and guests should use entrance C2 at [facility], accessed from [facility], even though the hospital's physical address listed is [address].    If scheduled at [facility], please arrive 15 mins early for check-in and test prep.  Please follow these instructions carefully (unless otherwise directed):  On the Night Before the Test: Be sure to Drink plenty of water. Do not consume any caffeinated/decaffeinated beverages or chocolate 12 hours prior to your test. Do not take any antihistamines 12 hours prior to your test. If the patient has contrast allergy: Patient will need a prescription for Prednisone and very clear instructions (as follows): Prednisone 50 mg - take 13 hours prior to test Take another Prednisone 50 mg 7 hours prior to test Take another Prednisone 50 mg 1 hour prior to test Take Benadryl 50 mg 1 hour prior to test Patient must  complete all four doses of above prophylactic medications. Patient will need a ride after test due to Benadryl.  On the Day of the Test: Drink plenty of water until 1 hour prior to the test. Do not eat any food 4 hours prior to the test. You may take your regular medications prior to the test.  Take metoprolol (Lopressor) two hours prior to  test. HOLD Chlorthalidone morning of the test. FEMALES- please wear underwire-free bra if available, avoid dresses & tight clothing  After the Test: Drink plenty of water. After receiving IV contrast, you may experience a mild flushed feeling. This is normal. On occasion, you may experience a mild rash up to 24 hours after the test. This is not dangerous. If this occurs, you can take Benadryl 25 mg and increase your fluid intake. If you experience trouble breathing, this can be serious. If it is severe call 911 IMMEDIATELY. If it is mild, please call our office. If you take any of these medications: Glipizide/Metformin, Avandament, Glucavance, please do not take 48 hours after completing test unless otherwise instructed.  We will call to schedule your test 2-4 weeks out understanding that some insurance companies will need an authorization prior to the service being performed.   For non-scheduling related questions, please contact the cardiac imaging nurse navigator should you have any questions/concerns: Marchia Bond, Cardiac Imaging Nurse Navigator Gordy Clement, Cardiac Imaging Nurse Navigator [facility] Direct Office Dial: [phone]   For scheduling needs, including cancellations and rescheduling, please call [place], [phone].   Important Information About Sugar

## 2022-03-08 NOTE — Progress Notes (Unsigned)
Cardiology Office Note:    Date:  03/10/2022   ID:  Katrina Young, DOB 01/20/1941, MRN [id number]  PCP:  Donnajean Lopes, MD   [place] [facility] Cardiologist:  Peter Martinique, MD     Referring MD: Donnajean Lopes, MD   Chief Complaint  Patient presents with   Follow-up    Seen for Dr. Martinique    History of Present Illness:    Katrina Young is a 81 y.o. female with a hx of hypertension, hyperlipidemia, and a history of varicose veins.  Patient was initially admitted to the hospital in February 2022 with chest pain.  CTA showed no PE.  Echocardiogram showed distal septal akinesis with EF 50 to 55%.  Cardiac catheterization showed nonobstructive CAD.  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  She has history of breast and ovarian cancer anastrozole.  She also has chronic anxiety.  Patient was last seen in the office by Dr. Martinique in April 2022.  Follow-up as needed was recommended.  Patient presents today for evaluation of intermittent substernal chest discomfort radiating to the right chest for the past 2 weeks.  She called cardiology service last week at the reported high blood pressure, she was started on chlorthalidone 25 mg daily.  Blood pressure has since came down to 110s to 130s range.  Manual recheck of the blood pressure by myself was 132/70 today.  She says she noticed the chest discomfort was more exertional for the past 2 weeks, symptoms may also occur at rest as well.  She has a back pain near the mid to lower thoracic area.  Radial pulses equal bilaterally, suspicion for aortic dissection are fairly low.  She says the chest pain can last hours at a time.  She has typical and atypical features with chest pain, I recommend a coronary CT to further assess.  She is due for basic metabolic panel next Tuesday anyway after the start of the chlorthalidone.   Past Medical History:  Diagnosis Date   Arthritis    legs, back. neck   Breast cancer ([facility])    Breast cancer of  lower-outer quadrant of left female breast ([place]) 12/11/2015   Cancer ([place])    ovarian cancer (hysterectomy-12-14 years ago) no txs   Complication of anesthesia    Constipation    Diminished eyesight change in eyesight   cataracts   Family history of breast cancer    Family history of colon cancer    Family history of melanoma    Family history of prostate cancer    GERD (gastroesophageal reflux disease)    Hearing difficulty change in hearing   Hyperlipidemia    Hypertension    Joint pain    Leg pain    Personal history of radiation therapy    PONV (postoperative nausea and vomiting)    Reflux    Shortness of breath dyspnea    increased walking or climbing stairs   Varicose veins    Weight gain     Past Surgical History:  Procedure Laterality Date   ABDOMINAL HYSTERECTOMY     total hx of ovarian cancer    BLADDER SURGERY     BREAST BIOPSY Left 12/09/2015   BREAST EXCISIONAL BIOPSY Right    over 20 years ago   BREAST LUMPECTOMY Left 12/2015   BREAST SURGERY  30-40 years   hx of removal of calcum build up -right breast    [place]  colonscopy      ENDOVENOUS ABLATION SAPHENOUS VEIN W/ LASER  12/30/2011   left greater saphenous vein   EVLA  R  GSV  02-10-2011   EVLA   RIGHT SMALL SAPHENOUS VEIN 09-13-2007   LEFT HEART CATH AND CORONARY ANGIOGRAPHY N/A 09/22/2020   Procedure: LEFT HEART CATH AND CORONARY ANGIOGRAPHY;  Surgeon: Jettie Booze, MD;  Location: [facility];  Service: Cardiovascular;  Laterality: N/A;   RADIOACTIVE SEED GUIDED PARTIAL MASTECTOMY WITH AXILLARY SENTINEL LYMPH NODE BIOPSY Left 01/22/2016   Procedure: LEFT BREAST RADIOACTIVE SEED GUIDED PARTIAL MASTECTOMY WITH AXILLARY SENTINEL LYMPH NODE BIOPSY;  Surgeon: Erroll Luna, MD;  Location: [facility];  Service: General;  Laterality: Left;   SHOULDER SURGERY     bilat - rotaor cuff repair secondary to tear   TONSILLECTOMY      VENTRAL HERNIA REPAIR N/A 07/23/2014   Procedure: VENTRAL INCISIONAL HERNIA REPAIR WITH MESH;  Surgeon: Armandina Gemma, MD;  Location: [facility];  Service: General;  Laterality: N/A;    Current Medications: Current Meds  Medication Sig   anastrozole (ARIMIDEX) 1 MG tablet TAKE 1 TABLET BY MOUTH EVERY DAY   aspirin EC 81 MG tablet Take 81 mg by mouth daily.   atenolol (TENORMIN) 100 MG tablet Take 1 tablet (100 mg total) by mouth every morning.   Calcium-Vitamin D-Vitamin K (CALCIUM SOFT CHEWS PO) Take by mouth.   chlorthalidone (HYGROTON) 25 MG tablet Take 1 tablet (25 mg total) by mouth daily.   cholecalciferol (VITAMIN D) 1000 UNITS tablet Take 1,000 Units by mouth daily.   cholecalciferol (VITAMIN D3) 25 MCG (1000 UNIT) tablet Take 1,000 Units by mouth daily.   esomeprazole (NEXIUM) 40 MG capsule Take 40 mg by mouth daily before breakfast.   LORazepam (ATIVAN) 1 MG tablet Take 1 mg by mouth daily.   losartan (COZAAR) 100 MG tablet TAKE 1 TABLET BY MOUTH EVERY DAY   metoprolol tartrate (LOPRESSOR) 25 MG tablet Take tablet by mouth 2 hours before Coronary CT   Misc Natural Products (OSTEO BI-FLEX/5-LOXIN ADVANCED PO) Take by mouth.   Multiple Vitamin (MULTIVITAMIN WITH MINERALS) TABS tablet Take 1 tablet by mouth daily.   Phenazopyridine HCl (AZO TABS PO) Take 2 tablets by mouth daily. Pt takes as needed when she has a flare-up   pravastatin (PRAVACHOL) 40 MG tablet TAKE 1 TABLET BY MOUTH EVERY DAY IN THE EVENING   Probiotic Product (PROBIOTIC PO) Take 1 tablet by mouth once as needed (indigestion).     Allergies:   Codeine and Morphine and related   Social History   Socioeconomic History   Marital status: Married    Spouse name: Sonia Side   Number of children: 2   Years of education: Not on file   Highest education level: Not on file  Occupational History   Not on file  Tobacco Use   Smoking status: Former    Years: 2.00    Types: Cigarettes    Quit date: 07/26/1966    Years since  quitting: 55.6   Smokeless tobacco: Never  Substance and Sexual Activity   Alcohol use: No   Drug use: No   Sexual activity: Not on file  Other Topics Concern   Not on file  Social History Narrative   Not on file   Social Determinants of Health   Financial Resource Strain: Not on file  Food Insecurity: Not on file  Transportation Needs: Not on file  Physical Activity: Not on file  Stress: Not on file  Social Connections: Not on file     Family History: The patient's family history includes Breast cancer in her cousin; Breast cancer (age of onset: 5) in her sister; Cancer in an other family member; Colon cancer (age of onset: 18) in her sister; Dementia in her cousin; Diabetes in her sister; Heart disease in her father, mother, and sister; Hypertension in her father; Leukemia (age of onset: 67) in her brother; Melanoma (age of onset: 47) in her brother; Melanoma (age of onset: 11) in her brother; Prostate cancer (age of onset: 52) in her brother; Prostate cancer (age of onset: 66) in her brother; Stroke in her brother.  ROS:   Please see the history of present illness.    All other systems reviewed and are negative.  EKGs/Labs/Other Studies Reviewed:    The following studies were reviewed today:  Cath 09/22/2020 Mid RCA lesion is 25% stenosed. Dist RCA lesion is 25% stenosed. Mid Cx lesion is 10% stenosed. Mid LAD lesion is 25% stenosed. The left ventricular systolic function is normal. LV end diastolic pressure is mildly elevated. LVEDP 17 mmHg. The left ventricular ejection fraction is 50-55% by visual estimate. There is no aortic valve stenosis.   Nonobstructive, mild coronary artery disease.  Tortuous right radial artery which makes navigation to the ascending aorta difficult and torquing catheters difficult.  If emergency heart cath was needed, would not use right radial approach.  Prowater interventional wire did help navigate the tortuosity in the right radial.    Continue medical therapy for HTN.    EKG:  EKG is ordered today.  The ekg ordered today demonstrates normal sinus rhythm, left bundle branch block.  Recent Labs: No results found for requested labs within last 365 days.  Recent Lipid Panel    Component Value Date/Time   CHOL 158 09/21/2020 0049   TRIG 149 09/21/2020 0049   HDL 55 09/21/2020 0049   CHOLHDL 2.9 09/21/2020 0049   VLDL 30 09/21/2020 0049   LDLCALC 73 09/21/2020 0049     Risk Assessment/Calculations:           Physical Exam:    VS:  BP 132/70   Pulse (!) 57   Ht 5' 3.5" (1.613 m)   Wt 162 lb (73.5 kg)   SpO2 97%   BMI 28.25 kg/m        Wt Readings from Last 3 Encounters:  03/08/22 162 lb (73.5 kg)  02/09/22 165 lb 8 oz (75.1 kg)  02/06/21 164 lb 1.6 oz (74.4 kg)     GEN:  Well nourished, well developed in no acute distress HEENT: Normal NECK: No JVD; No carotid bruits LYMPHATICS: No lymphadenopathy CARDIAC: RRR, no murmurs, rubs, gallops RESPIRATORY:  Clear to auscultation without rales, wheezing or rhonchi  ABDOMEN: Soft, non-tender, non-distended MUSCULOSKELETAL:  No edema; No deformity  SKIN: Warm and dry NEUROLOGIC:  Alert and oriented x 3 PSYCHIATRIC:  Normal affect   ASSESSMENT:    1. Precordial pain   2. Medication management   3. Coronary artery disease involving native coronary artery of native heart without angina pectoris   4. Primary hypertension   5. Hyperlipidemia LDL goal <70    PLAN:    In order of problems listed above:  Chest pain: Proceed with coronary CT for further assessment patient will take a single dose of 25 mg metoprolol 2 hours prior to the coronary CT.  CAD: Previous cardiac catheterization in February 2022 showed mild nonobstructive disease.  Hypertension: Blood pressure stable  Hyperlipidemia: On pravastatin           Medication Adjustments/Labs and Tests Ordered: Current medicines are reviewed at length with the patient today.  Concerns  regarding medicines are outlined above.  Orders Placed This Encounter  Procedures   CT CORONARY MORPH W/CTA COR W/SCORE W/CA W/CM &/OR WO/CM   Basic metabolic panel   EKG 56-EPPI   Meds ordered this encounter  Medications   metoprolol tartrate (LOPRESSOR) 25 MG tablet    Sig: Take tablet by mouth 2 hours before Coronary CT    Dispense:  1 tablet    Refill:  0    Patient Instructions  Medication Instructions:  TAKE Metoprolol Tartrate (Lopressor) 25 mg tablet 2 hours before Coronary CT HOLD Chlorthalidone the morning of Coronary CT *If you need a refill on your cardiac medications before your next appointment, please call your pharmacy*  Lab Work: Your physician recommends that you return for lab work in 1 week:  BMP If you have labs (blood work) drawn today and your tests are completely normal, you will receive your results only by: MyChart Message (if you have MyChart) OR A paper copy in the mail If you have any lab test that is abnormal or we need to change your treatment, we will call you to review the results.  Testing/Procedures: Your physician has requested that you have cardiac CT. Cardiac computed tomography (CT) is a painless test that uses an x-ray machine to take clear, detailed pictures of your heart.  Please follow instruction sheet as given.  Follow-Up: At [facility], you and your health needs are our priority.  As part of our continuing mission to provide you with exceptional heart care, we have created designated Provider Care Teams.  These Care Teams include your primary Cardiologist (physician) and Advanced Practice Providers (APPs -  Physician Assistants and Nurse Practitioners) who all work together to provide you with the care you need, when you need it.  We recommend signing up for the patient portal called "MyChart".  Sign up information is provided on this After Visit Summary.  MyChart is used to connect with patients for Virtual Visits (Telemedicine).   Patients are able to view lab/test results, encounter notes, upcoming appointments, etc.  Non-urgent messages can be sent to your provider as well.   To learn more about what you can do with MyChart, go to [email or web address].    Your next appointment:   4 month(s)  The format for your next appointment:   In Person  Provider:   Peter Martinique, MD     Other Instructions   Your cardiac CT will be scheduled at one of the below locations:   [facility] [address] [place], [place] [address] [phone]  [place] [address] [place], [place] [address] [phone]  If scheduled at [facility], please arrive at the [facility] and Children's Entrance (Entrance C2) of [facility] 30 minutes prior to test start time. You can use the FREE valet parking offered at entrance C (encouraged to control the heart rate for the test)  Proceed to the [facility] Radiology Department (first floor) to check-in and test prep.  All radiology patients and guests should use entrance C2 at [facility], accessed from [facility], even though the hospital's physical address listed is [address].    If scheduled at [facility], please  arrive 15 mins early for check-in and test prep.  Please follow these instructions carefully (unless otherwise directed):  On the Night Before the Test: Be sure to Drink plenty of water. Do not consume any caffeinated/decaffeinated beverages or chocolate 12 hours prior to your test. Do not take any antihistamines 12 hours prior to your test. If the patient has contrast allergy: Patient will need a prescription for Prednisone and very clear instructions (as follows): Prednisone 50 mg - take 13 hours prior to test Take another Prednisone 50 mg 7 hours prior to test Take another Prednisone 50 mg 1 hour prior to test Take  Benadryl 50 mg 1 hour prior to test Patient must complete all four doses of above prophylactic medications. Patient will need a ride after test due to Benadryl.  On the Day of the Test: Drink plenty of water until 1 hour prior to the test. Do not eat any food 4 hours prior to the test. You may take your regular medications prior to the test.  Take metoprolol (Lopressor) two hours prior to test. HOLD Chlorthalidone morning of the test. FEMALES- please wear underwire-free bra if available, avoid dresses & tight clothing  After the Test: Drink plenty of water. After receiving IV contrast, you may experience a mild flushed feeling. This is normal. On occasion, you may experience a mild rash up to 24 hours after the test. This is not dangerous. If this occurs, you can take Benadryl 25 mg and increase your fluid intake. If you experience trouble breathing, this can be serious. If it is severe call 911 IMMEDIATELY. If it is mild, please call our office. If you take any of these medications: Glipizide/Metformin, Avandament, Glucavance, please do not take 48 hours after completing test unless otherwise instructed.  We will call to schedule your test 2-4 weeks out understanding that some insurance companies will need an authorization prior to the service being performed.   For non-scheduling related questions, please contact the cardiac imaging nurse navigator should you have any questions/concerns: Marchia Bond, Cardiac Imaging Nurse Navigator Gordy Clement, Cardiac Imaging Nurse Navigator [facility] Direct Office Dial: [phone]   For scheduling needs, including cancellations and rescheduling, please call [place], [phone].   Important Information About Sugar         Hilbert Corrigan, [place]  03/10/2022 11:05 PM    [place]

## 2022-03-10 ENCOUNTER — Encounter: Payer: Self-pay | Admitting: Physician Assistant

## 2022-03-15 DIAGNOSIS — Z79899 Other long term (current) drug therapy: Secondary | ICD-10-CM | POA: Diagnosis not present

## 2022-03-15 LAB — BASIC METABOLIC PANEL
BUN/Creatinine Ratio: 27 (ref 12–28)
BUN: 25 mg/dL (ref 8–27)
CO2: 29 mmol/L (ref 20–29)
Calcium: 10.3 mg/dL (ref 8.7–10.3)
Chloride: 99 mmol/L (ref 96–106)
Creatinine, Ser: 0.91 mg/dL (ref 0.57–1.00)
Glucose: 94 mg/dL (ref 70–99)
Potassium: 4.6 mmol/L (ref 3.5–5.2)
Sodium: 138 mmol/L (ref 134–144)
eGFR: 63 mL/min/{1.73_m2} (ref >59)

## 2022-03-18 IMAGING — DX DG CHEST 1V PORT
1 series · 1 of 1 positions shown · non-contrast
Comparison: July 15, 2014

CLINICAL DATA: Chest pain

EXAM:
PORTABLE CHEST 1 VIEW

[chest ap]
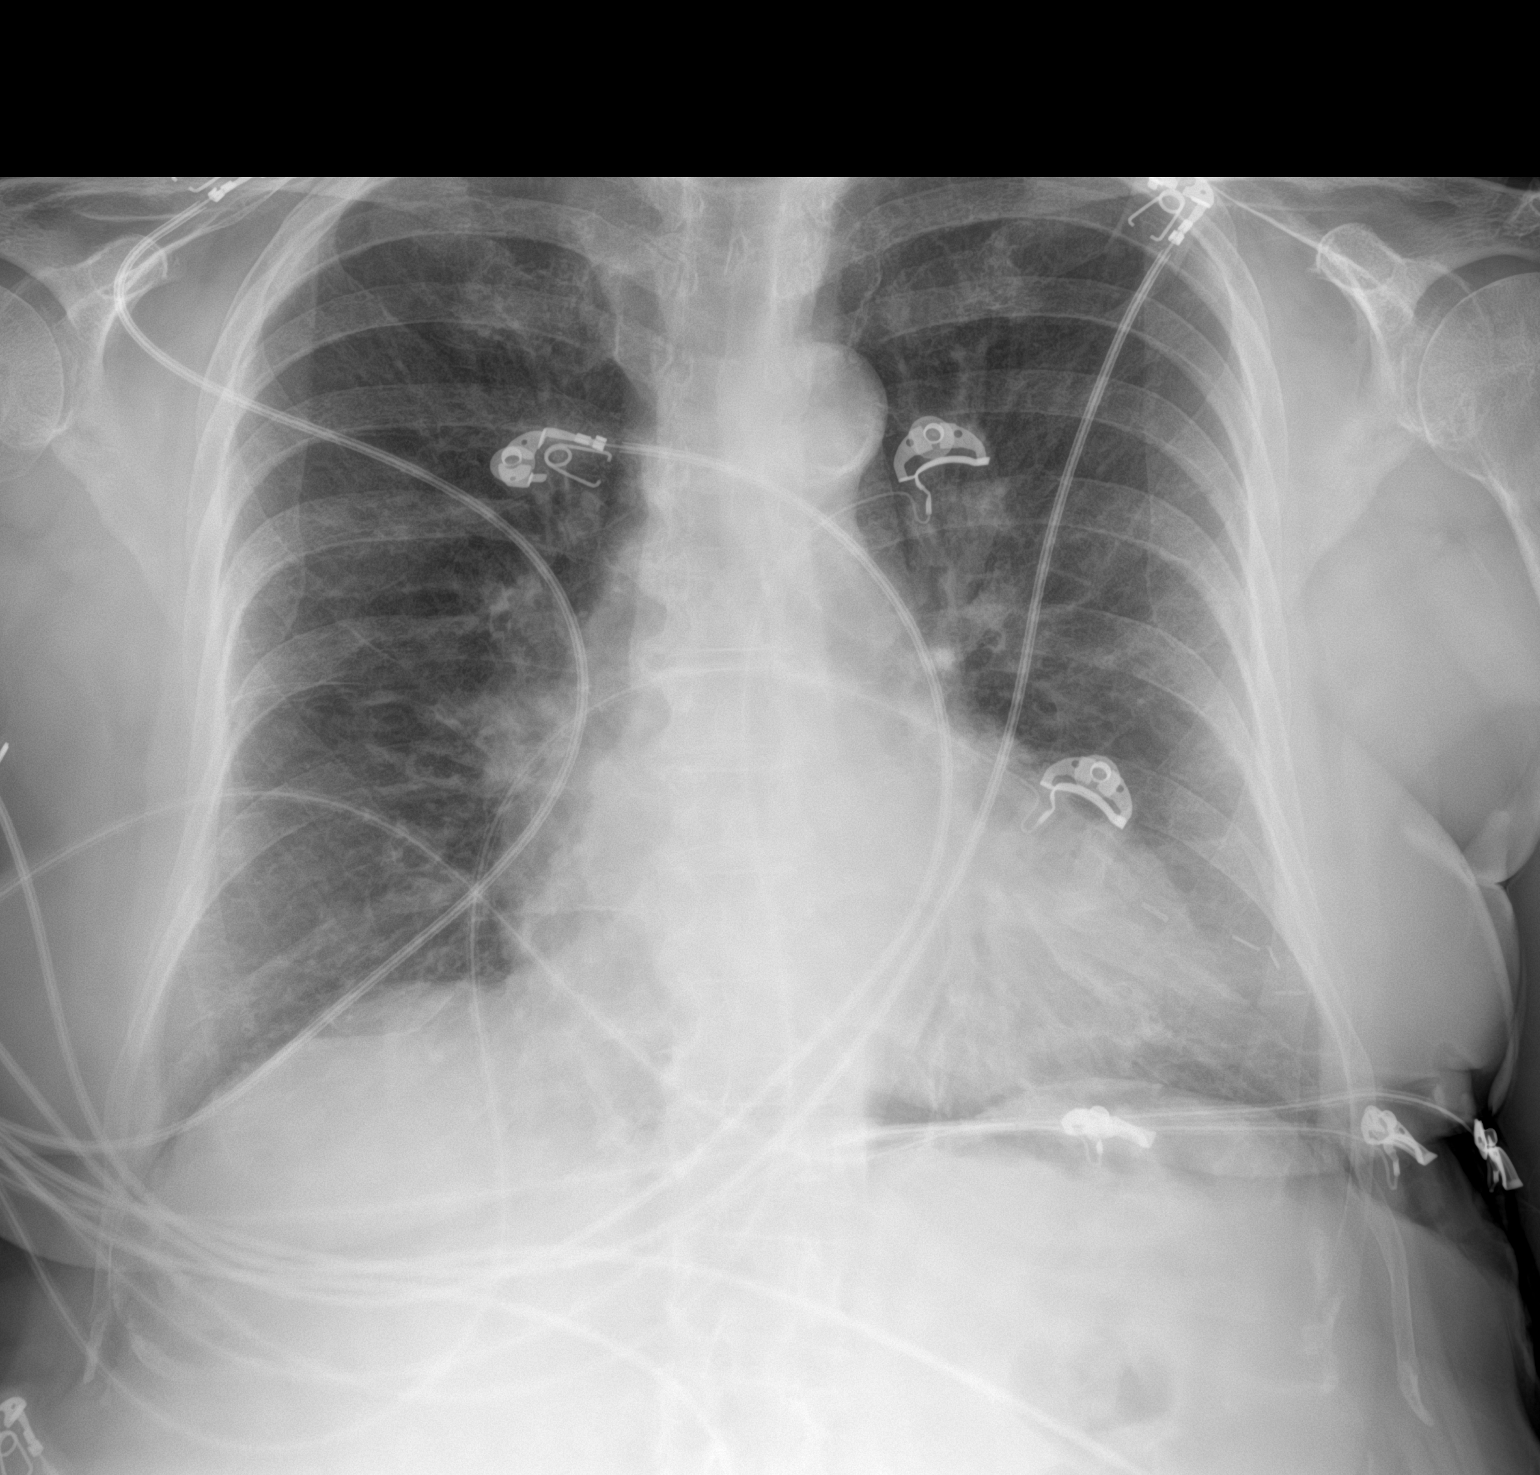

[1 of 1 positions shown; findings below may reference images not displayed]

FINDINGS: The heart size and mediastinal contours are within normal limits.
Aortic knob calcifications are seen. There is prominence of the
central pulmonary vasculature. Surgical clips are seen overlying the
left lower lung. The visualized skeletal structures are
unremarkable.
IMPRESSION: Mild pulmonary vascular congestion.

## 2022-04-05 ENCOUNTER — Telehealth (HOSPITAL_COMMUNITY): Payer: Self-pay | Admitting: *Deleted

## 2022-04-05 NOTE — Telephone Encounter (Signed)
Reaching out to patient to offer assistance regarding upcoming cardiac imaging study; pt verbalizes understanding of appt date/time, parking situation and where to check in, and verified current allergies; name and call back number provided for further questions should they arise  Gordy Clement RN Navigator Cardiac Imaging [facility] [phone] office [phone] cell  Patient to take her daily medications. She is aware to arrive at 9am.

## 2022-04-06 ENCOUNTER — Ambulatory Visit (HOSPITAL_COMMUNITY)
Admission: RE | Admit: 2022-04-06 | Discharge: 2022-04-06 | Disposition: A | Discharge status: Home / Self Care | Payer: Medicare Other | Source: Ambulatory Visit | Attending: Physician Assistant | Admitting: Physician Assistant

## 2022-04-06 DIAGNOSIS — R072 Precordial pain: Secondary | ICD-10-CM | POA: Diagnosis not present

## 2022-04-06 MED ORDER — IOHEXOL 350 MG/ML SOLN
100.0000 mL | Freq: Once | INTRAVENOUS | Status: AC | PRN
  Administered 2022-04-06: 100 mL via INTRAVENOUS

## 2022-04-06 MED ORDER — NITROGLYCERIN 0.4 MG SL SUBL
SUBLINGUAL_TABLET | SUBLINGUAL | Status: AC
  Filled 2022-04-06: qty 2

## 2022-04-06 MED ORDER — NITROGLYCERIN 0.4 MG SL SUBL
0.8000 mg | SUBLINGUAL_TABLET | Freq: Once | SUBLINGUAL | Status: AC
  Administered 2022-04-06: 0.8 mg via SUBLINGUAL

## 2022-04-29 DIAGNOSIS — L570 Actinic keratosis: Secondary | ICD-10-CM | POA: Diagnosis not present

## 2022-04-29 DIAGNOSIS — Z08 Encounter for follow-up examination after completed treatment for malignant neoplasm: Secondary | ICD-10-CM | POA: Diagnosis not present

## 2022-04-29 DIAGNOSIS — Z85828 Personal history of other malignant neoplasm of skin: Secondary | ICD-10-CM | POA: Diagnosis not present

## 2022-04-29 DIAGNOSIS — L814 Other melanin hyperpigmentation: Secondary | ICD-10-CM | POA: Diagnosis not present

## 2022-04-29 DIAGNOSIS — L821 Other seborrheic keratosis: Secondary | ICD-10-CM | POA: Diagnosis not present

## 2022-04-29 DIAGNOSIS — D225 Melanocytic nevi of trunk: Secondary | ICD-10-CM | POA: Diagnosis not present

## 2022-04-29 DIAGNOSIS — B351 Tinea unguium: Secondary | ICD-10-CM | POA: Diagnosis not present

## 2022-05-08 DIAGNOSIS — Z23 Encounter for immunization: Secondary | ICD-10-CM | POA: Diagnosis not present

## 2022-06-01 DIAGNOSIS — H11442 Conjunctival cysts, left eye: Secondary | ICD-10-CM | POA: Diagnosis not present

## 2022-06-01 DIAGNOSIS — H2513 Age-related nuclear cataract, bilateral: Secondary | ICD-10-CM | POA: Diagnosis not present

## 2022-06-01 DIAGNOSIS — H04123 Dry eye syndrome of bilateral lacrimal glands: Secondary | ICD-10-CM | POA: Diagnosis not present

## 2022-06-01 DIAGNOSIS — H524 Presbyopia: Secondary | ICD-10-CM | POA: Diagnosis not present

## 2022-06-01 DIAGNOSIS — H43811 Vitreous degeneration, right eye: Secondary | ICD-10-CM | POA: Diagnosis not present

## 2022-06-01 DIAGNOSIS — H5203 Hypermetropia, bilateral: Secondary | ICD-10-CM | POA: Diagnosis not present

## 2022-07-15 ENCOUNTER — Other Ambulatory Visit: Payer: Self-pay | Admitting: Cardiology

## 2022-07-29 DIAGNOSIS — E785 Hyperlipidemia, unspecified: Secondary | ICD-10-CM | POA: Diagnosis not present

## 2022-07-29 DIAGNOSIS — I10 Essential (primary) hypertension: Secondary | ICD-10-CM | POA: Diagnosis not present

## 2022-07-29 DIAGNOSIS — F419 Anxiety disorder, unspecified: Secondary | ICD-10-CM | POA: Diagnosis not present

## 2022-07-29 DIAGNOSIS — M8580 Other specified disorders of bone density and structure, unspecified site: Secondary | ICD-10-CM | POA: Diagnosis not present

## 2022-07-29 DIAGNOSIS — Z79899 Other long term (current) drug therapy: Secondary | ICD-10-CM | POA: Diagnosis not present

## 2022-07-29 DIAGNOSIS — R7989 Other specified abnormal findings of blood chemistry: Secondary | ICD-10-CM | POA: Diagnosis not present

## 2022-08-05 DIAGNOSIS — Z23 Encounter for immunization: Secondary | ICD-10-CM | POA: Diagnosis not present

## 2022-08-05 DIAGNOSIS — Z853 Personal history of malignant neoplasm of breast: Secondary | ICD-10-CM | POA: Diagnosis not present

## 2022-08-05 DIAGNOSIS — I2510 Atherosclerotic heart disease of native coronary artery without angina pectoris: Secondary | ICD-10-CM | POA: Diagnosis not present

## 2022-08-05 DIAGNOSIS — I872 Venous insufficiency (chronic) (peripheral): Secondary | ICD-10-CM | POA: Diagnosis not present

## 2022-08-05 DIAGNOSIS — Z0000 Encounter for general adult medical examination without abnormal findings: Secondary | ICD-10-CM | POA: Diagnosis not present

## 2022-08-05 DIAGNOSIS — J310 Chronic rhinitis: Secondary | ICD-10-CM | POA: Diagnosis not present

## 2022-08-05 DIAGNOSIS — G4700 Insomnia, unspecified: Secondary | ICD-10-CM | POA: Diagnosis not present

## 2022-08-05 DIAGNOSIS — R82998 Other abnormal findings in urine: Secondary | ICD-10-CM | POA: Diagnosis not present

## 2022-08-05 DIAGNOSIS — I700 Atherosclerosis of aorta: Secondary | ICD-10-CM | POA: Diagnosis not present

## 2022-08-05 DIAGNOSIS — I10 Essential (primary) hypertension: Secondary | ICD-10-CM | POA: Diagnosis not present

## 2022-08-05 DIAGNOSIS — Z1331 Encounter for screening for depression: Secondary | ICD-10-CM | POA: Diagnosis not present

## 2022-08-05 DIAGNOSIS — E785 Hyperlipidemia, unspecified: Secondary | ICD-10-CM | POA: Diagnosis not present

## 2022-08-05 DIAGNOSIS — Z1339 Encounter for screening examination for other mental health and behavioral disorders: Secondary | ICD-10-CM | POA: Diagnosis not present

## 2022-09-10 ENCOUNTER — Other Ambulatory Visit: Payer: Self-pay | Admitting: Hematology and Oncology

## 2022-09-10 DIAGNOSIS — Z1231 Encounter for screening mammogram for malignant neoplasm of breast: Secondary | ICD-10-CM

## 2022-09-28 ENCOUNTER — Telehealth: Payer: Self-pay

## 2022-09-28 NOTE — Telephone Encounter (Signed)
   Name: Katrina Young  DOB: 03-Nov-1940  MRN: [id number]  Primary Cardiologist: Peter Martinique, MD   Preoperative team, please contact this patient and set up a phone call appointment for further preoperative risk assessment. Please obtain consent and complete medication review. Thank you for your help.  I confirm that guidance regarding antiplatelet and oral anticoagulation therapy has been completed and, if necessary, noted below.  Her aspirin may be held for 5-7 days prior to her procedure.  Please resume as soon as hemostasis is achieved.   Deberah Pelton, NP 09/28/2022, 3:20 PM [place]

## 2022-09-28 NOTE — Telephone Encounter (Signed)
Left a message to call back for tele pre op appt

## 2022-09-28 NOTE — Telephone Encounter (Signed)
..     Pre-operative Risk Assessment    Patient Name: Katrina Young  DOB: Feb 13, 1941 MRN: [id number]      Request for Surgical Clearanceen    Procedure:   endoscopy  Date of Surgery:  Clearance 10/27/22                                 Surgeon:  DR Ronnette Juniper Surgeon's Group or Practice Name:  [facility] Phone number:  [phone] Fax number:  [phone]   Type of Clearance Requested:   - Medical  - Pharmacy:  Hold Aspirin     Type of Anesthesia:   PROPOFOL   Additional requests/questions:    Gwenlyn Found   09/28/2022, 1:54 PM

## 2022-09-29 ENCOUNTER — Telehealth: Payer: Self-pay | Admitting: *Deleted

## 2022-09-29 NOTE — Telephone Encounter (Signed)
Pt has been scheduled for tele pre op appt 10/05/22 @ 9 am. Med rec and consent are done.

## 2022-09-29 NOTE — Telephone Encounter (Signed)
Patient was returning call. Please advise  

## 2022-09-29 NOTE — Telephone Encounter (Signed)
Pt has been scheduled for tele pre op appt 10/05/22 @ 9 am. Med rec and consent are done.      Patient Consent for Virtual Visit        Katrina Young has provided verbal consent on 09/29/2022 for a virtual visit (video or telephone).   CONSENT FOR VIRTUAL VISIT FOR:  Katrina Young  By participating in this virtual visit I agree to the following:  I hereby voluntarily request, consent and authorize [facility] and its employed or contracted physicians, physician assistants, nurse practitioners or other licensed health care professionals (the Practitioner), to provide me with telemedicine health care services (the "Services") as deemed necessary by the treating Practitioner. I acknowledge and consent to receive the Services by the Practitioner via telemedicine. I understand that the telemedicine visit will involve communicating with the Practitioner through live audiovisual communication technology and the disclosure of certain medical information by electronic transmission. I acknowledge that I have been given the opportunity to request an in-person assessment or other available alternative prior to the telemedicine visit and am voluntarily participating in the telemedicine visit.  I understand that I have the right to withhold or withdraw my consent to the use of telemedicine in the course of my care at any time, without affecting my right to future care or treatment, and that the Practitioner or I may terminate the telemedicine visit at any time. I understand that I have the right to inspect all information obtained and/or recorded in the course of the telemedicine visit and may receive copies of available information for a reasonable fee.  I understand that some of the potential risks of receiving the Services via telemedicine include:  Delay or interruption in medical evaluation due to technological equipment failure or disruption; Information transmitted may not be sufficient (e.g. poor  resolution of images) to allow for appropriate medical decision making by the Practitioner; and/or  In rare instances, security protocols could fail, causing a breach of personal health information.  Furthermore, I acknowledge that it is my responsibility to provide information about my medical history, conditions and care that is complete and accurate to the best of my ability. I acknowledge that Practitioner's advice, recommendations, and/or decision may be based on factors not within their control, such as incomplete or inaccurate data provided by me or distortions of diagnostic images or specimens that may result from electronic transmissions. I understand that the practice of medicine is not an exact science and that Practitioner makes no warranties or guarantees regarding treatment outcomes. I acknowledge that a copy of this consent can be made available to me via my patient portal ([name]), or I can request a printed copy by calling the office of [facility].    I understand that my insurance will be billed for this visit.   I have read or had this consent read to me. I understand the contents of this consent, which adequately explains the benefits and risks of the Services being provided via telemedicine.  I have been provided ample opportunity to ask questions regarding this consent and the Services and have had my questions answered to my satisfaction. I give my informed consent for the services to be provided through the use of telemedicine in my medical care

## 2022-10-01 ENCOUNTER — Other Ambulatory Visit: Payer: Self-pay | Admitting: Cardiology

## 2022-10-04 NOTE — Progress Notes (Signed)
Virtual Visit via Telephone Note   Because of Katrina Young's co-morbid illnesses, she is at least at moderate risk for complications without adequate follow up.  This format is felt to be most appropriate for this patient at this time.  The patient did not have access to video technology/had technical difficulties with video requiring transitioning to audio format only (telephone).  All issues noted in this document were discussed and addressed.  No physical exam could be performed with this format.  Please refer to the patient's chart for her consent to telehealth for [facility].  Evaluation Performed:  Preoperative cardiovascular risk assessment _____________   Date:  10/04/2022   Patient ID:  Katrina Young, DOB 1941-04-10, MRN [id number] Patient Location:  Home Provider location:   Office  Primary Care Provider:  Garlan Fillers, MD Primary Cardiologist:  Peter Swazil[place], MD  Chief Complaint / Patient Profile   82 y.o. y/o female with a h/o nonobstructive CAD, HTN, HLD, breast CA, ovarian cancer who is pending endoscopy procedure and presents today for telephonic preoperative cardiovascular risk assessment.  History of Present Illness    Katrina Young is a 82 y.o. female who presents via audio/video conferencing for a telehealth visit today.  Pt was last seen in cardiology clinic on 03/08/2022 by Azalee Course, PA.  At that time Katrina Young was experiencing some intermittent chest discomfort and underwent cardiac CTA that revealed very low calcium score and chest pain was deemed noncardiac.  The patient is now pending procedure as outlined above. Since her last visit, she reports that she is been doing fine with no new cardiac complaints.  She does endorse a sore like pain in the middle of her breast where she had radiation therapy previously.  She denied any radiating pain up her neck or down her arms and states that the pain is not reproduced with activity.  She is active daily and  walks up and down stairs from her basement and endorses no shortness of breath or chest discomfort.  She denies  shortness of breath, lower extremity edema, fatigue, palpitations, melena, hematuria, hemoptysis, diaphoresis, weakness, presyncope, syncope, orthopnea, and PND.    Past Medical History    Past Medical History:  Diagnosis Date   Arthritis    legs, back. neck   Breast cancer (HCC)    Breast cancer of lower-outer quadrant of left female breast (HCC) 12/11/2015   Cancer (HCC)    ovarian cancer (hysterectomy-12-14 years ago) no txs   Complication of anesthesia    Constipation    Diminished eyesight change in eyesight   cataracts   Family history of breast cancer    Family history of colon cancer    Family history of melanoma    Family history of prostate cancer    GERD (gastroesophageal reflux disease)    Hearing difficulty change in hearing   Hyperlipidemia    Hypertension    Joint pain    Leg pain    Personal history of radiation therapy    PONV (postoperative nausea and vomiting)    Reflux    Shortness of breath dyspnea    increased walking or climbing stairs   Varicose veins    Weight gain    Past Surgical History:  Procedure Laterality Date   ABDOMINAL HYSTERECTOMY     total hx of ovarian cancer    BLADDER SURGERY     BREAST BIOPSY Left 12/09/2015   BREAST EXCISIONAL BIOPSY Right  over 20 years ago   BREAST LUMPECTOMY Left 12/2015   BREAST SURGERY  30-40 years   hx of removal of calcum build up -right breast    CESAREAN SECTION  1966, 1968, 1973   CHOLECYSTECTOMY     colonscopy      ENDOVENOUS ABLATION SAPHENOUS VEIN W/ LASER  12/30/2011   left greater saphenous vein   EVLA  R  GSV  02-10-2011   EVLA   RIGHT SMALL SAPHENOUS VEIN 09-13-2007   LEFT HEART CATH AND CORONARY ANGIOGRAPHY N/A 09/22/2020   Procedure: LEFT HEART CATH AND CORONARY ANGIOGRAPHY;  Surgeon: Corky Crafts, MD;  Location: [facility];  Service: Cardiovascular;   Laterality: N/A;   RADIOACTIVE SEED GUIDED PARTIAL MASTECTOMY WITH AXILLARY SENTINEL LYMPH NODE BIOPSY Left 01/22/2016   Procedure: LEFT BREAST RADIOACTIVE SEED GUIDED PARTIAL MASTECTOMY WITH AXILLARY SENTINEL LYMPH NODE BIOPSY;  Surgeon: Harriette Bouillon, MD;  Location: [facility];  Service: General;  Laterality: Left;   SHOULDER SURGERY     bilat - rotaor cuff repair secondary to tear   TONSILLECTOMY     VENTRAL HERNIA REPAIR N/A 07/23/2014   Procedure: VENTRAL INCISIONAL HERNIA REPAIR WITH MESH;  Surgeon: Darnell Level, MD;  Location: [facility];  Service: General;  Laterality: N/A;    Allergies  Allergies  Allergen Reactions   Codeine Nausea Only   Morphine And Related Nausea Only    Home Medications    Prior to Admission medications   Medication Sig Start Date End Date Taking? Authorizing Provider  anastrozole (ARIMIDEX) 1 MG tablet TAKE 1 TABLET BY MOUTH EVERY DAY 01/25/22   Serena Croissant, MD  aspirin EC 81 MG tablet Take 81 mg by mouth daily.    [provider]  atenolol (TENORMIN) 100 MG tablet Take 1 tablet (100 mg total) by mouth every morning. 02/03/18   Serena Croissant, MD  Calcium-Vitamin D-Vitamin K (CALCIUM SOFT CHEWS PO) Take by mouth.    [provider]  chlorthalidone (HYGROTON) 25 MG tablet Take 1 tablet (25 mg total) by mouth daily. 03/04/22 02/27/23  [place], Peter M, MD  cholecalciferol (VITAMIN D) 1000 UNITS tablet Take 1,000 Units by mouth daily. Patient not taking: Reported on 09/29/2022    [provider]  cholecalciferol (VITAMIN D3) 25 MCG (1000 UNIT) tablet Take 1,000 Units by mouth daily.    [provider]  esomeprazole (NEXIUM) 40 MG capsule Take 40 mg by mouth daily before breakfast. Patient not taking: Reported on 09/29/2022    [provider]  LORazepam (ATIVAN) 1 MG tablet Take 1 mg by mouth daily.    [provider]  losartan (COZAAR) 100 MG tablet TAKE 1 TABLET BY MOUTH EVERY DAY 10/01/22   Azalee Course,  PA  metoprolol tartrate (LOPRESSOR) 25 MG tablet Take tablet by mouth 2 hours before Coronary CT Patient not taking: Reported on 09/29/2022 03/08/22   Azalee Course, PA  Misc Natural Products (OSTEO BI-FLEX/5-LOXIN ADVANCED PO) Take by mouth.    [provider]  Multiple Vitamin (MULTIVITAMIN WITH MINERALS) TABS tablet Take 1 tablet by mouth daily.    [provider]  Phenazopyridine HCl (AZO TABS PO) Take 2 tablets by mouth daily. Pt takes as needed when she has a flare-up    [provider]  pravastatin (PRAVACHOL) 40 MG tablet Take 1 tablet (40 mg total) by mouth daily. 07/16/22   [place], Peter M, MD  Probiotic Product (PROBIOTIC PO) Take 1 tablet by mouth once as needed (  indigestion).    [provider]    Physical Exam    Vital Signs:  Sidney Ace does not have vital signs available for review today.  Given telephonic nature of communication, physical exam is limited. AAOx3. NAD. Normal affect.  Speech and respirations are unlabored.  Accessory Clinical Findings    None  Assessment & Plan    1.  Preoperative Cardiovascular Risk Assessment:   Ms. Lowenthal's perioperative risk of a major cardiac event is 0.4% according to the Revised Cardiac Risk Index (RCRI).  Therefore, she is at low risk for perioperative complications.   Her functional capacity is fair at 4.31 METs according to the [facility] Activity Status Index (DASI). Recommendations: According to ACC/AHA guidelines, no further cardiovascular testing needed.  The patient may proceed to surgery at acceptable risk.   Antiplatelet and/or Anticoagulation Recommendations: Aspirin can be held for 5-7 days prior to her surgery.  Please resume Aspirin post operatively when it is felt to be safe from a bleeding standpoint.    The patient was advised that if she develops new symptoms prior to surgery to contact our office to arrange for a follow-up visit, and she verbalized understanding.   A copy of this note  will be routed to requesting surgeon.  Time:   Today, I have spent 8 minutes with the patient with telehealth technology discussing medical history, symptoms, and management plan.     Napoleon Form, Leodis Rains, NP  10/04/2022, 12:36 PM

## 2022-10-05 ENCOUNTER — Ambulatory Visit: Payer: Medicare Other | Attending: Cardiovascular Disease

## 2022-10-05 DIAGNOSIS — Z01810 Encounter for preprocedural cardiovascular examination: Secondary | ICD-10-CM

## 2022-10-18 ENCOUNTER — Other Ambulatory Visit: Payer: Self-pay | Admitting: Otolaryngology

## 2022-10-18 DIAGNOSIS — H9042 Sensorineural hearing loss, unilateral, left ear, with unrestricted hearing on the contralateral side: Secondary | ICD-10-CM

## 2022-10-18 DIAGNOSIS — H903 Sensorineural hearing loss, bilateral: Secondary | ICD-10-CM | POA: Diagnosis not present

## 2022-10-22 ENCOUNTER — Ambulatory Visit
Admission: RE | Admit: 2022-10-22 | Discharge: 2022-10-22 | Disposition: A | Discharge status: Home / Self Care | Payer: Medicare Other | Source: Ambulatory Visit | Attending: Hematology and Oncology | Admitting: Hematology and Oncology

## 2022-10-22 DIAGNOSIS — Z1231 Encounter for screening mammogram for malignant neoplasm of breast: Secondary | ICD-10-CM

## 2022-10-26 DIAGNOSIS — R051 Acute cough: Secondary | ICD-10-CM | POA: Diagnosis not present

## 2022-10-26 DIAGNOSIS — R5383 Other fatigue: Secondary | ICD-10-CM | POA: Diagnosis not present

## 2022-10-26 DIAGNOSIS — H1032 Unspecified acute conjunctivitis, left eye: Secondary | ICD-10-CM | POA: Diagnosis not present

## 2022-10-26 DIAGNOSIS — R070 Pain in throat: Secondary | ICD-10-CM | POA: Diagnosis not present

## 2022-10-26 DIAGNOSIS — J0100 Acute maxillary sinusitis, unspecified: Secondary | ICD-10-CM | POA: Diagnosis not present

## 2022-10-26 DIAGNOSIS — Z1152 Encounter for screening for COVID-19: Secondary | ICD-10-CM | POA: Diagnosis not present

## 2022-11-03 DIAGNOSIS — R197 Diarrhea, unspecified: Secondary | ICD-10-CM | POA: Diagnosis not present

## 2022-11-16 ENCOUNTER — Ambulatory Visit
Admission: RE | Admit: 2022-11-16 | Discharge: 2022-11-16 | Disposition: A | Discharge status: Home / Self Care | Payer: Medicare Other | Source: Ambulatory Visit | Attending: Otolaryngology | Admitting: Otolaryngology

## 2022-11-16 DIAGNOSIS — H9042 Sensorineural hearing loss, unilateral, left ear, with unrestricted hearing on the contralateral side: Secondary | ICD-10-CM

## 2022-11-16 MED ORDER — GADOPICLENOL 0.5 MMOL/ML IV SOLN
7.5000 mL | Freq: Once | INTRAVENOUS | Status: AC | PRN
  Administered 2022-11-16: 7.5 mL via INTRAVENOUS

## 2022-11-23 DIAGNOSIS — R1310 Dysphagia, unspecified: Secondary | ICD-10-CM | POA: Diagnosis not present

## 2022-11-23 DIAGNOSIS — K449 Diaphragmatic hernia without obstruction or gangrene: Secondary | ICD-10-CM | POA: Diagnosis not present

## 2022-11-23 DIAGNOSIS — K222 Esophageal obstruction: Secondary | ICD-10-CM | POA: Diagnosis not present

## 2022-11-23 DIAGNOSIS — K3189 Other diseases of stomach and duodenum: Secondary | ICD-10-CM | POA: Diagnosis not present

## 2022-11-23 DIAGNOSIS — K2930 Chronic superficial gastritis without bleeding: Secondary | ICD-10-CM | POA: Diagnosis not present

## 2022-11-23 DIAGNOSIS — K219 Gastro-esophageal reflux disease without esophagitis: Secondary | ICD-10-CM | POA: Diagnosis not present

## 2022-12-02 DIAGNOSIS — L218 Other seborrheic dermatitis: Secondary | ICD-10-CM | POA: Diagnosis not present

## 2022-12-02 DIAGNOSIS — D492 Neoplasm of unspecified behavior of bone, soft tissue, and skin: Secondary | ICD-10-CM | POA: Diagnosis not present

## 2022-12-02 DIAGNOSIS — C44321 Squamous cell carcinoma of skin of nose: Secondary | ICD-10-CM | POA: Diagnosis not present

## 2022-12-30 ENCOUNTER — Other Ambulatory Visit: Payer: Self-pay | Admitting: Physician Assistant

## 2022-12-30 ENCOUNTER — Other Ambulatory Visit: Payer: Self-pay | Admitting: Cardiology

## 2023-01-05 DIAGNOSIS — L820 Inflamed seborrheic keratosis: Secondary | ICD-10-CM | POA: Diagnosis not present

## 2023-01-05 DIAGNOSIS — D485 Neoplasm of uncertain behavior of skin: Secondary | ICD-10-CM | POA: Diagnosis not present

## 2023-01-05 DIAGNOSIS — C44321 Squamous cell carcinoma of skin of nose: Secondary | ICD-10-CM | POA: Diagnosis not present

## 2023-01-28 DIAGNOSIS — N390 Urinary tract infection, site not specified: Secondary | ICD-10-CM | POA: Diagnosis not present

## 2023-02-13 NOTE — Progress Notes (Unsigned)
Patient Care Team: Garlan Fillers, MD as PCP - General (Internal Medicine) [place], Peter M, MD as PCP - Cardiology (Cardiology) Serena Croissant, MD as Consulting Physician (Oncology) Harriette Bouillon, MD as Consulting Physician (General Surgery) Lurline Hare, MD as Consulting Physician (Radiation Oncology)  DIAGNOSIS: No diagnosis found.  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 Genes analyzed: APC, ATM, AXIN2, BAP1, BARD1, BMPR1A, BRCA1, BRCA2, BRIP1, CDH1, CDK4, CDKN2A, CHEK2, EPCAM, FANCC, MITF, MLH1, MSH2, MSH6, MUTYH, NBN, PALB2, PMS2, POLD1, POLE, PTEN, RAD51C, RAD51D, SCG5/GREM1, SMAD4, STK11, TP53, VHL, and XRCC2   01/22/2016 Surgery   Left lumpectomy ([name]): IDC grade 1, 2.1 cm, with intermediate grade DCIS, LVI present, PNI present, 0/2 lymph nodes negative, ER 95%, PR 90%, HER-2 negative ratio 1.28, Ki-67 15%, T2 N0 stage II a, Oncotype 23, 15% ROR   01/22/2016 Oncotype testing   Recurrence score: 23; ROR 15% (intermediate risk)    03/11/2016 - 04/07/2016 Radiation Therapy   Adjuvant radiation therapy Mitzi Hansen). Left breast: 42.5 Gy in 17 treatments. Left breast "boost": 7.5 Gy in 3 treatments.    04/2016 -  Anti-estrogen oral therapy   Anastrozole 1 mg daily. Planned duration of therapy: 5 years.      CHIEF COMPLIANT: Follow-up of left breast cancer on anastrozole   INTERVAL HISTORY: Katrina Young is a 82 y.o. with above-mentioned history of  left breast cancer treated with lumpectomy, radiation, and who is currently on anastrozole therapy. She presents to the clinic today for an annual follow-up.    ALLERGIES:  is allergic to  codeine and morphine and codeine.  MEDICATIONS:  Current Outpatient Medications  Medication Sig Dispense Refill   anastrozole (ARIMIDEX) 1 MG tablet TAKE 1 TABLET BY MOUTH EVERY DAY 90 tablet 3   aspirin EC 81 MG tablet Take 81 mg by mouth daily.     atenolol (TENORMIN) 100 MG tablet Take 1 tablet (100 mg total) by mouth every morning.     Calcium-Vitamin D-Vitamin K (CALCIUM SOFT CHEWS PO) Take by mouth.     chlorthalidone (HYGROTON) 25 MG tablet TAKE 1 TABLET (25 MG TOTAL) BY MOUTH DAILY. 90 tablet 3   cholecalciferol (VITAMIN D) 1000 UNITS tablet Take 1,000 Units by mouth daily. (Patient not taking: Reported on 09/29/2022)     cholecalciferol (VITAMIN D3) 25 MCG (1000 UNIT) tablet Take 1,000 Units by mouth daily.     esomeprazole (NEXIUM) 40 MG capsule Take 40 mg by mouth daily before breakfast. (Patient not taking: Reported on 09/29/2022)     LORazepam (ATIVAN) 1 MG tablet Take 1 mg by mouth daily.     losartan (COZAAR) 100 MG tablet TAKE 1 TABLET BY MOUTH EVERY DAY 90 tablet 2   metoprolol tartrate (LOPRESSOR) 25 MG tablet Take tablet by mouth 2 hours before Coronary CT (Patient not taking: Reported on 09/29/2022) 1 tablet 0   Misc Natural Products (OSTEO BI-FLEX/5-LOXIN ADVANCED PO) Take by mouth.     Multiple Vitamin (MULTIVITAMIN WITH MINERALS) TABS tablet Take 1 tablet by mouth daily.     Phenazopyridine HCl (AZO TABS PO) Take 2 tablets by mouth daily. Pt takes as needed when she has  a flare-up     pravastatin (PRAVACHOL) 40 MG tablet Take 1 tablet (40 mg total) by mouth daily. 90 tablet 3   Probiotic Product (PROBIOTIC PO) Take 1 tablet by mouth once as needed (indigestion).     No current facility-administered medications for this visit.    PHYSICAL EXAMINATION: ECOG PERFORMANCE STATUS: {CHL ONC ECOG [phone]}  There were no vitals filed for this visit. There were no vitals filed for this visit.  BREAST:*** No palpable masses or nodules in either right or left breasts. No  palpable axillary supraclavicular or infraclavicular adenopathy no breast tenderness or nipple discharge. (exam performed in the presence of a chaperone)  LABORATORY DATA:  I have reviewed the data as listed    Latest Ref Rng & Units 03/15/2022    9:20 AM 09/22/2020    4:27 AM 09/21/2020   12:49 AM  CMP  Glucose 70 - 99 mg/dL 94  756  433   BUN 8 - 27 mg/dL 25  15  15    Creatinine 0.57 - 1.00 mg/dL 2.95  1.88  4.16   Sodium 134 - 144 mmol/L 138  141  142   Potassium 3.5 - 5.2 mmol/L 4.6  3.8  3.9   Chloride 96 - 106 mmol/L 99  107  107   CO2 20 - 29 mmol/L 29  24  25    Calcium 8.7 - 10.3 mg/dL 60.6  9.3  9.4   Total Protein 6.5 - 8.1 g/dL  5.9    Total Bilirubin 0.3 - 1.2 mg/dL  0.8    Alkaline Phos 38 - 126 U/L  100    AST 15 - 41 U/L  20    ALT 0 - 44 U/L  22      Lab Results  Component Value Date   WBC 7.8 09/22/2020   HGB 12.3 09/22/2020   HCT 34.8 (L) 09/22/2020   MCV 85.1 09/22/2020   PLT 176 09/22/2020   NEUTROABS 2.8 01/19/2016    ASSESSMENT & PLAN:  No problem-specific Assessment & Plan notes found for this encounter.    No orders of the defined types were placed in this encounter.  The patient has a good understanding of the overall plan. she agrees with it. she will call with any problems that may develop before the next visit here. Total time spent: 30 mins including face to face time and time spent for planning, charting and co-ordination of care   Sherlyn Lick, CMA 02/13/23    I Janan Ridge am acting as a Neurosurgeon for [employer]  ***

## 2023-02-15 ENCOUNTER — Inpatient Hospital Stay: Payer: Medicare Other | Attending: Hematology and Oncology | Admitting: Hematology and Oncology

## 2023-02-15 ENCOUNTER — Telehealth: Payer: Self-pay

## 2023-02-15 ENCOUNTER — Other Ambulatory Visit: Payer: Self-pay

## 2023-02-15 VITALS — BP 143/48 | HR 56 | Temp 97.3°F | Resp 18 | Ht 63.5 in | Wt 161.8 lb

## 2023-02-15 DIAGNOSIS — Z79899 Other long term (current) drug therapy: Secondary | ICD-10-CM | POA: Diagnosis not present

## 2023-02-15 DIAGNOSIS — Z170 Estrogen receptor positive status [ER+]: Secondary | ICD-10-CM | POA: Diagnosis not present

## 2023-02-15 DIAGNOSIS — Z923 Personal history of irradiation: Secondary | ICD-10-CM | POA: Diagnosis not present

## 2023-02-15 DIAGNOSIS — C50512 Malignant neoplasm of lower-outer quadrant of left female breast: Secondary | ICD-10-CM | POA: Diagnosis not present

## 2023-02-15 DIAGNOSIS — Z79811 Long term (current) use of aromatase inhibitors: Secondary | ICD-10-CM | POA: Insufficient documentation

## 2023-02-15 DIAGNOSIS — Z7982 Long term (current) use of aspirin: Secondary | ICD-10-CM | POA: Insufficient documentation

## 2023-02-15 MED ORDER — ANASTROZOLE 1 MG PO TABS: 1.0000 mg | ORAL_TABLET | Freq: Every day | ORAL | 3 refills | Status: DC

## 2023-02-15 NOTE — Assessment & Plan Note (Addendum)
Left lumpectomy 01/22/2016: IDC grade 1, 2.1 cm, with intermediate grade DCIS, LVI present, PNI present, 0/2 lymph nodes negative, ER 95%, PR 90%, HER-2 negative ratio 1.28, Ki-67 15%, T2 N0 stage II a Oncotype DX score 23, 15% risk of recurrence   Adjuvant radiation therapy from 03/11/2016 to 04/07/2016   Treatment plan: Anastrozole 1 mg by mouth daily started 04/2016   Anastrozole Toxicities: Muscle cramping in the fingers has improved Patient plans to continue anastrozole beyond 7 years.   Surveillance: 1. Mammogram 10/25/2022: Benign breast density category C 2.  Brain MRI 11/20/2022: Benign   [facility]  We will review that to decide if she needs any bisphosphonate therapy.  Her niece Amaal Dimartino is also a patient of mine. Return to clinic in 1 year for follow-up

## 2023-02-15 NOTE — Telephone Encounter (Signed)
Called Pt regarding bone density report. Per MD, report shows osteopenia so biophosphonate therapy is not necessary. Advised Pt on taking vitamin d, calcium, and weight bearing exercises. Pt verbalized understanding.

## 2023-02-16 ENCOUNTER — Encounter: Payer: Self-pay | Admitting: Obstetrics and Gynecology

## 2023-03-26 ENCOUNTER — Other Ambulatory Visit: Payer: Self-pay | Admitting: Hematology and Oncology

## 2023-04-06 DIAGNOSIS — M545 Low back pain, unspecified: Secondary | ICD-10-CM | POA: Diagnosis not present

## 2023-05-03 DIAGNOSIS — M25562 Pain in left knee: Secondary | ICD-10-CM | POA: Diagnosis not present

## 2023-05-03 DIAGNOSIS — L570 Actinic keratosis: Secondary | ICD-10-CM | POA: Diagnosis not present

## 2023-05-03 DIAGNOSIS — D225 Melanocytic nevi of trunk: Secondary | ICD-10-CM | POA: Diagnosis not present

## 2023-05-03 DIAGNOSIS — I8391 Asymptomatic varicose veins of right lower extremity: Secondary | ICD-10-CM | POA: Diagnosis not present

## 2023-05-03 DIAGNOSIS — L821 Other seborrheic keratosis: Secondary | ICD-10-CM | POA: Diagnosis not present

## 2023-05-03 DIAGNOSIS — L814 Other melanin hyperpigmentation: Secondary | ICD-10-CM | POA: Diagnosis not present

## 2023-05-03 DIAGNOSIS — I872 Venous insufficiency (chronic) (peripheral): Secondary | ICD-10-CM | POA: Diagnosis not present

## 2023-05-03 DIAGNOSIS — Z08 Encounter for follow-up examination after completed treatment for malignant neoplasm: Secondary | ICD-10-CM | POA: Diagnosis not present

## 2023-05-03 DIAGNOSIS — Z85828 Personal history of other malignant neoplasm of skin: Secondary | ICD-10-CM | POA: Diagnosis not present

## 2023-05-06 ENCOUNTER — Ambulatory Visit (HOSPITAL_COMMUNITY)
Admission: RE | Admit: 2023-05-06 | Discharge: 2023-05-06 | Disposition: A | Discharge status: Home / Self Care | Payer: Medicare Other | Source: Ambulatory Visit | Attending: Vascular Surgery | Admitting: Vascular Surgery

## 2023-05-06 ENCOUNTER — Other Ambulatory Visit: Payer: Self-pay | Admitting: Nurse Practitioner

## 2023-05-06 DIAGNOSIS — I83891 Varicose veins of right lower extremities with other complications: Secondary | ICD-10-CM

## 2023-05-06 DIAGNOSIS — M79604 Pain in right leg: Secondary | ICD-10-CM

## 2023-05-06 DIAGNOSIS — I872 Venous insufficiency (chronic) (peripheral): Secondary | ICD-10-CM | POA: Diagnosis not present

## 2023-05-06 DIAGNOSIS — I10 Essential (primary) hypertension: Secondary | ICD-10-CM | POA: Diagnosis not present

## 2023-05-07 DIAGNOSIS — Z23 Encounter for immunization: Secondary | ICD-10-CM | POA: Diagnosis not present

## 2023-05-31 DIAGNOSIS — J300 Vasomotor rhinitis: Secondary | ICD-10-CM | POA: Diagnosis not present

## 2023-05-31 DIAGNOSIS — H903 Sensorineural hearing loss, bilateral: Secondary | ICD-10-CM | POA: Diagnosis not present

## 2023-06-06 DIAGNOSIS — H43811 Vitreous degeneration, right eye: Secondary | ICD-10-CM | POA: Diagnosis not present

## 2023-06-06 DIAGNOSIS — H2513 Age-related nuclear cataract, bilateral: Secondary | ICD-10-CM | POA: Diagnosis not present

## 2023-06-06 DIAGNOSIS — H35373 Puckering of macula, bilateral: Secondary | ICD-10-CM | POA: Diagnosis not present

## 2023-06-06 DIAGNOSIS — H04123 Dry eye syndrome of bilateral lacrimal glands: Secondary | ICD-10-CM | POA: Diagnosis not present

## 2023-06-06 DIAGNOSIS — H524 Presbyopia: Secondary | ICD-10-CM | POA: Diagnosis not present

## 2023-06-06 DIAGNOSIS — H5203 Hypermetropia, bilateral: Secondary | ICD-10-CM | POA: Diagnosis not present

## 2023-06-29 ENCOUNTER — Other Ambulatory Visit: Payer: Self-pay | Admitting: Cardiology

## 2023-08-30 ENCOUNTER — Other Ambulatory Visit: Payer: Self-pay | Admitting: Hematology and Oncology

## 2023-08-30 DIAGNOSIS — Z1231 Encounter for screening mammogram for malignant neoplasm of breast: Secondary | ICD-10-CM

## 2023-08-31 ENCOUNTER — Telehealth: Payer: Self-pay | Admitting: *Deleted

## 2023-08-31 NOTE — Telephone Encounter (Signed)
 Received call from pt with complaint of left breast discomfort and occasional nipple discharge.  Pt states symptoms have been intermittent over the last year and requesting appt with provider for breast exam and possible recommendations for breast imaging. [facility] visit scheduled, pt verbalize understanding of appt details.

## 2023-09-05 ENCOUNTER — Encounter: Payer: Self-pay | Admitting: Adult Health

## 2023-09-05 ENCOUNTER — Inpatient Hospital Stay: Payer: Medicare Other | Attending: Adult Health | Admitting: Adult Health

## 2023-09-05 VITALS — BP 166/76 | HR 58 | Temp 97.7°F | Resp 18 | Ht 63.5 in | Wt 169.6 lb

## 2023-09-05 DIAGNOSIS — K432 Incisional hernia without obstruction or gangrene: Secondary | ICD-10-CM | POA: Diagnosis not present

## 2023-09-05 DIAGNOSIS — Z800 Family history of malignant neoplasm of digestive organs: Secondary | ICD-10-CM | POA: Diagnosis not present

## 2023-09-05 DIAGNOSIS — Z8042 Family history of malignant neoplasm of prostate: Secondary | ICD-10-CM | POA: Diagnosis not present

## 2023-09-05 DIAGNOSIS — I252 Old myocardial infarction: Secondary | ICD-10-CM | POA: Diagnosis not present

## 2023-09-05 DIAGNOSIS — M25562 Pain in left knee: Secondary | ICD-10-CM | POA: Diagnosis not present

## 2023-09-05 DIAGNOSIS — Z803 Family history of malignant neoplasm of breast: Secondary | ICD-10-CM | POA: Diagnosis not present

## 2023-09-05 DIAGNOSIS — Z79811 Long term (current) use of aromatase inhibitors: Secondary | ICD-10-CM | POA: Diagnosis not present

## 2023-09-05 DIAGNOSIS — Z806 Family history of leukemia: Secondary | ICD-10-CM | POA: Insufficient documentation

## 2023-09-05 DIAGNOSIS — Z170 Estrogen receptor positive status [ER+]: Secondary | ICD-10-CM | POA: Insufficient documentation

## 2023-09-05 DIAGNOSIS — C50512 Malignant neoplasm of lower-outer quadrant of left female breast: Secondary | ICD-10-CM | POA: Diagnosis not present

## 2023-09-05 DIAGNOSIS — M25552 Pain in left hip: Secondary | ICD-10-CM | POA: Diagnosis not present

## 2023-09-05 DIAGNOSIS — K219 Gastro-esophageal reflux disease without esophagitis: Secondary | ICD-10-CM | POA: Diagnosis not present

## 2023-09-05 DIAGNOSIS — Z87891 Personal history of nicotine dependence: Secondary | ICD-10-CM | POA: Insufficient documentation

## 2023-09-05 DIAGNOSIS — M25551 Pain in right hip: Secondary | ICD-10-CM | POA: Diagnosis not present

## 2023-09-05 DIAGNOSIS — M25561 Pain in right knee: Secondary | ICD-10-CM | POA: Insufficient documentation

## 2023-09-05 DIAGNOSIS — Z923 Personal history of irradiation: Secondary | ICD-10-CM | POA: Insufficient documentation

## 2023-09-05 DIAGNOSIS — Z808 Family history of malignant neoplasm of other organs or systems: Secondary | ICD-10-CM | POA: Diagnosis not present

## 2023-09-05 DIAGNOSIS — E785 Hyperlipidemia, unspecified: Secondary | ICD-10-CM | POA: Diagnosis not present

## 2023-09-05 DIAGNOSIS — D500 Iron deficiency anemia secondary to blood loss (chronic): Secondary | ICD-10-CM | POA: Diagnosis not present

## 2023-09-05 DIAGNOSIS — I10 Essential (primary) hypertension: Secondary | ICD-10-CM | POA: Insufficient documentation

## 2023-09-05 NOTE — Assessment & Plan Note (Signed)
 Left lumpectomy 01/22/2016: IDC grade 1, 2.1 cm, with intermediate grade DCIS, LVI present, PNI present, 0/2 lymph nodes negative, ER 95%, PR 90%, HER-2 negative ratio 1.28, Ki-67 15%, T2 N0 stage II a Oncotype DX score 23, 15% risk of recurrence   Adjuvant radiation therapy from 03/11/2016 to 04/07/2016   Treatment plan: Anastrozole  1 mg by mouth daily started 04/2016    Breast Cancer, Stage 1A Survivor since 2017, on Anastrozole . Recent history of breast discharge, no current pain or nodules. Scar tissue from surgery causing discomfort. -Order mammogram and ultrasound due to history of discharge. -Continue Anastrozole  daily. -If discharge recurs, patient to contact office for expedited imaging.  Bone Health On Anastrozole , which can decrease bone density. Patient's bone density is slightly decreased in right femoral neck, but overall good for age and medication status. -Continue current activity level. -Consider calcium  supplementation if not getting enough from diet. -Follow-up with bone density every 2 years.  Follow-up Appointment with Dr. Lee Public in July 2025. -Keep appointment for review of mammogram and Anastrozole  management.

## 2023-09-05 NOTE — Progress Notes (Signed)
 [facility] Cancer Follow up:    Bertha Broad, MD [address] [place] [address]   DIAGNOSIS:  Cancer Staging  Breast cancer of lower-outer quadrant of left female breast [facility]) Staging form: Breast, AJCC 7th Edition - Clinical stage from 12/17/2015: Stage IA (T1c, N0, M0) - Signed by Cameron Cea, MD on 12/17/2015 Staged by: Managing physician Specimen type: Core Needle Biopsy Laterality: Left Tumor size (mm): 17 Histologic grade (G): G2 Estrogen receptor status: Positive Progesterone receptor status: Positive HER2 status: Negative Stage used in treatment planning: Yes National guidelines used in treatment planning: Yes Type of national guideline used in treatment planning: NCCN - Pathologic stage from 01/22/2016: Stage IIA (T2, N0, cM0) - Signed by Alanna Alley, NP on 06/22/2016 Laterality: Left Estrogen receptor status: Positive Progesterone receptor status: Positive HER2 status: Negative   SUMMARY OF ONCOLOGIC HISTORY: Oncology History  Breast cancer of lower-outer quadrant of left female breast (HCC)  12/11/2015 Initial Diagnosis   Left breast mass at 3:00: 1.7 x 1.6 x 1.2 cm, axilla negative, grade 2 IDC with DCIS, ER/PR positive HER-2 negative Ki-67 15%, T1 cN0 stage IA clinical stage   01/02/2016 Procedure   Genetic testing: VUS on POLD1 gene called c.3989G>A. Genes analyzed: APC, ATM, AXIN2, BAP1, BARD1, BMPR1A, BRCA1, BRCA2, BRIP1, CDH1, CDK4, CDKN2A, CHEK2, EPCAM, FANCC, MITF, MLH1, MSH2, MSH6, MUTYH, NBN, PALB2, PMS2, POLD1, POLE, PTEN, RAD51C, RAD51D, SCG5/GREM1, SMAD4, STK11, TP53, VHL, and XRCC2   01/22/2016 Surgery   Left lumpectomy ([name]): IDC grade 1, 2.1 cm, with intermediate grade DCIS, LVI present, PNI present, 0/2 lymph nodes negative, ER 95%, PR 90%, HER-2 negative ratio 1.28, Ki-67 15%, T2 N0 stage II a, Oncotype 23, 15% ROR   01/22/2016 Oncotype testing   Recurrence score: 23; ROR 15% (intermediate risk)    03/11/2016 -  04/07/2016 Radiation Therapy   Adjuvant radiation therapy Jeryl Moris). Left breast: 42.5 Gy in 17 treatments. Left breast "boost": 7.5 Gy in 3 treatments.    04/2016 -  Anti-estrogen oral therapy   Anastrozole  1 mg daily. Planned duration of therapy: 5 years.      CURRENT THERAPY: anastrozole   INTERVAL HISTORY:  Discussed the use of AI scribe software for clinical note transcription with the patient, who gave verbal consent to proceed.  Katrina Young 83 y.o. female a stage 1A breast cancer survivor diagnosed in 2017, presents with a history of breast discharge. She reports that several months ago, she noticed a moist ball-like discharge from her breast. She denies any breast pain or nodules, but mentions discomfort from scar tissue related to her previous surgery. She has been taking anastrozole  daily since her diagnosis and reports no adverse effects. She is aware of the potential for decreased bone density with anastrozole  use, but her recent bone density tests have been satisfactory. She also mentions occasional pain in her knees and hips, which she attributes to arthritis.   Patient Active Problem List   Diagnosis Date Noted   Iron deficiency anemia due to chronic blood loss 02/09/2022   Shortness of breath    Chest pain 09/20/2020   Unstable angina (HCC) 09/20/2020   Hypertension, uncontrolled 09/20/2020   LBBB (left bundle branch block) 09/20/2020   NSTEMI (non-ST elevated myocardial infarction) (HCC) 09/20/2020   Genetic testing 01/05/2016   Ovarian cancer (HCC) 12/17/2015   Family history of prostate cancer    Family history of breast cancer    Family history of colon cancer    Family history  of melanoma    Breast cancer of lower-outer quadrant of left female breast (HCC) 12/11/2015   Ventral incisional hernia 07/22/2014   Pain in limb 12/28/2011   Varicose veins of bilateral lower extremities with other complications 09/23/2011   Leg pain 02/02/2011   Varicose veins of right  lower extremity with pain 02/02/2011    is allergic to codeine and morphine and codeine.  MEDICAL HISTORY: Past Medical History:  Diagnosis Date   Arthritis    legs, back. neck   Breast cancer (HCC)    Breast cancer of lower-outer quadrant of left female breast (HCC) 12/11/2015   Cancer (HCC)    ovarian cancer (hysterectomy-12-14 years ago) no txs   Complication of anesthesia    Constipation    Diminished eyesight change in eyesight   cataracts   Family history of breast cancer    Family history of colon cancer    Family history of melanoma    Family history of prostate cancer    GERD (gastroesophageal reflux disease)    Hearing difficulty change in hearing   Hyperlipidemia    Hypertension    Joint pain    Leg pain    Personal history of radiation therapy    PONV (postoperative nausea and vomiting)    Reflux    Shortness of breath dyspnea    increased walking or climbing stairs   Varicose veins    Weight gain     SURGICAL HISTORY: Past Surgical History:  Procedure Laterality Date   ABDOMINAL HYSTERECTOMY     total hx of ovarian cancer    BLADDER SURGERY     BREAST BIOPSY Left 12/09/2015   BREAST EXCISIONAL BIOPSY Right    over 20 years ago   BREAST LUMPECTOMY Left 12/2015   BREAST SURGERY  30-40 years   hx of removal of calcum build up -right breast    CESAREAN SECTION  1966, 1968, 1973   CHOLECYSTECTOMY     colonscopy      ENDOVENOUS ABLATION SAPHENOUS VEIN W/ LASER  12/30/2011   left greater saphenous vein   EVLA  R  GSV  02-10-2011   EVLA   RIGHT SMALL SAPHENOUS VEIN 09-13-2007   LEFT HEART CATH AND CORONARY ANGIOGRAPHY N/A 09/22/2020   Procedure: LEFT HEART CATH AND CORONARY ANGIOGRAPHY;  Surgeon: Lucendia Rusk, MD;  Location: [facility];  Service: Cardiovascular;  Laterality: N/A;   RADIOACTIVE SEED GUIDED PARTIAL MASTECTOMY WITH AXILLARY SENTINEL LYMPH NODE BIOPSY Left 01/22/2016   Procedure: LEFT BREAST RADIOACTIVE SEED GUIDED PARTIAL  MASTECTOMY WITH AXILLARY SENTINEL LYMPH NODE BIOPSY;  Surgeon: Sim Dryer, MD;  Location: [facility];  Service: General;  Laterality: Left;   SHOULDER SURGERY     bilat - rotaor cuff repair secondary to tear   TONSILLECTOMY     VENTRAL HERNIA REPAIR N/A 07/23/2014   Procedure: VENTRAL INCISIONAL HERNIA REPAIR WITH MESH;  Surgeon: Oralee Billow, MD;  Location: [facility];  Service: General;  Laterality: N/A;    SOCIAL HISTORY: Social History   Socioeconomic History   Marital status: Married    Spouse name: Josefina Nian   Number of children: 2   Years of education: Not on file   Highest education level: Not on file  Occupational History   Not on file  Tobacco Use   Smoking status: Former    Current packs/day: 0.00    Types: Cigarettes    Start date: 07/26/1964    Quit date: 07/26/1966  Years since quitting: 57.1   Smokeless tobacco: Never  Substance and Sexual Activity   Alcohol  use: No   Drug use: No   Sexual activity: Not on file  Other Topics Concern   Not on file  Social History Narrative   Not on file   Social Drivers of Health   Financial Resource Strain: Not on file  Food Insecurity: Not on file  Transportation Needs: Not on file  Physical Activity: Not on file  Stress: Not on file  Social Connections: Not on file  Intimate Partner Violence: Not on file    FAMILY HISTORY: Family History  Problem Relation Age of Onset   Heart disease Mother    Hypertension Father    Heart disease Father    Breast cancer Sister 71   Leukemia Brother 11   Melanoma Brother 25       on his chest   Prostate cancer Brother 18   Colon cancer Sister 63   Melanoma Brother 54   Diabetes Sister    Heart disease Sister    Prostate cancer Brother 43   Stroke Brother    Cancer Other        unknown - died quickly   Breast cancer Cousin        dx 35-40 yrs, maternal first cousin   Dementia Cousin     Review of Systems  Constitutional:  Negative for appetite change,  chills, fatigue, fever and unexpected weight change.  HENT:   Negative for hearing loss, lump/mass and trouble swallowing.   Eyes:  Negative for eye problems and icterus.  Respiratory:  Negative for chest tightness, cough and shortness of breath.   Cardiovascular:  Negative for chest pain, leg swelling and palpitations.  Gastrointestinal:  Negative for abdominal distention, abdominal pain, constipation, diarrhea, nausea and vomiting.  Endocrine: Negative for hot flashes.  Genitourinary:  Negative for difficulty urinating.   Musculoskeletal:  Negative for arthralgias.  Skin:  Negative for itching and rash.  Neurological:  Negative for dizziness, extremity weakness, headaches and numbness.  Hematological:  Negative for adenopathy. Does not bruise/bleed easily.  Psychiatric/Behavioral:  Negative for depression. The patient is not nervous/anxious.       PHYSICAL EXAMINATION    Vitals:   09/05/23 0855  BP: (!) 166/76  Pulse: (!) 58  Resp: 18  Temp: 97.7 F (36.5 C)  SpO2: 97%    Physical Exam Constitutional:      General: She is not in acute distress.    Appearance: Normal appearance. She is not toxic-appearing.  HENT:     Head: Normocephalic and atraumatic.     Mouth/Throat:     Mouth: Mucous membranes are moist.     Pharynx: Oropharynx is clear. No oropharyngeal exudate or posterior oropharyngeal erythema.  Eyes:     General: No scleral icterus. Cardiovascular:     Rate and Rhythm: Normal rate and regular rhythm.     Pulses: Normal pulses.     Heart sounds: Normal heart sounds.  Pulmonary:     Effort: Pulmonary effort is normal.     Breath sounds: Normal breath sounds.  Chest:     Comments: Right breast benign, left breast s/p lumpectomy and radiation, no discharge noted from nipple, no nodules, masses or signs of breast cancer recurrence noted Abdominal:     General: Abdomen is flat. Bowel sounds are normal. There is no distension.     Palpations: Abdomen is soft.      Tenderness: There is no abdominal  tenderness.  Musculoskeletal:        General: No swelling.     Cervical back: Neck supple.  Lymphadenopathy:     Cervical: No cervical adenopathy.     Upper Body:     Right upper body: No axillary adenopathy.     Left upper body: No axillary adenopathy.  Skin:    General: Skin is warm and dry.     Findings: No rash.  Neurological:     General: No focal deficit present.     Mental Status: She is alert.  Psychiatric:        Mood and Affect: Mood normal.        Behavior: Behavior normal.       ASSESSMENT and THERAPY PLAN:   Breast cancer of lower-outer quadrant of left female breast (HCC) Left lumpectomy 01/22/2016: IDC grade 1, 2.1 cm, with intermediate grade DCIS, LVI present, PNI present, 0/2 lymph nodes negative, ER 95%, PR 90%, HER-2 negative ratio 1.28, Ki-67 15%, T2 N0 stage II a Oncotype DX score 23, 15% risk of recurrence   Adjuvant radiation therapy from 03/11/2016 to 04/07/2016   Treatment plan: Anastrozole  1 mg by mouth daily started 04/2016    Breast Cancer, Stage 1A Survivor since 2017, on Anastrozole . Recent history of breast discharge, no current pain or nodules. Scar tissue from surgery causing discomfort. -Order mammogram and ultrasound due to history of discharge. -Continue Anastrozole  daily. -If discharge recurs, patient to contact office for expedited imaging.  Bone Health On Anastrozole , which can decrease bone density. Patient's bone density is slightly decreased in right femoral neck, but overall good for age and medication status. -Continue current activity level. -Consider calcium  supplementation if not getting enough from diet. -Follow-up with bone density every 2 years.  Follow-up Appointment with Dr. Lee Public in July 2025. -Keep appointment for review of mammogram and Anastrozole  management.   All questions were answered. The patient knows to call the clinic with any problems, questions or concerns. We can  certainly see the patient much sooner if necessary.  Total encounter time:20 minutes*in face-to-face visit time, chart review, lab review, care coordination, order entry, and documentation of the encounter time.  Alwin Baars, NP 09/05/23 11:48 AM Medical Oncology and Hematology [facility] [address] [place], [place] [address] Tel. [phone]    Fax. [phone]  *Total Encounter Time as defined by the Centers for Medicare and Medicaid Services includes, in addition to the face-to-face time of a patient visit (documented in the note above) non-face-to-face time: obtaining and reviewing outside history, ordering and reviewing medications, tests or procedures, care coordination (communications with other health care professionals or caregivers) and documentation in the medical record.

## 2023-09-06 DIAGNOSIS — I10 Essential (primary) hypertension: Secondary | ICD-10-CM | POA: Diagnosis not present

## 2023-09-06 DIAGNOSIS — M8580 Other specified disorders of bone density and structure, unspecified site: Secondary | ICD-10-CM | POA: Diagnosis not present

## 2023-09-06 DIAGNOSIS — E785 Hyperlipidemia, unspecified: Secondary | ICD-10-CM | POA: Diagnosis not present

## 2023-09-06 DIAGNOSIS — I2510 Atherosclerotic heart disease of native coronary artery without angina pectoris: Secondary | ICD-10-CM | POA: Diagnosis not present

## 2023-09-13 DIAGNOSIS — R82998 Other abnormal findings in urine: Secondary | ICD-10-CM | POA: Diagnosis not present

## 2023-09-13 DIAGNOSIS — Z0000 Encounter for general adult medical examination without abnormal findings: Secondary | ICD-10-CM | POA: Diagnosis not present

## 2023-09-13 DIAGNOSIS — I872 Venous insufficiency (chronic) (peripheral): Secondary | ICD-10-CM | POA: Diagnosis not present

## 2023-09-13 DIAGNOSIS — Z853 Personal history of malignant neoplasm of breast: Secondary | ICD-10-CM | POA: Diagnosis not present

## 2023-09-13 DIAGNOSIS — I10 Essential (primary) hypertension: Secondary | ICD-10-CM | POA: Diagnosis not present

## 2023-09-13 DIAGNOSIS — E559 Vitamin D deficiency, unspecified: Secondary | ICD-10-CM | POA: Diagnosis not present

## 2023-09-13 DIAGNOSIS — E785 Hyperlipidemia, unspecified: Secondary | ICD-10-CM | POA: Diagnosis not present

## 2023-09-13 DIAGNOSIS — K219 Gastro-esophageal reflux disease without esophagitis: Secondary | ICD-10-CM | POA: Diagnosis not present

## 2023-09-13 DIAGNOSIS — I2510 Atherosclerotic heart disease of native coronary artery without angina pectoris: Secondary | ICD-10-CM | POA: Diagnosis not present

## 2023-09-28 ENCOUNTER — Other Ambulatory Visit: Payer: Self-pay | Admitting: Physician Assistant

## 2023-09-28 ENCOUNTER — Other Ambulatory Visit: Payer: Self-pay | Admitting: Cardiology

## 2023-10-18 DIAGNOSIS — Z01419 Encounter for gynecological examination (general) (routine) without abnormal findings: Secondary | ICD-10-CM | POA: Diagnosis not present

## 2023-10-18 DIAGNOSIS — N952 Postmenopausal atrophic vaginitis: Secondary | ICD-10-CM | POA: Diagnosis not present

## 2023-10-18 DIAGNOSIS — Z6829 Body mass index (BMI) 29.0-29.9, adult: Secondary | ICD-10-CM | POA: Diagnosis not present

## 2023-10-18 DIAGNOSIS — M8580 Other specified disorders of bone density and structure, unspecified site: Secondary | ICD-10-CM | POA: Diagnosis not present

## 2023-10-25 ENCOUNTER — Other Ambulatory Visit: Payer: Self-pay | Admitting: Adult Health

## 2023-10-25 ENCOUNTER — Ambulatory Visit
Admission: RE | Admit: 2023-10-25 | Discharge: 2023-10-25 | Disposition: A | Discharge status: Home / Self Care | Source: Ambulatory Visit | Attending: Adult Health | Admitting: Adult Health

## 2023-10-25 ENCOUNTER — Ambulatory Visit
Admission: RE | Admit: 2023-10-25 | Discharge: 2023-10-25 | Disposition: A | Discharge status: Home / Self Care | Payer: Medicare Other | Source: Ambulatory Visit | Attending: Adult Health | Admitting: Adult Health

## 2023-10-25 DIAGNOSIS — Z170 Estrogen receptor positive status [ER+]: Secondary | ICD-10-CM

## 2023-10-25 DIAGNOSIS — N6452 Nipple discharge: Secondary | ICD-10-CM | POA: Diagnosis not present

## 2023-11-01 DIAGNOSIS — D225 Melanocytic nevi of trunk: Secondary | ICD-10-CM | POA: Diagnosis not present

## 2023-11-01 DIAGNOSIS — L570 Actinic keratosis: Secondary | ICD-10-CM | POA: Diagnosis not present

## 2023-11-01 DIAGNOSIS — Z08 Encounter for follow-up examination after completed treatment for malignant neoplasm: Secondary | ICD-10-CM | POA: Diagnosis not present

## 2023-11-01 DIAGNOSIS — L814 Other melanin hyperpigmentation: Secondary | ICD-10-CM | POA: Diagnosis not present

## 2023-11-01 DIAGNOSIS — Z85828 Personal history of other malignant neoplasm of skin: Secondary | ICD-10-CM | POA: Diagnosis not present

## 2023-11-01 DIAGNOSIS — L821 Other seborrheic keratosis: Secondary | ICD-10-CM | POA: Diagnosis not present

## 2023-11-28 ENCOUNTER — Other Ambulatory Visit: Payer: Self-pay | Admitting: Cardiology

## 2023-12-21 ENCOUNTER — Other Ambulatory Visit: Payer: Self-pay

## 2023-12-21 DIAGNOSIS — Z170 Estrogen receptor positive status [ER+]: Secondary | ICD-10-CM

## 2023-12-21 NOTE — Progress Notes (Signed)
 Pt called to f/u on role for bilat breast MRI w/wo contrast per radiology recommendation. Order placed per MD and email sent to scheduling coordinator at the breast center [place]. Pt is aware and knows she should receive a call within a week to get scheduled.

## 2023-12-23 ENCOUNTER — Telehealth: Payer: Self-pay

## 2023-12-23 NOTE — Telephone Encounter (Signed)
 Received email X2 from [facility] attempting to call pt to schedule MRI. This nurse called pt's daughter, Katrina Young and she states Katrina Young is out of town and will return Sunday. She is asking if we can have them call Monday to schedule. Email sent to request schedulers at [facility] call pt Monday.

## 2023-12-28 ENCOUNTER — Other Ambulatory Visit: Payer: Self-pay | Admitting: Cardiology

## 2024-01-04 ENCOUNTER — Ambulatory Visit
Admission: RE | Admit: 2024-01-04 | Discharge: 2024-01-04 | Disposition: A | Discharge status: Home / Self Care | Source: Ambulatory Visit | Attending: Hematology and Oncology | Admitting: Hematology and Oncology

## 2024-01-04 ENCOUNTER — Ambulatory Visit: Payer: Self-pay | Admitting: Hematology and Oncology

## 2024-01-04 DIAGNOSIS — Z170 Estrogen receptor positive status [ER+]: Secondary | ICD-10-CM

## 2024-01-04 DIAGNOSIS — N6452 Nipple discharge: Secondary | ICD-10-CM | POA: Diagnosis not present

## 2024-01-04 DIAGNOSIS — Z853 Personal history of malignant neoplasm of breast: Secondary | ICD-10-CM | POA: Diagnosis not present

## 2024-01-04 MED ORDER — GADOPICLENOL 0.5 MMOL/ML IV SOLN
7.5000 mL | Freq: Once | INTRAVENOUS | Status: AC | PRN
  Administered 2024-01-04: 7.5 mL via INTRAVENOUS

## 2024-01-04 NOTE — Telephone Encounter (Signed)
 I left a  voicemail that breast MRI was normal

## 2024-01-07 ENCOUNTER — Other Ambulatory Visit: Payer: Self-pay | Admitting: Cardiology

## 2024-01-24 ENCOUNTER — Other Ambulatory Visit: Payer: Self-pay | Admitting: Cardiology

## 2024-02-10 DIAGNOSIS — N390 Urinary tract infection, site not specified: Secondary | ICD-10-CM | POA: Diagnosis not present

## 2024-02-10 DIAGNOSIS — N3001 Acute cystitis with hematuria: Secondary | ICD-10-CM | POA: Diagnosis not present

## 2024-02-10 DIAGNOSIS — R310 Gross hematuria: Secondary | ICD-10-CM | POA: Diagnosis not present

## 2024-02-10 DIAGNOSIS — I10 Essential (primary) hypertension: Secondary | ICD-10-CM | POA: Diagnosis not present

## 2024-02-16 DIAGNOSIS — R310 Gross hematuria: Secondary | ICD-10-CM | POA: Diagnosis not present

## 2024-02-16 DIAGNOSIS — R350 Frequency of micturition: Secondary | ICD-10-CM | POA: Diagnosis not present

## 2024-02-16 DIAGNOSIS — N3020 Other chronic cystitis without hematuria: Secondary | ICD-10-CM | POA: Diagnosis not present

## 2024-02-21 ENCOUNTER — Ambulatory Visit (HOSPITAL_COMMUNITY)
Admission: RE | Admit: 2024-02-21 | Discharge: 2024-02-21 | Disposition: A | Discharge status: Home / Self Care | Source: Ambulatory Visit | Attending: Hematology and Oncology | Admitting: Hematology and Oncology

## 2024-02-21 ENCOUNTER — Inpatient Hospital Stay: Payer: Medicare Other | Attending: Hematology and Oncology | Admitting: Hematology and Oncology

## 2024-02-21 VITALS — BP 128/62 | HR 53 | Temp 97.2°F | Resp 16 | Ht 63.5 in | Wt 161.5 lb

## 2024-02-21 DIAGNOSIS — C50512 Malignant neoplasm of lower-outer quadrant of left female breast: Secondary | ICD-10-CM | POA: Diagnosis present

## 2024-02-21 DIAGNOSIS — Z170 Estrogen receptor positive status [ER+]: Secondary | ICD-10-CM | POA: Insufficient documentation

## 2024-02-21 DIAGNOSIS — M25562 Pain in left knee: Secondary | ICD-10-CM | POA: Diagnosis not present

## 2024-02-21 DIAGNOSIS — M8580 Other specified disorders of bone density and structure, unspecified site: Secondary | ICD-10-CM | POA: Insufficient documentation

## 2024-02-21 DIAGNOSIS — M25569 Pain in unspecified knee: Secondary | ICD-10-CM | POA: Insufficient documentation

## 2024-02-21 DIAGNOSIS — Z79811 Long term (current) use of aromatase inhibitors: Secondary | ICD-10-CM | POA: Diagnosis not present

## 2024-02-21 DIAGNOSIS — M1712 Unilateral primary osteoarthritis, left knee: Secondary | ICD-10-CM | POA: Diagnosis not present

## 2024-02-21 DIAGNOSIS — M7989 Other specified soft tissue disorders: Secondary | ICD-10-CM | POA: Diagnosis not present

## 2024-02-21 MED ORDER — ANASTROZOLE 1 MG PO TABS: 1.0000 mg | ORAL_TABLET | Freq: Every day | ORAL | 3 refills | Status: AC

## 2024-02-21 NOTE — Assessment & Plan Note (Signed)
 Left lumpectomy 01/22/2016: IDC grade 1, 2.1 cm, with intermediate grade DCIS, LVI present, PNI present, 0/2 lymph nodes negative, ER 95%, PR 90%, HER-2 negative ratio 1.28, Ki-67 15%, T2 N0 stage II a Oncotype DX score 23, 15% risk of recurrence   Adjuvant radiation therapy from 03/11/2016 to 04/07/2016   Treatment plan: Anastrozole  1 mg by mouth daily started 04/2016   Anastrozole  Toxicities: Muscle cramping in the fingers has improved Patient plans to continue anastrozole  beyond 7 years.   Surveillance: 1. Mammogram 10/25/2023: Benign breast density category C 2.  Brain MRI 11/20/2022: Benign 3.  Breast exam 02/12/2024: Benign 4.  Breast MRI 01/04/2024: Benign breast density category C   [facility].  We will review that to decide if she needs any bisphosphonate therapy. 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   Her niece Adylene Dlugosz is also a patient of mine. Return to clinic in 1 year for follow-up

## 2024-02-21 NOTE — Progress Notes (Signed)
 Patient Care Team: Yolande Toribio MATSU, MD as PCP - General (Internal Medicine) [place], Peter M, MD as PCP - Cardiology (Cardiology) Odean Potts, MD as Consulting Physician (Oncology) Vanderbilt Ned, MD as Consulting Physician (General Surgery) Keenan Hastings, MD as Consulting Physician (Radiation Oncology)  DIAGNOSIS:  Encounter Diagnosis  Name Primary?   Malignant neoplasm of lower-outer quadrant of left breast of female, estrogen receptor positive (HCC) Yes    SUMMARY OF ONCOLOGIC HISTORY: Oncology History  Breast cancer of lower-outer quadrant of left female breast (HCC)  12/11/2015 Initial Diagnosis   Left breast mass at 3:00: 1.7 x 1.6 x 1.2 cm, axilla negative, grade 2 IDC with DCIS, ER/PR positive HER-2 negative Ki-67 15%, T1 cN0 stage IA clinical stage   01/02/2016 Procedure   Genetic testing: VUS on POLD1 gene called c.3989G>A. Genes analyzed: APC, ATM, AXIN2, BAP1, BARD1, BMPR1A, BRCA1, BRCA2, BRIP1, CDH1, CDK4, CDKN2A, CHEK2, EPCAM, FANCC, MITF, MLH1, MSH2, MSH6, MUTYH, NBN, PALB2, PMS2, POLD1, POLE, PTEN, RAD51C, RAD51D, SCG5/GREM1, SMAD4, STK11, TP53, VHL, and XRCC2   01/22/2016 Surgery   Left lumpectomy ([name]): IDC grade 1, 2.1 cm, with intermediate grade DCIS, LVI present, PNI present, 0/2 lymph nodes negative, ER 95%, PR 90%, HER-2 negative ratio 1.28, Ki-67 15%, T2 N0 stage II a, Oncotype 23, 15% ROR   01/22/2016 Oncotype testing   Recurrence score: 23; ROR 15% (intermediate risk)    03/11/2016 - 04/07/2016 Radiation Therapy   Adjuvant radiation therapy Valene). Left breast: 42.5 Gy in 17 treatments. Left breast boost: 7.5 Gy in 3 treatments.    04/2016 -  Anti-estrogen oral therapy   Anastrozole  1 mg daily. Planned duration of therapy: 5 years.      CHIEF COMPLIANT:   HISTORY OF PRESENT ILLNESS: Follow-up for anastrozole , complaining of left knee pain  History of Present Illness Katrina Young is an 83 year old female with breast cancer who presents for  follow-up regarding her medication and recent fall.  She has been on anastrozole  for eight years following her breast cancer diagnosis in 2017, with no significant side effects such as hot flashes or joint stiffness. Her last bone density test indicated osteopenia, particularly in her back.  She experienced a fall last Tuesday while standing still, resulting in knee pain. She has been using a brace for support and applying an ice-like ointment to alleviate the pain, which is still present today. The pain was an eight out of ten this morning but is seventy-five percent better than it was.     ALLERGIES:  is allergic to codeine and morphine and codeine.  MEDICATIONS:  Current Outpatient Medications  Medication Sig Dispense Refill   anastrozole  (ARIMIDEX ) 1 MG tablet TAKE 1 TABLET BY MOUTH EVERY DAY 90 tablet 3   aspirin  EC 81 MG tablet Take 81 mg by mouth daily.     atenolol  (TENORMIN ) 100 MG tablet Take 1 tablet (100 mg total) by mouth every morning.     Calcium -Vitamin D -Vitamin K (CALCIUM  SOFT CHEWS PO) Take by mouth.     chlorthalidone  (HYGROTON ) 25 MG tablet TAKE 1 TABLET (25 MG TOTAL) BY MOUTH DAILY. 30 tablet 0   cholecalciferol  (VITAMIN D3) 25 MCG (1000 UNIT) tablet Take 1,000 Units by mouth daily.     esomeprazole (NEXIUM) 20 MG capsule Take 20 mg by mouth.     LORazepam  (ATIVAN ) 1 MG tablet Take 1 mg by mouth daily.     losartan  (COZAAR ) 100 MG tablet TAKE 1 TABLET BY MOUTH EVERY DAY 7 tablet 0  Misc Natural Products (OSTEO BI-FLEX/5-LOXIN ADVANCED PO) Take by mouth.     Multiple Vitamin (MULTIVITAMIN WITH MINERALS) TABS tablet Take 1 tablet by mouth daily.     NEXIUM 40 MG capsule Take 40 mg by mouth.     Phenazopyridine HCl (AZO TABS PO) Take 2 tablets by mouth daily. Pt takes as needed when she has a flare-up     pravastatin  (PRAVACHOL ) 40 MG tablet Take 1 tablet (40 mg total) by mouth daily. Patient needs an appointment for further refills. 3 rd/final attempt 15 tablet 0    Probiotic Product (PROBIOTIC PO) Take 1 tablet by mouth once as needed (indigestion).     No current facility-administered medications for this visit.    PHYSICAL EXAMINATION: ECOG PERFORMANCE STATUS: 1 - Symptomatic but completely ambulatory  Vitals:   02/21/24 1034  BP: 128/62  Pulse: (!) 53  Resp: 16  Temp: (!) 97.2 F (36.2 C)  SpO2: 99%   Filed Weights   02/21/24 1034  Weight: 161 lb 8 oz (73.3 kg)      LABORATORY DATA:  I have reviewed the data as listed    Latest Ref Rng & Units 03/15/2022    9:20 AM 09/22/2020    4:27 AM 09/21/2020   12:49 AM  CMP  Glucose 70 - 99 mg/dL 94  885  874   BUN 8 - 27 mg/dL 25  15  15    Creatinine 0.57 - 1.00 mg/dL 9.08  9.18  9.18   Sodium 134 - 144 mmol/L 138  141  142   Potassium 3.5 - 5.2 mmol/L 4.6  3.8  3.9   Chloride 96 - 106 mmol/L 99  107  107   CO2 20 - 29 mmol/L 29  24  25    Calcium  8.7 - 10.3 mg/dL 89.6  9.3  9.4   Total Protein 6.5 - 8.1 g/dL  5.9    Total Bilirubin 0.3 - 1.2 mg/dL  0.8    Alkaline Phos 38 - 126 U/L  100    AST 15 - 41 U/L  20    ALT 0 - 44 U/L  22      Lab Results  Component Value Date   WBC 7.8 09/22/2020   HGB 12.3 09/22/2020   HCT 34.8 (L) 09/22/2020   MCV 85.1 09/22/2020   PLT 176 09/22/2020   NEUTROABS 2.8 01/19/2016    ASSESSMENT & PLAN:  Breast cancer of lower-outer quadrant of left female breast (HCC) Left lumpectomy 01/22/2016: IDC grade 1, 2.1 cm, with intermediate grade DCIS, LVI present, PNI present, 0/2 lymph nodes negative, ER 95%, PR 90%, HER-2 negative ratio 1.28, Ki-67 15%, T2 N0 stage II a Oncotype DX score 23, 15% risk of recurrence   Adjuvant radiation therapy from 03/11/2016 to 04/07/2016   Treatment plan: Anastrozole  1 mg by mouth daily started 04/2016   Anastrozole  Toxicities: Muscle cramping in the fingers has improved Patient plans to continue anastrozole  beyond 7 years.  In fact she does not want to stop taking anastrozole .   Surveillance: 1. Mammogram  10/25/2023: Benign breast density category C 2.  Brain MRI 11/20/2022: Benign 3.  Breast MRI 01/04/2024: Benign breast density category C   Left knee pain after recent fall: Will obtain x-ray today.  Her neighbor is Dr. Theotis and we will send the x-ray for him to take look at. 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   Her niece Chiquita Heckert  is also a patient of mine. Return to clinic in 1 year for follow-up ------------------------------------- Assessment and Plan Assessment & Plan Estrogen receptor positive breast cancer, status post anastrozole  therapy On anastrozole  for 8 years without significant side effects. Prefers continuation due to personal concerns about recurrence. Osteopenia is a concern due to bone density loss. - Continue anastrozole  therapy. - Monitor bone density regularly.  Osteopenia Osteopenia likely due to long-term anastrozole  use. No fractures but increased fracture risk. - Continue regular bone density monitoring.  Knee pain and injury Recent knee injury with persistent pain. No fractures detected. Possible ligament injury not confirmed. - Order knee x-ray. - Send x-ray results to Dr. Dorian for evaluation. - Advise continued use of ice and supportive brace.      No orders of the defined types were placed in this encounter.  The patient has a good understanding of the overall plan. she agrees with it. she will call with any problems that may develop before the next visit here. Total time spent: 30 mins including face to face time and time spent for planning, charting and co-ordination of care   Naomi MARLA Chad, MD 02/21/24

## 2024-03-12 DIAGNOSIS — R310 Gross hematuria: Secondary | ICD-10-CM | POA: Diagnosis not present

## 2024-04-19 DIAGNOSIS — H0288A Meibomian gland dysfunction right eye, upper and lower eyelids: Secondary | ICD-10-CM | POA: Diagnosis not present

## 2024-04-19 DIAGNOSIS — H52223 Regular astigmatism, bilateral: Secondary | ICD-10-CM | POA: Diagnosis not present

## 2024-04-19 DIAGNOSIS — H524 Presbyopia: Secondary | ICD-10-CM | POA: Diagnosis not present

## 2024-04-19 DIAGNOSIS — H2513 Age-related nuclear cataract, bilateral: Secondary | ICD-10-CM | POA: Diagnosis not present

## 2024-04-19 DIAGNOSIS — H5203 Hypermetropia, bilateral: Secondary | ICD-10-CM | POA: Diagnosis not present

## 2024-04-19 DIAGNOSIS — H0288B Meibomian gland dysfunction left eye, upper and lower eyelids: Secondary | ICD-10-CM | POA: Diagnosis not present

## 2024-05-08 DIAGNOSIS — K219 Gastro-esophageal reflux disease without esophagitis: Secondary | ICD-10-CM | POA: Diagnosis not present

## 2024-05-08 DIAGNOSIS — Z86018 Personal history of other benign neoplasm: Secondary | ICD-10-CM | POA: Diagnosis not present

## 2024-05-08 DIAGNOSIS — R1310 Dysphagia, unspecified: Secondary | ICD-10-CM | POA: Diagnosis not present

## 2024-05-08 DIAGNOSIS — K5900 Constipation, unspecified: Secondary | ICD-10-CM | POA: Diagnosis not present

## 2024-05-12 DIAGNOSIS — Z23 Encounter for immunization: Secondary | ICD-10-CM | POA: Diagnosis not present

## 2024-06-26 DIAGNOSIS — H903 Sensorineural hearing loss, bilateral: Secondary | ICD-10-CM | POA: Diagnosis not present

## 2024-06-26 DIAGNOSIS — R040 Epistaxis: Secondary | ICD-10-CM | POA: Diagnosis not present

## 2024-06-26 DIAGNOSIS — H6123 Impacted cerumen, bilateral: Secondary | ICD-10-CM | POA: Diagnosis not present

## 2024-06-26 DIAGNOSIS — J300 Vasomotor rhinitis: Secondary | ICD-10-CM | POA: Diagnosis not present

## 2024-07-04 DIAGNOSIS — L814 Other melanin hyperpigmentation: Secondary | ICD-10-CM | POA: Diagnosis not present

## 2024-07-04 DIAGNOSIS — D492 Neoplasm of unspecified behavior of bone, soft tissue, and skin: Secondary | ICD-10-CM | POA: Diagnosis not present

## 2024-07-04 DIAGNOSIS — L821 Other seborrheic keratosis: Secondary | ICD-10-CM | POA: Diagnosis not present

## 2024-07-04 DIAGNOSIS — L570 Actinic keratosis: Secondary | ICD-10-CM | POA: Diagnosis not present

## 2024-07-04 DIAGNOSIS — D225 Melanocytic nevi of trunk: Secondary | ICD-10-CM | POA: Diagnosis not present

## 2025-02-21 ENCOUNTER — Ambulatory Visit: Admitting: Hematology and Oncology
